# Patient Record
Sex: Female | Born: 1935 | Race: White | Hispanic: No | Smoking: Current every day smoker
Health system: Southern US, Community
[De-identification: ages and names within clinical notes are randomized; demographics above are authoritative.]

## PROBLEM LIST (undated history)

## (undated) DIAGNOSIS — E119 Type 2 diabetes mellitus without complications: Secondary | ICD-10-CM

## (undated) DIAGNOSIS — I10 Essential (primary) hypertension: Secondary | ICD-10-CM

## (undated) DIAGNOSIS — J449 Chronic obstructive pulmonary disease, unspecified: Secondary | ICD-10-CM

## (undated) DIAGNOSIS — E7800 Pure hypercholesterolemia, unspecified: Secondary | ICD-10-CM

## (undated) DIAGNOSIS — I2510 Atherosclerotic heart disease of native coronary artery without angina pectoris: Secondary | ICD-10-CM

## (undated) DIAGNOSIS — C801 Malignant (primary) neoplasm, unspecified: Secondary | ICD-10-CM

## (undated) HISTORY — DX: Chronic obstructive pulmonary disease, unspecified: J44.9

## (undated) HISTORY — PX: BREAST SURGERY: SHX581

## (undated) HISTORY — PX: ABDOMINAL HYSTERECTOMY: SHX81

## (undated) HISTORY — DX: Atherosclerotic heart disease of native coronary artery without angina pectoris: I25.10

## (undated) HISTORY — PX: EYE SURGERY: SHX253

## (undated) HISTORY — PX: CARDIAC SURGERY: SHX584

---

## 2019-07-08 ENCOUNTER — Encounter: Payer: Self-pay | Admitting: Emergency Medicine

## 2019-07-08 ENCOUNTER — Other Ambulatory Visit: Payer: Self-pay

## 2019-07-08 ENCOUNTER — Ambulatory Visit
Admission: EM | Admit: 2019-07-08 | Discharge: 2019-07-08 | Disposition: A | Discharge status: Home / Self Care | Payer: Medicare PPO | Attending: Family Medicine | Admitting: Family Medicine

## 2019-07-08 DIAGNOSIS — Z760 Encounter for issue of repeat prescription: Secondary | ICD-10-CM

## 2019-07-08 DIAGNOSIS — L989 Disorder of the skin and subcutaneous tissue, unspecified: Secondary | ICD-10-CM

## 2019-07-08 HISTORY — DX: Pure hypercholesterolemia, unspecified: E78.00

## 2019-07-08 HISTORY — DX: Essential (primary) hypertension: I10

## 2019-07-08 HISTORY — DX: Malignant (primary) neoplasm, unspecified: C80.1

## 2019-07-08 HISTORY — DX: Type 2 diabetes mellitus without complications: E11.9

## 2019-07-08 MED ORDER — LISINOPRIL 10 MG PO TABS: 10.0000 mg | ORAL_TABLET | Freq: Every day | ORAL | 1 refills | Status: DC

## 2019-07-08 MED ORDER — METFORMIN HCL 1000 MG PO TABS: 1000.0000 mg | ORAL_TABLET | Freq: Two times a day (BID) | ORAL | 1 refills | Status: DC

## 2019-07-08 MED ORDER — LOVASTATIN 40 MG PO TABS: 40.0000 mg | ORAL_TABLET | Freq: Every day | ORAL | 1 refills | Status: DC

## 2019-07-08 MED ORDER — ALBUTEROL SULFATE HFA 108 (90 BASE) MCG/ACT IN AERS: 2.0000 | INHALATION_SPRAY | Freq: Four times a day (QID) | RESPIRATORY_TRACT | 3 refills | Status: DC | PRN

## 2019-07-08 MED ORDER — METOPROLOL TARTRATE 25 MG PO TABS: 25.0000 mg | ORAL_TABLET | Freq: Two times a day (BID) | ORAL | 1 refills | Status: DC

## 2019-07-08 MED ORDER — CLOPIDOGREL BISULFATE 75 MG PO TABS: 75.0000 mg | ORAL_TABLET | Freq: Every day | ORAL | 1 refills | Status: DC

## 2019-07-08 NOTE — ED Triage Notes (Signed)
Patient states that her nose started bleeding this morning and won't stop bleeding.  Patient states that she recently moved here and needs refill on all of her medications.

## 2019-07-08 NOTE — ED Provider Notes (Signed)
[facility]    CSN: [id number] Arrival date & time: 07/08/19  1356      History   Chief Complaint Chief Complaint  Patient presents with  . Epistaxis  . Medication Refill   HPI   85 year old female presents for medication refill.  Patient also complains of a bleeding on the top of her left nostril.  Area on the top of her nose on the left side began bleeding this morning.  She states that it has not stopped.  She has a Band-Aid on it currently.  Patient states that she has recently relocated to area.  She is in need of refill on her medications.  She is running out.  She is otherwise feeling well.  No other associated symptoms.  No other complaints.  Past Medical History:  Diagnosis Date  . Cancer ([place])   . Diabetes mellitus without complication ([place])   . Hypercholesterolemia   . Hypertension    Past Surgical History:  Procedure Laterality Date  . ABDOMINAL HYSTERECTOMY    . BREAST SURGERY    . CARDIAC SURGERY    . EYE SURGERY     OB History   No obstetric history on file.    Home Medications    Prior to Admission medications   Medication Sig Start Date End Date Taking? Authorizing Provider  OXYGEN Inhale into the lungs at bedtime.   Yes [provider]  albuterol (VENTOLIN HFA) 108 (90 Base) MCG/ACT inhaler Inhale 2 puffs into the lungs every 6 (six) hours as needed for wheezing or shortness of breath. 07/08/19   Coral Spikes, DO  clopidogrel (PLAVIX) 75 MG tablet Take 1 tablet (75 mg total) by mouth daily. 07/08/19   Coral Spikes, DO  lisinopril (ZESTRIL) 10 MG tablet Take 1 tablet (10 mg total) by mouth daily. 07/08/19   Coral Spikes, DO  lovastatin (MEVACOR) 40 MG tablet Take 1 tablet (40 mg total) by mouth at bedtime. 07/08/19   Coral Spikes, DO  metFORMIN (GLUCOPHAGE) 1000 MG tablet Take 1 tablet (1,000 mg total) by mouth 2 (two) times daily with a meal. 07/08/19   Coral Spikes, DO  metoprolol tartrate (LOPRESSOR) 25 MG tablet Take 1 tablet (25  mg total) by mouth 2 (two) times daily. 07/08/19   Coral Spikes, DO    Family History History reviewed. No pertinent family history.  Social History Social History   Tobacco Use  . Smoking status: Current Every Day Smoker    Types: Cigarettes  . Smokeless tobacco: Never Used  Substance Use Topics  . Alcohol use: Not Currently  . Drug use: Never     Allergies   Patient has no known allergies.   Review of Systems Review of Systems  Constitutional: Negative.   Skin:       Bleeding - left nostril (not inside the nose).   Physical Exam Triage Vital Signs ED Triage Vitals  Enc Vitals Group     BP 07/08/19 1420 (!) 211/66     Pulse Rate 07/08/19 1420 64     Resp 07/08/19 1420 16     Temp 07/08/19 1420 99.1 F (37.3 C)     Temp Source 07/08/19 1420 Oral     SpO2 07/08/19 1420 100 %     Weight 07/08/19 1415 150 lb (68 kg)     Height 07/08/19 1415 5' 5.5" (1.664 m)     Head Circumference --      Peak Flow --  Pain Score 07/08/19 1415 0     Pain Loc --      Pain Edu? --      Excl. in [place]? --    Updated Vital Signs BP (!) 211/66 (BP Location: Left Arm) Comment: Patient states that she ran out of Lisinopril and did not take it today.  Pulse 64   Temp 99.1 F (37.3 C) (Oral)   Resp 16   Ht 5' 5.5" (1.664 m)   Wt 68 kg   SpO2 100%   BMI 24.58 kg/m   Visual Acuity Right Eye Distance:   Left Eye Distance:   Bilateral Distance:    Right Eye Near:   Left Eye Near:    Bilateral Near:     Physical Exam Vitals and nursing note reviewed.  Constitutional:      General: She is not in acute distress.    Appearance: Normal appearance. She is ill-appearing.  HENT:     Head: Normocephalic and atraumatic.     Nose:      Comments: Right skin lesion noted.  No current bleeding. Eyes:     General:        Right eye: No discharge.        Left eye: No discharge.     Conjunctiva/sclera: Conjunctivae normal.  Cardiovascular:     Rate and Rhythm: Normal rate and  regular rhythm.  Pulmonary:     Breath sounds: Wheezing present.  Neurological:     Mental Status: She is alert.  Psychiatric:        Mood and Affect: Mood normal.        Behavior: Behavior normal.    UC Treatments / Results  Labs (all labs ordered are listed, but only abnormal results are displayed) Labs Reviewed - No data to display  EKG   Radiology No results found.  Procedures Procedures (including critical care time)  Medications Ordered in UC Medications - No data to display  Initial Impression / Assessment and Plan / UC Course  I have reviewed the triage vital signs and the nursing notes.  Pertinent labs & imaging results that were available during my care of the patient were reviewed by me and considered in my medical decision making (see chart for details).    85 year old female presents with a oblique skin lesion and is in need of medication refill.  No current bleeding.  Advised to establish care.  Medications refilled as below.  Final Clinical Impressions(s) / UC Diagnoses   Final diagnoses:  Skin lesion  Encounter for medication refill     Discharge Instructions     I have refilled your medications.  Contact one of these local providers to establish care - [place] - [phone]; [place] - [phone]; [facility] - [phone]; [place] - [phone].  Take care  Dr. Lacinda Axon    ED Prescriptions    Medication Sig Dispense Auth. Provider   albuterol (VENTOLIN HFA) 108 (90 Base) MCG/ACT inhaler Inhale 2 puffs into the lungs every 6 (six) hours as needed for wheezing or shortness of breath. 18 g Glayds Insco G, DO   clopidogrel (PLAVIX) 75 MG tablet Take 1 tablet (75 mg total) by mouth daily. 90 tablet Tasmia Blumer G, DO   lisinopril (ZESTRIL) 10 MG tablet Take 1 tablet (10 mg total) by mouth daily. 90 tablet Keymora Grillot G, DO   lovastatin (MEVACOR) 40 MG tablet Take 1 tablet (40 mg total) by mouth  at  bedtime. 90 tablet Rianna Lukes G, DO   metFORMIN (GLUCOPHAGE) 1000 MG tablet Take 1 tablet (1,000 mg total) by mouth 2 (two) times daily with a meal. 180 tablet Aliya Sol G, DO   metoprolol tartrate (LOPRESSOR) 25 MG tablet Take 1 tablet (25 mg total) by mouth 2 (two) times daily. 180 tablet Coral Spikes, DO     PDMP not reviewed this encounter.   Coral Spikes, DO 07/08/19 1450

## 2019-07-08 NOTE — Discharge Instructions (Signed)
I have refilled your medications.  Contact one of these local providers to establish care - [place] - [phone]; [place] - [phone]; [facility] - [phone]; [place] - [phone].  Take care  Dr. Lacinda Axon

## 2020-01-02 ENCOUNTER — Other Ambulatory Visit: Payer: Self-pay | Admitting: Family Medicine

## 2020-01-04 ENCOUNTER — Ambulatory Visit
Admission: EM | Admit: 2020-01-04 | Discharge: 2020-01-04 | Disposition: A | Discharge status: Home / Self Care | Payer: Medicare PPO | Attending: Physician Assistant | Admitting: Physician Assistant

## 2020-01-04 ENCOUNTER — Encounter: Payer: Self-pay | Admitting: Emergency Medicine

## 2020-01-04 ENCOUNTER — Other Ambulatory Visit: Payer: Self-pay | Admitting: Family Medicine

## 2020-01-04 ENCOUNTER — Other Ambulatory Visit: Payer: Self-pay

## 2020-01-04 DIAGNOSIS — E119 Type 2 diabetes mellitus without complications: Secondary | ICD-10-CM

## 2020-01-04 DIAGNOSIS — I10 Essential (primary) hypertension: Secondary | ICD-10-CM | POA: Diagnosis not present

## 2020-01-04 DIAGNOSIS — E785 Hyperlipidemia, unspecified: Secondary | ICD-10-CM

## 2020-01-04 MED ORDER — METOPROLOL TARTRATE 25 MG PO TABS: 25.0000 mg | ORAL_TABLET | Freq: Two times a day (BID) | ORAL | 0 refills | Status: DC

## 2020-01-04 MED ORDER — LOVASTATIN 40 MG PO TABS: 40.0000 mg | ORAL_TABLET | Freq: Every day | ORAL | 0 refills | Status: DC

## 2020-01-04 MED ORDER — METFORMIN HCL 1000 MG PO TABS: 1000.0000 mg | ORAL_TABLET | Freq: Two times a day (BID) | ORAL | 0 refills | Status: DC

## 2020-01-04 MED ORDER — LISINOPRIL 10 MG PO TABS: 10.0000 mg | ORAL_TABLET | Freq: Every day | ORAL | 0 refills | Status: DC

## 2020-01-04 MED ORDER — CLOPIDOGREL BISULFATE 75 MG PO TABS: 75.0000 mg | ORAL_TABLET | Freq: Every day | ORAL | 0 refills | Status: AC

## 2020-01-04 NOTE — Discharge Instructions (Addendum)
I have refilled your medications today for a 67-month supply.  It is imperative that you find a primary care provider as soon as possible.  We have provided you with 90-day supply of medications in March of this year.  We cannot provide you any more medication refills after today.  You are on multiple medications that require close follow-up. You take medications for diabetes, hypertension, and hyperlipidemia.  You are unsure of the last time he had lab work done and it is important to have your blood sugar and blood cholesterol checked.  Also your blood pressure does not seem to be managed well so your medication may need to be changed.  We cannot do that.  That is why you need a PCP.  Also you have some wheezing on auscultation of your lungs.  You should have follow-up about this since you state you are unsure if you have asthma or if it is due to some other reason.  Other than your blood pressure being high, your vitals are good and your exam is normal.  You should try to find a PCP starting next week.  Go to the emergency department for any emergencies such as chest pain, shortness of breath, weakness, dizziness, severe headaches, etc.  [facility] at [facility], [address], [address]. Phone number: [phone] Please call this number to establish with primary care

## 2020-01-04 NOTE — ED Triage Notes (Signed)
Patient does not have a primary physician.  Patient needs refill on all her medications today.

## 2020-01-04 NOTE — ED Provider Notes (Signed)
[facility]    CSN: [id number] Arrival date & time: 01/04/20  1235      History   Chief Complaint Chief Complaint  Patient presents with  . Medication Refill    HPI Beth Franco is a 85 y.o. female.   85 year old female presents requesting multiple medication refills today.  She says she does not have a primary care provider because someone in her family might be getting a transfer to a different location so she might be moving and does not want to establish.  She admits to being seen at the [facility] urgent care 6 months ago and was prescribed 90-day supply of medication.  She says she is due to run out of her medications in a few days and does not want to run out.  Patient medical history significant for hypertension, hyperlipidemia, and diabetes type 2.  She also has an albuterol inhaler for wheezing but does not know why she has the wheezing.  She admits that it has been a while since she has had any labs checked.  She says she feels great today.  She denies fatigue, headaches, chest pain, shortness of breath, or weakness.  No other concerns today.     Past Medical History:  Diagnosis Date  . Cancer ([place])   . Diabetes mellitus without complication ([place])   . Hypercholesterolemia   . Hypertension     There are no problems to display for this patient.   Past Surgical History:  Procedure Laterality Date  . ABDOMINAL HYSTERECTOMY    . BREAST SURGERY    . CARDIAC SURGERY    . EYE SURGERY      OB History   No obstetric history on file.      Home Medications    Prior to Admission medications   Medication Sig Start Date End Date Taking? Authorizing Provider  albuterol (VENTOLIN HFA) 108 (90 Base) MCG/ACT inhaler Inhale 2 puffs into the lungs every 6 (six) hours as needed for wheezing or shortness of breath. 07/08/19   Coral Spikes, DO  clopidogrel (PLAVIX) 75 MG tablet Take 1 tablet (75 mg total) by mouth daily. 01/04/20 02/03/20  Laurene Footman B, PA-C    lisinopril (ZESTRIL) 10 MG tablet Take 1 tablet (10 mg total) by mouth daily. 01/04/20 02/03/20  Danton Clap, PA-C  lovastatin (MEVACOR) 40 MG tablet Take 1 tablet (40 mg total) by mouth at bedtime. 01/04/20 02/03/20  Laurene Footman B, PA-C  metFORMIN (GLUCOPHAGE) 1000 MG tablet Take 1 tablet (1,000 mg total) by mouth 2 (two) times daily with a meal. 01/04/20 02/03/20  Laurene Footman B, PA-C  metoprolol tartrate (LOPRESSOR) 25 MG tablet Take 1 tablet (25 mg total) by mouth 2 (two) times daily. 01/04/20 02/03/20  Laurene Footman B, PA-C  OXYGEN Inhale into the lungs at bedtime.    [provider]    Family History History reviewed. No pertinent family history.  Social History Social History   Tobacco Use  . Smoking status: Current Every Day Smoker    Types: Cigarettes  . Smokeless tobacco: Never Used  Vaping Use  . Vaping Use: Never used  Substance Use Topics  . Alcohol use: Not Currently  . Drug use: Never     Allergies   Patient has no known allergies.   Review of Systems Review of Systems  Constitutional: Negative for fatigue and fever.  Respiratory: Negative for cough, chest tightness, shortness of breath and wheezing.   Cardiovascular: Negative for chest pain, palpitations  and leg swelling.  Gastrointestinal: Negative for abdominal pain, diarrhea and vomiting.  Musculoskeletal: Negative for myalgias.  Neurological: Negative for weakness, light-headedness and headaches.     Physical Exam Triage Vital Signs ED Triage Vitals  Enc Vitals Group     BP 01/04/20 1309 (!) 174/59     Pulse Rate 01/04/20 1309 63     Resp 01/04/20 1309 15     Temp 01/04/20 1309 98.8 F (37.1 C)     Temp Source 01/04/20 1309 Oral     SpO2 01/04/20 1309 98 %     Weight 01/04/20 1306 150 lb (68 kg)     Height 01/04/20 1306 5\' 6"  (1.676 m)     Head Circumference --      Peak Flow --      Pain Score 01/04/20 1306 0     Pain Loc --      Pain Edu? --      Excl. in [place]? --    No data  found.  Updated Vital Signs BP (!) 174/59 (BP Location: Right Arm)   Pulse 63   Temp 98.8 F (37.1 C) (Oral)   Resp 15   Ht 5\' 6"  (1.676 m)   Wt 150 lb (68 kg)   SpO2 98%   BMI 24.21 kg/m    Physical Exam Vitals and nursing note reviewed.  Constitutional:      General: She is not in acute distress.    Appearance: Normal appearance. She is normal weight. She is not ill-appearing or toxic-appearing.  HENT:     Head: Normocephalic and atraumatic.  Eyes:     General: No scleral icterus.       Right eye: No discharge.        Left eye: No discharge.     Conjunctiva/sclera: Conjunctivae normal.  Cardiovascular:     Rate and Rhythm: Normal rate and regular rhythm.     Heart sounds: Normal heart sounds.  Pulmonary:     Effort: Pulmonary effort is normal. No respiratory distress.     Breath sounds: Wheezing (few scattered expiratory wheezes throughout) present.  Musculoskeletal:     Cervical back: Neck supple.  Skin:    General: Skin is dry.  Neurological:     General: No focal deficit present.     Mental Status: She is alert and oriented to person, place, and time. Mental status is at baseline.     Motor: No weakness.  Psychiatric:        Mood and Affect: Mood normal.        Behavior: Behavior normal.        Thought Content: Thought content normal.      UC Treatments / Results  Labs (all labs ordered are listed, but only abnormal results are displayed) Labs Reviewed - No data to display  EKG   Radiology No results found.  Procedures Procedures (including critical care time)  Medications Ordered in UC Medications - No data to display  Initial Impression / Assessment and Plan / UC Course  I have reviewed the triage vital signs and the nursing notes.  Pertinent labs & imaging results that were available during my care of the patient were reviewed by me and considered in my medical decision making (see chart for details).    Advised patient of the following:    I have refilled your medications today for a 80-month supply.  It is imperative that you find a primary care provider as soon as possible.  We have provided you with 90-day supply of medications in March of this year.  We cannot provide you any more medication refills after today.  You are on multiple medications that require close follow-up. You take medications for diabetes, hypertension, and hyperlipidemia.  You are unsure of the last time he had lab work done and it is important to have your blood sugar and blood cholesterol checked.  Also your blood pressure does not seem to be managed well so your medication may need to be changed.  We cannot do that.  That is why you need a PCP.  Also you have some wheezing on auscultation of your lungs.  You should have follow-up about this since you state you are unsure if you have asthma or if it is due to some other reason.  Other than your blood pressure being high, your vitals are good and your exam is normal.  You should try to find a PCP starting next week.  Go to the emergency department for any emergencies such as chest pain, shortness of breath, weakness, dizziness, unexpected weight changes or leg swelling, severe headaches, numbness/weakness/tingling, etc.  [facility] at [facility], [address], [address]. Phone number: [phone] Please call this number to establish with primary care   *Of note, Dr. Lacinda Axon provided patient with 90-day supply of all of these medications previously and gave her multiple contact numbers to establish with primary care.  Final Clinical Impressions(s) / UC Diagnoses   Final diagnoses:  Essential hypertension  Hyperlipidemia, unspecified hyperlipidemia type  Type 2 diabetes mellitus without complication, without long-term current use of insulin ([place])     Discharge Instructions     I have refilled your medications today for a 71-month supply.  It is imperative that you find a primary care  provider as soon as possible.  We have provided you with 90-day supply of medications in March of this year.  We cannot provide you any more medication refills after today.  You are on multiple medications that require close follow-up. You take medications for diabetes, hypertension, and hyperlipidemia.  You are unsure of the last time he had lab work done and it is important to have your blood sugar and blood cholesterol checked.  Also your blood pressure does not seem to be managed well so your medication may need to be changed.  We cannot do that.  That is why you need a PCP.  Also you have some wheezing on auscultation of your lungs.  You should have follow-up about this since you state you are unsure if you have asthma or if it is due to some other reason.  Other than your blood pressure being high, your vitals are good and your exam is normal.  You should try to find a PCP starting next week.  Go to the emergency department for any emergencies such as chest pain, shortness of breath, weakness, dizziness, severe headaches, etc.  [facility] at [facility], [address], [address]. Phone number: [phone] Please call this number to establish with primary care     ED Prescriptions    Medication Sig Dispense Auth. Provider   clopidogrel (PLAVIX) 75 MG tablet Take 1 tablet (75 mg total) by mouth daily. 30 tablet Laurene Footman B, PA-C   lisinopril (ZESTRIL) 10 MG tablet Take 1 tablet (10 mg total) by mouth daily. 30 tablet Laurene Footman B, PA-C   metFORMIN (GLUCOPHAGE) 1000 MG tablet Take 1 tablet (1,000 mg total)  by mouth 2 (two) times daily with a meal. 60 tablet Laurene Footman B, PA-C   lovastatin (MEVACOR) 40 MG tablet Take 1 tablet (40 mg total) by mouth at bedtime. 30 tablet Laurene Footman B, PA-C   metoprolol tartrate (LOPRESSOR) 25 MG tablet Take 1 tablet (25 mg total) by mouth 2 (two) times daily. 60 tablet Gretta Cool     PDMP not reviewed this encounter.    Danton Clap, PA-C 01/04/20 1354

## 2020-01-08 ENCOUNTER — Other Ambulatory Visit: Payer: Self-pay | Admitting: General Practice

## 2020-01-08 NOTE — Telephone Encounter (Signed)
Dr.jones office will put patient on cancelation list to see if they can get her in before medication runs out. Patient is aware

## 2020-01-08 NOTE — Telephone Encounter (Signed)
Requested medication (s) are due for refill today: no  Requested medication (s) are on the active medication list: yes   Last refill:  02/12/2020  Future visit scheduled: yes   Notes to clinic:  patient has upcoming appointment  Please review medications for refill    Requested Prescriptions  Pending Prescriptions Disp Refills   metoprolol tartrate (LOPRESSOR) 25 MG tablet 60 tablet 0    Sig: Take 1 tablet (25 mg total) by mouth 2 (two) times daily.      Cardiovascular:  Beta Blockers Failed - 01/08/2020  1:07 PM      Failed - Last BP in normal range    BP Readings from Last 1 Encounters:  01/04/20 (!) 174/59          Failed - Valid encounter within last 6 months    Recent Outpatient Visits   None     Future Appointments             In 1 month Juline Patch, MD [facility], [facility]            Passed - Last Heart Rate in normal range    Pulse Readings from Last 1 Encounters:  01/04/20 63            metFORMIN (GLUCOPHAGE) 1000 MG tablet 60 tablet 0    Sig: Take 1 tablet (1,000 mg total) by mouth 2 (two) times daily with a meal.      Endocrinology:  Diabetes - Biguanides Failed - 01/08/2020  1:07 PM      Failed - Cr in normal range and within 360 days    No results found for: CREATININE, LABCREAU, LABCREA, POCCRE        Failed - HBA1C is between 0 and 7.9 and within 180 days    No results found for: HGBA1C, LABA1C        Failed - eGFR in normal range and within 360 days    No results found for: GFRAA, GFRNONAA, GFR, EGFR        Failed - Valid encounter within last 6 months    Recent Outpatient Visits   None     Future Appointments             In 1 month Juline Patch, MD [facility], [facility]              lovastatin (MEVACOR) 40 MG tablet 30 tablet 0    Sig: Take 1 tablet (40 mg total) by mouth at bedtime.      Cardiovascular:  Antilipid - Statins Failed - 01/08/2020  1:07 PM      Failed - Total Cholesterol in normal range and  within 360 days    No results found for: CHOL, POCCHOL, CHOLTOT        Failed - LDL in normal range and within 360 days    No results found for: LDLCALC, LDLC, HIRISKLDL, POCLDL, LDLDIRECT, REALLDLC, TOTLDLC        Failed - HDL in normal range and within 360 days    No results found for: HDL, POCHDL        Failed - Triglycerides in normal range and within 360 days    No results found for: TRIG, POCTRIG        Failed - Valid encounter within last 12 months    Recent Outpatient Visits   None     Future Appointments  In 1 month Juline Patch, MD [facility], [place] - Patient is not pregnant        lisinopril (ZESTRIL) 10 MG tablet 30 tablet 0    Sig: Take 1 tablet (10 mg total) by mouth daily.      Cardiovascular:  ACE Inhibitors Failed - 01/08/2020  1:07 PM      Failed - Cr in normal range and within 180 days    No results found for: CREATININE, LABCREAU, LABCREA, POCCRE        Failed - K in normal range and within 180 days    No results found for: K, POTASSIUM, POCK        Failed - Last BP in normal range    BP Readings from Last 1 Encounters:  01/04/20 (!) 174/59          Failed - Valid encounter within last 6 months    Recent Outpatient Visits   None     Future Appointments             In 1 month Juline Patch, MD [facility], [place] - Patient is not pregnant        clopidogrel (PLAVIX) 75 MG tablet 30 tablet 0    Sig: Take 1 tablet (75 mg total) by mouth daily.      Hematology: Antiplatelets - clopidogrel Failed - 01/08/2020  1:07 PM      Failed - Evaluate AST, ALT within 2 months of therapy initiation.      Failed - ALT in normal range and within 360 days    No results found for: ALT, LABALT, POCALT        Failed - AST in normal range and within 360 days    No results found for: POCAST, AST        Failed - HCT in normal range and within 180 days    No results found for:  HCT, HCTKUC, SRHCT        Failed - HGB in normal range and within 180 days    No results found for: HGB, HGBKUC, HGBPOCKUC, HGBOTHER, TOTHGB, HGBPLASMA        Failed - PLT in normal range and within 180 days    No results found for: PLT, PLTCOUNTKUC, LABPLAT, POCPLA        Failed - Valid encounter within last 6 months    Recent Outpatient Visits   None     Future Appointments             In 1 month Juline Patch, MD [facility], [place]

## 2020-01-08 NOTE — Telephone Encounter (Signed)
Patient scheduled NPA with Dr. Ronnald Ramp for 02/12/2020 at 2:20pm. Patient was receiving her medication from [facility] urgent care and was advised no further refills would be granted and to schedule a NPA with a PCP.  Patient will run out of her medication at the end of September and would like a courtesy refill or would like to be worked in sooner then October.   [place], [place] Phone:  [phone]  Fax:  [phone]

## 2020-01-08 NOTE — Telephone Encounter (Signed)
Hi  We have not establish care with [facility], she will become a new patient 02/12/2020. If she needs refills she will need to be seen in urgent care to have them prescribes those for her, or she will need to contact her previous PCP.

## 2020-01-29 ENCOUNTER — Ambulatory Visit: Payer: Medicare PPO | Admitting: Family Medicine

## 2020-02-12 ENCOUNTER — Encounter: Payer: Self-pay | Admitting: Family Medicine

## 2020-02-12 ENCOUNTER — Other Ambulatory Visit: Payer: Self-pay

## 2020-02-12 ENCOUNTER — Ambulatory Visit: Payer: Medicare PPO | Admitting: Family Medicine

## 2020-02-12 VITALS — BP 150/70 | HR 72 | Ht 66.0 in | Wt 166.0 lb

## 2020-02-12 DIAGNOSIS — E78019 Familial hypercholesterolemia, unspecified: Secondary | ICD-10-CM

## 2020-02-12 DIAGNOSIS — I679 Cerebrovascular disease, unspecified: Secondary | ICD-10-CM | POA: Diagnosis not present

## 2020-02-12 DIAGNOSIS — F17210 Nicotine dependence, cigarettes, uncomplicated: Secondary | ICD-10-CM

## 2020-02-12 DIAGNOSIS — Z7689 Persons encountering health services in other specified circumstances: Secondary | ICD-10-CM

## 2020-02-12 DIAGNOSIS — J449 Chronic obstructive pulmonary disease, unspecified: Secondary | ICD-10-CM | POA: Diagnosis not present

## 2020-02-12 DIAGNOSIS — E7801 Familial hypercholesterolemia: Secondary | ICD-10-CM

## 2020-02-12 DIAGNOSIS — I10 Essential (primary) hypertension: Secondary | ICD-10-CM

## 2020-02-12 DIAGNOSIS — I2510 Atherosclerotic heart disease of native coronary artery without angina pectoris: Secondary | ICD-10-CM | POA: Diagnosis not present

## 2020-02-12 DIAGNOSIS — E119 Type 2 diabetes mellitus without complications: Secondary | ICD-10-CM

## 2020-02-12 MED ORDER — ALBUTEROL SULFATE HFA 108 (90 BASE) MCG/ACT IN AERS: 2.0000 | INHALATION_SPRAY | Freq: Four times a day (QID) | RESPIRATORY_TRACT | 0 refills | Status: AC | PRN

## 2020-02-12 MED ORDER — LOVASTATIN 40 MG PO TABS: 40.0000 mg | ORAL_TABLET | Freq: Every day | ORAL | 0 refills | Status: DC

## 2020-02-12 MED ORDER — METFORMIN HCL 1000 MG PO TABS: 1000.0000 mg | ORAL_TABLET | Freq: Two times a day (BID) | ORAL | 0 refills | Status: DC

## 2020-02-12 MED ORDER — METOPROLOL TARTRATE 25 MG PO TABS: 25.0000 mg | ORAL_TABLET | Freq: Two times a day (BID) | ORAL | 0 refills | Status: DC

## 2020-02-12 MED ORDER — LISINOPRIL 10 MG PO TABS: 10.0000 mg | ORAL_TABLET | Freq: Every day | ORAL | 0 refills | Status: DC

## 2020-02-12 MED ORDER — CLOPIDOGREL BISULFATE 75 MG PO TABS: 75.0000 mg | ORAL_TABLET | Freq: Every day | ORAL | 0 refills | Status: DC

## 2020-02-12 NOTE — Patient Instructions (Signed)
Mediterranean Diet A Mediterranean diet refers to food and lifestyle choices that are based on the traditions of countries located on the [place]. This way of eating has been shown to help prevent certain conditions and improve outcomes for people who have chronic diseases, like kidney disease and heart disease. What are tips for following this plan? Lifestyle  Cook and eat meals together with your family, when possible.  Drink enough fluid to keep your urine clear or pale yellow.  Be physically active every day. This includes: ? Aerobic exercise like running or swimming. ? Leisure activities like gardening, walking, or housework.  Get 7-8 hours of sleep each night.  If recommended by your health care provider, drink red wine in moderation. This means 1 glass a day for nonpregnant women and 2 glasses a day for men. A glass of wine equals 5 oz (150 mL). Reading food labels  Check the serving size of packaged foods. For foods such as rice and pasta, the serving size refers to the amount of cooked product, not dry.  Check the total fat in packaged foods. Avoid foods that have saturated fat or trans fats.  Check the ingredients list for added sugars, such as corn syrup.   Shopping  At the grocery store, buy most of your food from the areas near the walls of the store. This includes: ? Fresh fruits and vegetables (produce). ? Grains, beans, nuts, and seeds. Some of these may be available in unpackaged forms or large amounts (in bulk). ? Fresh seafood. ? Poultry and eggs. ? Low-fat dairy products.  Buy whole ingredients instead of prepackaged foods.  Buy fresh fruits and vegetables in-season from local farmers markets.  Buy frozen fruits and vegetables in resealable bags.  If you do not have access to quality fresh seafood, buy precooked frozen shrimp or canned fish, such as tuna, salmon, or sardines.  Buy small amounts of raw or cooked vegetables, salads, or olives from  the deli or salad bar at your store.  Stock your pantry so you always have certain foods on hand, such as olive oil, canned tuna, canned tomatoes, rice, pasta, and beans. Cooking  Cook foods with extra-virgin olive oil instead of using butter or other vegetable oils.  Have meat as a side dish, and have vegetables or grains as your main dish. This means having meat in small portions or adding small amounts of meat to foods like pasta or stew.  Use beans or vegetables instead of meat in common dishes like chili or lasagna.  Experiment with different cooking methods. Try roasting or broiling vegetables instead of steaming or sauteing them.  Add frozen vegetables to soups, stews, pasta, or rice.  Add nuts or seeds for added healthy fat at each meal. You can add these to yogurt, salads, or vegetable dishes.  Marinate fish or vegetables using olive oil, lemon juice, garlic, and fresh herbs. Meal planning  Plan to eat 1 vegetarian meal one day each week. Try to work up to 2 vegetarian meals, if possible.  Eat seafood 2 or more times a week.  Have healthy snacks readily available, such as: ? Vegetable sticks with hummus. ? Greek yogurt. ? Fruit and nut trail mix.  Eat balanced meals throughout the week. This includes: ? Fruit: 2-3 servings a day ? Vegetables: 4-5 servings a day ? Low-fat dairy: 2 servings a day ? Fish, poultry, or lean meat: 1 serving a day ? Beans and legumes: 2 or more servings a week ?   Nuts and seeds: 1-2 servings a day ? Whole grains: 6-8 servings a day ? Extra-virgin olive oil: 3-4 servings a day  Limit red meat and sweets to only a few servings a month   What are my food choices?  Mediterranean diet ? Recommended  Grains: Whole-grain pasta. Brown rice. Bulgar wheat. Polenta. Couscous. Whole-wheat bread. Oatmeal. Quinoa.  Vegetables: Artichokes. Beets. Broccoli. Cabbage. Carrots. Eggplant. Green beans. Chard. Kale. Spinach. Onions. Leeks. Peas. Squash.  Tomatoes. Peppers. Radishes.  Fruits: Apples. Apricots. Avocado. Berries. Bananas. Cherries. Dates. Figs. Grapes. Lemons. Melon. Oranges. Peaches. Plums. Pomegranate.  Meats and other protein foods: Beans. Almonds. Sunflower seeds. Pine nuts. Peanuts. Cod. Salmon. Scallops. Shrimp. Tuna. Tilapia. Clams. Oysters. Eggs.  Dairy: Low-fat milk. Cheese. Greek yogurt.  Beverages: Water. Red wine. Herbal tea.  Fats and oils: Extra virgin olive oil. Avocado oil. Grape seed oil.  Sweets and desserts: Greek yogurt with honey. Baked apples. Poached pears. Trail mix.  Seasoning and other foods: Basil. Cilantro. Coriander. Cumin. Mint. Parsley. Sage. Rosemary. Tarragon. Garlic. Oregano. Thyme. Pepper. Balsalmic vinegar. Tahini. Hummus. Tomato sauce. Olives. Mushrooms. ? Limit these  Grains: Prepackaged pasta or rice dishes. Prepackaged cereal with added sugar.  Vegetables: Deep fried potatoes (french fries).  Fruits: Fruit canned in syrup.  Meats and other protein foods: Beef. Pork. Lamb. Poultry with skin. Hot dogs. Bacon.  Dairy: Ice cream. Sour cream. Whole milk.  Beverages: Juice. Sugar-sweetened soft drinks. Beer. Liquor and spirits.  Fats and oils: Butter. Canola oil. Vegetable oil. Beef fat (tallow). Lard.  Sweets and desserts: Cookies. Cakes. Pies. Candy.  Seasoning and other foods: Mayonnaise. Premade sauces and marinades. The items listed may not be a complete list. Talk with your dietitian about what dietary choices are right for you. Summary  The Mediterranean diet includes both food and lifestyle choices.  Eat a variety of fresh fruits and vegetables, beans, nuts, seeds, and whole grains.  Limit the amount of red meat and sweets that you eat.  Talk with your health care provider about whether it is safe for you to drink red wine in moderation. This means 1 glass a day for nonpregnant women and 2 glasses a day for men. A glass of wine equals 5 oz (150 mL). This information  is not intended to replace advice given to you by your health care provider. Make sure you discuss any questions you have with your health care provider. Document Revised: 12/18/2015 Document Reviewed: 12/11/2015 Elsevier Patient Education  2020 [employer]  

## 2020-02-12 NOTE — Progress Notes (Signed)
Date:  02/12/2020   Name:  Beth Franco   DOB:  1934/05/22   MRN:  [id number]   Chief Complaint: Establish Care, Hyperlipidemia, Hypertension, Diabetes, Coronary Artery Disease, and COPD  Hyperlipidemia This is a chronic problem. The current episode started more than 1 year ago. The problem is controlled. Recent lipid tests were reviewed and are normal. She has no history of chronic renal disease, diabetes, hypothyroidism, liver disease, obesity or nephrotic syndrome. There are no known factors aggravating her hyperlipidemia. Pertinent negatives include no chest pain, focal sensory loss, focal weakness, leg pain, myalgias or shortness of breath. Current antihyperlipidemic treatment includes statins. The current treatment provides moderate improvement of lipids. There are no compliance problems.  Risk factors for coronary artery disease include dyslipidemia, hypertension and obesity.  Hypertension This is a chronic problem. The current episode started more than 1 year ago. The problem is controlled. Pertinent negatives include no anxiety, blurred vision, chest pain, headaches, malaise/fatigue, neck pain, orthopnea, palpitations, peripheral edema, PND, shortness of breath or sweats. There are no associated agents to hypertension. Risk factors for coronary artery disease include diabetes mellitus, dyslipidemia, obesity, smoking/tobacco exposure and post-menopausal state. Past treatments include ACE inhibitors. The current treatment provides moderate improvement. There are no compliance problems.  There is no history of angina, kidney disease, CAD/MI, CVA, heart failure, left ventricular hypertrophy, PVD or retinopathy. There is no history of chronic renal disease, a hypertension causing med or renovascular disease.  Diabetes She presents for her follow-up diabetic visit. She has type 2 diabetes mellitus. Her disease course has been stable. There are no hypoglycemic associated symptoms. Pertinent  negatives for hypoglycemia include no dizziness, headaches, nervousness/anxiousness or sweats. Pertinent negatives for diabetes include no blurred vision, no chest pain, no fatigue, no foot paresthesias, no foot ulcerations, no polydipsia, no polyphagia, no polyuria, no visual change, no weakness and no weight loss. There are no hypoglycemic complications. Symptoms are stable. There are no diabetic complications. Pertinent negatives for diabetic complications include no CVA, PVD or retinopathy. Risk factors for coronary artery disease include dyslipidemia and hypertension. Current diabetic treatment includes oral agent (monotherapy). Her weight is stable. She is following a generally healthy diet. Meal planning includes avoidance of concentrated sweets and carbohydrate counting. She participates in exercise daily. Her breakfast blood glucose is taken between 9-10 am. An ACE inhibitor/angiotensin II receptor blocker is being taken.  Coronary Artery Disease Presents for follow-up visit. Pertinent negatives include no chest pain, chest pressure, chest tightness, dizziness, leg swelling, muscle weakness, palpitations, shortness of breath or weight gain. Risk factors include hyperlipidemia and hypertension. Risk factors do not include obesity. The symptoms have been stable.  COPD There is no chest tightness, cough, difficulty breathing, frequent throat clearing, hemoptysis, hoarse voice, shortness of breath, sputum production or wheezing. This is a chronic problem. The problem occurs intermittently. The problem has been gradually improving. Pertinent negatives include no chest pain, ear pain, fever, headaches, malaise/fatigue, myalgias, PND, postnasal drip, rhinorrhea, sneezing, sore throat, sweats or weight loss. She reports minimal improvement on treatment. Her past medical history is significant for COPD. There is no history of asthma, bronchiectasis, bronchitis, emphysema or pneumonia.    No results found for:  CREATININE, BUN, NA, K, CL, CO2 No results found for: CHOL, HDL, LDLCALC, LDLDIRECT, TRIG, CHOLHDL No results found for: TSH No results found for: HGBA1C No results found for: WBC, HGB, HCT, MCV, PLT No results found for: ALT, AST, GGT, ALKPHOS, BILITOT   Review of Systems  Constitutional: Negative.  Negative for chills, fatigue, fever, malaise/fatigue, unexpected weight change, weight gain and weight loss.  HENT: Negative for congestion, ear discharge, ear pain, hoarse voice, postnasal drip, rhinorrhea, sinus pressure, sneezing and sore throat.   Eyes: Negative for blurred vision, photophobia, pain, discharge, redness and itching.  Respiratory: Negative for cough, hemoptysis, sputum production, chest tightness, shortness of breath, wheezing and stridor.   Cardiovascular: Negative for chest pain, palpitations, orthopnea, leg swelling and PND.  Gastrointestinal: Negative for abdominal pain, blood in stool, constipation, diarrhea, nausea and vomiting.  Endocrine: Negative for cold intolerance, heat intolerance, polydipsia, polyphagia and polyuria.  Genitourinary: Negative for dysuria, flank pain, frequency, hematuria, menstrual problem, pelvic pain, urgency, vaginal bleeding and vaginal discharge.  Musculoskeletal: Negative for arthralgias, back pain, myalgias, muscle weakness and neck pain.  Skin: Negative for rash.  Allergic/Immunologic: Negative for environmental allergies and food allergies.  Neurological: Negative for dizziness, focal weakness, weakness, light-headedness, numbness and headaches.  Hematological: Negative for adenopathy. Does not bruise/bleed easily.  Psychiatric/Behavioral: Negative for dysphoric mood. The patient is not nervous/anxious.     There are no problems to display for this patient.   No Known Allergies  Past Surgical History:  Procedure Laterality Date  . ABDOMINAL HYSTERECTOMY    . BREAST SURGERY     lumpectomy  . CARDIAC SURGERY    . EYE SURGERY      cataract    Social History   Tobacco Use  . Smoking status: Current Every Day Smoker    Types: Cigarettes  . Smokeless tobacco: Never Used  Vaping Use  . Vaping Use: Never used  Substance Use Topics  . Alcohol use: Not Currently  . Drug use: Never     Medication list has been reviewed and updated.  Current Meds  Medication Sig  . albuterol (VENTOLIN HFA) 108 (90 Base) MCG/ACT inhaler Inhale 2 puffs into the lungs every 6 (six) hours as needed for wheezing or shortness of breath.  . Calcium 200 MG TABS Take 1 tablet by mouth in the morning and at bedtime.  . clopidogrel (PLAVIX) 75 MG tablet Take 75 mg by mouth daily.  Marland Kitchen lisinopril (ZESTRIL) 10 MG tablet Take 1 tablet (10 mg total) by mouth daily.  Marland Kitchen lovastatin (MEVACOR) 40 MG tablet Take 1 tablet (40 mg total) by mouth at bedtime.  . melatonin 1 MG TABS tablet Take 2 mg by mouth at bedtime.  . metFORMIN (GLUCOPHAGE) 1000 MG tablet Take 1 tablet (1,000 mg total) by mouth 2 (two) times daily with a meal.  . metoprolol tartrate (LOPRESSOR) 25 MG tablet Take 1 tablet (25 mg total) by mouth 2 (two) times daily.  . Multiple Vitamins-Minerals (ONE-A-DAY WOMENS 50+ ADVANTAGE PO) Take 1 tablet by mouth daily.  . OXYGEN Inhale into the lungs at bedtime.    PHQ 2/9 Scores 02/12/2020  PHQ - 2 Score 0  PHQ- 9 Score 1    GAD 7 : Generalized Anxiety Score 02/12/2020  Nervous, Anxious, on Edge 0  Control/stop worrying 0  Worry too much - different things 0  Trouble relaxing 0  Restless 0  Easily annoyed or irritable 0  Afraid - awful might happen 0  Total GAD 7 Score 0    BP Readings from Last 3 Encounters:  02/12/20 (!) 150/70  01/04/20 (!) 174/59  07/08/19 (!) 211/66    Physical Exam Vitals and nursing note reviewed.  Constitutional:      General: She is not in acute distress.  Appearance: She is not diaphoretic.  HENT:     Head: Normocephalic and atraumatic.     Right Ear: External ear normal.     Left Ear:  External ear normal.     Nose: Nose normal.  Eyes:     General:        Right eye: No discharge.        Left eye: No discharge.     Conjunctiva/sclera: Conjunctivae normal.     Pupils: Pupils are equal, round, and reactive to light.  Neck:     Thyroid: No thyromegaly.     Vascular: No JVD.  Cardiovascular:     Rate and Rhythm: Normal rate and regular rhythm.     Heart sounds: Normal heart sounds. No murmur heard.  No friction rub. No gallop.   Pulmonary:     Effort: Pulmonary effort is normal.     Breath sounds: No stridor. Wheezing present. No decreased breath sounds, rhonchi or rales.  Abdominal:     General: Bowel sounds are normal.     Palpations: Abdomen is soft. There is no mass.     Tenderness: There is no abdominal tenderness. There is no guarding.  Musculoskeletal:        General: Normal range of motion.     Cervical back: Normal range of motion and neck supple.  Lymphadenopathy:     Cervical: No cervical adenopathy.  Skin:    General: Skin is warm and dry.  Neurological:     Mental Status: She is alert.     Deep Tendon Reflexes: Reflexes are normal and symmetric.     Wt Readings from Last 3 Encounters:  02/12/20 166 lb (75.3 kg)  01/04/20 150 lb (68 kg)  07/08/19 150 lb (68 kg)    BP (!) 150/70   Pulse 72   Ht 5\' 6"  (1.676 m)   Wt 166 lb (75.3 kg)   BMI 26.79 kg/m   Assessment and Plan: 1. Establishing care with new doctor, encounter for Patient establishing care with new physician.  There was no previous encounters or care everywhere for review.  Patient brings medications with her with the exception of the actual diabetic injections.  There were no labs imaging or consultation elsewhere for review.  2. COPD, mild (HCC) Chronic.  Controlled.  Stable.  Patient has history of COPD with some mild wheezing noted today.  Patient will resume albuterol inhalers 2 puffs every 6 hours until the first frost and then may cut back on the dosing schedule. -  albuterol (VENTOLIN HFA) 108 (90 Base) MCG/ACT inhaler; Inhale 2 puffs into the lungs every 6 (six) hours as needed for wheezing or shortness of breath.  Dispense: 18 g; Refill: 0  3. Coronary artery disease involving native coronary artery of native heart without angina pectoris Chronic.  Controlled.  Stable.  Patient has a stent in the proximal right coronary artery for which she is on Plavix 75 mg daily.  Patient will continue also her metoprolol 25 mg twice a day.  And we will check CBC with platelets. - CBC w/Diff/Platelet - clopidogrel (PLAVIX) 75 MG tablet; Take 1 tablet (75 mg total) by mouth daily.  Dispense: 90 tablet; Refill: 0 - metoprolol tartrate (LOPRESSOR) 25 MG tablet; Take 1 tablet (25 mg total) by mouth 2 (two) times daily.  Dispense: 180 tablet; Refill: 0  4. Cerebrovascular disease, unspecified Chronic.  Controlled.  Stable.  Continue Plavix 75 mg once a day. - CBC w/Diff/Platelet - clopidogrel (PLAVIX)  75 MG tablet; Take 1 tablet (75 mg total) by mouth daily.  Dispense: 90 tablet; Refill: 0  5. Hypertension, essential Chronic.  Controlled.  Stable.  Blood pressure is 150/70.  Will check lisinopril 10 mg and metoprolol 25 mg. - Comprehensive Metabolic Panel (CMET) - CBC w/Diff/Platelet - lisinopril (ZESTRIL) 10 MG tablet; Take 1 tablet (10 mg total) by mouth daily.  Dispense: 90 tablet; Refill: 0 - metoprolol tartrate (LOPRESSOR) 25 MG tablet; Take 1 tablet (25 mg total) by mouth 2 (two) times daily.  Dispense: 180 tablet; Refill: 0  6. Familial hypercholesterolemia .  Controlled.  Stable.  Continue lovastatin 40 mg once a day at bedtime.  Will check lipid panel for current stable - Lipid Panel With LDL/HDL Ratio - lovastatin (MEVACOR) 40 MG tablet; Take 1 tablet (40 mg total) by mouth at bedtime.  Dispense: 90 tablet; Refill: 0  7. Cigarette nicotine dependence without complication Patient has been advised of the health risks of smoking and counseled concerning  cessation of tobacco products. I spent over 3 minutes for discussion and to answer questions.  8. Type 2 diabetes mellitus without complication, without long-term current use of insulin (HCC) Chronic.  Controlled.  Stable.  Continue Metformin 1 g twice a day.  Will check A1c and microalbuminuria. - HgB A1c - Microalbumin, urine - metFORMIN (GLUCOPHAGE) 1000 MG tablet; Take 1 tablet (1,000 mg total) by mouth 2 (two) times daily with a meal.  Dispense: 180 tablet; Refill: 0  I spent 55 minutes with this patient, More than 50% of that time was spent in face to face education, counseling and care coordination.

## 2020-02-13 LAB — CBC WITH DIFFERENTIAL/PLATELET
Basophils Absolute: 0 10*3/uL (ref 0.0–0.2)
Basos: 0 %
EOS (ABSOLUTE): 0.1 10*3/uL (ref 0.0–0.4)
Eos: 2 %
Hematocrit: 30.2 % — ABNORMAL LOW (ref 34.0–46.6)
Hemoglobin: 8.9 g/dL — ABNORMAL LOW (ref 11.1–15.9)
Immature Grans (Abs): 0 10*3/uL (ref 0.0–0.1)
Immature Granulocytes: 0 %
Lymphocytes Absolute: 2 10*3/uL (ref 0.7–3.1)
Lymphs: 29 %
MCH: 24.1 pg — ABNORMAL LOW (ref 26.6–33.0)
MCHC: 29.5 g/dL — ABNORMAL LOW (ref 31.5–35.7)
MCV: 82 fL (ref 79–97)
Monocytes Absolute: 1 10*3/uL — ABNORMAL HIGH (ref 0.1–0.9)
Monocytes: 14 %
Neutrophils Absolute: 3.9 10*3/uL (ref 1.4–7.0)
Neutrophils: 55 %
Platelets: 429 10*3/uL (ref 150–450)
RBC: 3.7 x10E6/uL — ABNORMAL LOW (ref 3.77–5.28)
RDW: 16.3 % — ABNORMAL HIGH (ref 11.7–15.4)
WBC: 7 10*3/uL (ref 3.4–10.8)

## 2020-02-13 LAB — COMPREHENSIVE METABOLIC PANEL
ALT: 14 IU/L (ref 0–32)
AST: 15 IU/L (ref 0–40)
Albumin/Globulin Ratio: 1.6 (ref 1.2–2.2)
Albumin: 4.3 g/dL (ref 3.6–4.6)
Alkaline Phosphatase: 85 IU/L (ref 44–121)
BUN/Creatinine Ratio: 19 (ref 12–28)
BUN: 19 mg/dL (ref 8–27)
Bilirubin Total: 0.2 mg/dL (ref 0.0–1.2)
CO2: 26 mmol/L (ref 20–29)
Calcium: 9.8 mg/dL (ref 8.7–10.3)
Chloride: 104 mmol/L (ref 96–106)
Creatinine, Ser: 1.01 mg/dL — ABNORMAL HIGH (ref 0.57–1.00)
GFR calc Af Amer: 59 mL/min/{1.73_m2} — ABNORMAL LOW (ref >59)
GFR calc non Af Amer: 52 mL/min/{1.73_m2} — ABNORMAL LOW (ref >59)
Globulin, Total: 2.7 g/dL (ref 1.5–4.5)
Glucose: 99 mg/dL (ref 65–99)
Potassium: 4.8 mmol/L (ref 3.5–5.2)
Sodium: 146 mmol/L — ABNORMAL HIGH (ref 134–144)
Total Protein: 7 g/dL (ref 6.0–8.5)

## 2020-02-13 LAB — LIPID PANEL WITH LDL/HDL RATIO
Cholesterol, Total: 189 mg/dL (ref 100–199)
HDL: 59 mg/dL (ref >39)
LDL Chol Calc (NIH): 103 mg/dL — ABNORMAL HIGH (ref 0–99)
LDL/HDL Ratio: 1.7 ratio (ref 0.0–3.2)
Triglycerides: 158 mg/dL — ABNORMAL HIGH (ref 0–149)
VLDL Cholesterol Cal: 27 mg/dL (ref 5–40)

## 2020-02-13 LAB — HEMOGLOBIN A1C
Est. average glucose Bld gHb Est-mCnc: 120 mg/dL
Hgb A1c MFr Bld: 5.8 % — ABNORMAL HIGH (ref 4.8–5.6)

## 2020-02-13 LAB — MICROALBUMIN, URINE: Microalbumin, Urine: 24 ug/mL

## 2020-02-29 ENCOUNTER — Other Ambulatory Visit (INDEPENDENT_AMBULATORY_CARE_PROVIDER_SITE_OTHER): Payer: Medicare PPO

## 2020-02-29 ENCOUNTER — Other Ambulatory Visit: Payer: Self-pay

## 2020-02-29 DIAGNOSIS — Z1211 Encounter for screening for malignant neoplasm of colon: Secondary | ICD-10-CM | POA: Diagnosis not present

## 2020-02-29 LAB — HEMOCCULT GUIAC POC 1CARD (OFFICE)
Card #2 Fecal Occult Blod, POC: NEGATIVE
Card #3 Fecal Occult Blood, POC: NEGATIVE
Fecal Occult Blood, POC: NEGATIVE

## 2020-04-08 ENCOUNTER — Encounter: Payer: Self-pay | Admitting: Family Medicine

## 2020-04-08 ENCOUNTER — Other Ambulatory Visit: Payer: Self-pay

## 2020-04-08 ENCOUNTER — Ambulatory Visit: Payer: Medicare PPO | Admitting: Family Medicine

## 2020-04-08 VITALS — BP 132/70 | HR 64 | Ht 66.0 in | Wt 169.0 lb

## 2020-04-08 DIAGNOSIS — I10 Essential (primary) hypertension: Secondary | ICD-10-CM | POA: Diagnosis not present

## 2020-04-08 DIAGNOSIS — D649 Anemia, unspecified: Secondary | ICD-10-CM | POA: Diagnosis not present

## 2020-04-08 MED ORDER — METOPROLOL TARTRATE 50 MG PO TABS: 50.0000 mg | ORAL_TABLET | Freq: Two times a day (BID) | ORAL | 1 refills | Status: DC

## 2020-04-08 MED ORDER — LISINOPRIL 10 MG PO TABS: 10.0000 mg | ORAL_TABLET | Freq: Every day | ORAL | 1 refills | Status: DC

## 2020-04-08 NOTE — Progress Notes (Signed)
Date:  04/08/2020   Name:  Beth Franco   DOB:  02-06-1935   MRN:  [id number]   Chief Complaint: Follow-up (microcytic anemia- she started iron supp daily)  Anemia Presents for follow-up visit. There has been no abdominal pain, anorexia, bruising/bleeding easily, confusion, fever, leg swelling, light-headedness, malaise/fatigue, pallor, palpitations, paresthesias or weight loss. Signs of blood loss that are not present include hematemesis, hematochezia, melena, menorrhagia and vaginal bleeding. There is no history of chronic renal disease or heart failure. There are no compliance problems.  Side effects of medications include fatigue.  Hypertension This is a chronic problem. The current episode started more than 1 year ago. The problem has been gradually improving since onset. The problem is controlled. Pertinent negatives include no anxiety, blurred vision, chest pain, headaches, malaise/fatigue, neck pain, orthopnea, palpitations, peripheral edema, PND, shortness of breath or sweats. There are no associated agents to hypertension. There are no known risk factors for coronary artery disease. Past treatments include beta blockers and ACE inhibitors. The current treatment provides moderate improvement. There are no compliance problems.  There is no history of angina, kidney disease, CVA, heart failure, left ventricular hypertrophy, PVD or retinopathy. There is no history of chronic renal disease, a hypertension causing med or renovascular disease.    Lab Results  Component Value Date   CREATININE 1.01 (H) 02/12/2020   BUN 19 02/12/2020   NA 146 (H) 02/12/2020   K 4.8 02/12/2020   CL 104 02/12/2020   CO2 26 02/12/2020   Lab Results  Component Value Date   CHOL 189 02/12/2020   HDL 59 02/12/2020   LDLCALC 103 (H) 02/12/2020   TRIG 158 (H) 02/12/2020   No results found for: TSH Lab Results  Component Value Date   HGBA1C 5.8 (H) 02/12/2020   Lab Results  Component Value Date   WBC  7.0 02/12/2020   HGB 8.9 (L) 02/12/2020   HCT 30.2 (L) 02/12/2020   MCV 82 02/12/2020   PLT 429 02/12/2020   Lab Results  Component Value Date   ALT 14 02/12/2020   AST 15 02/12/2020   ALKPHOS 85 02/12/2020   BILITOT 0.2 02/12/2020     Review of Systems  Constitutional: Negative.  Negative for chills, fatigue, fever, malaise/fatigue, unexpected weight change and weight loss.  HENT: Negative for congestion, ear discharge, ear pain, rhinorrhea, sinus pressure, sneezing and sore throat.   Eyes: Negative for blurred vision, photophobia, pain, discharge, redness and itching.  Respiratory: Negative for cough, shortness of breath, wheezing and stridor.   Cardiovascular: Negative for chest pain, palpitations, orthopnea and PND.  Gastrointestinal: Negative for abdominal pain, anorexia, blood in stool, constipation, diarrhea, hematemesis, hematochezia, melena, nausea and vomiting.  Endocrine: Negative for cold intolerance, heat intolerance, polydipsia, polyphagia and polyuria.  Genitourinary: Negative for dysuria, flank pain, frequency, hematuria, menorrhagia, menstrual problem, pelvic pain, urgency, vaginal bleeding and vaginal discharge.  Musculoskeletal: Negative for arthralgias, back pain, myalgias and neck pain.  Skin: Negative for pallor and rash.  Allergic/Immunologic: Negative for environmental allergies and food allergies.  Neurological: Negative for dizziness, weakness, light-headedness, numbness, headaches and paresthesias.  Hematological: Negative for adenopathy. Does not bruise/bleed easily.  Psychiatric/Behavioral: Negative for confusion and dysphoric mood. The patient is not nervous/anxious.     There are no problems to display for this patient.   No Known Allergies  Past Surgical History:  Procedure Laterality Date  . ABDOMINAL HYSTERECTOMY    . BREAST SURGERY     lumpectomy  .  CARDIAC SURGERY    . EYE SURGERY     cataract    Social History   Tobacco Use  .  Smoking status: Current Every Day Smoker    Types: Cigarettes  . Smokeless tobacco: Never Used  Vaping Use  . Vaping Use: Never used  Substance Use Topics  . Alcohol use: Not Currently  . Drug use: Never     Medication list has been reviewed and updated.  Current Meds  Medication Sig  . albuterol (VENTOLIN HFA) 108 (90 Base) MCG/ACT inhaler Inhale 2 puffs into the lungs every 6 (six) hours as needed for wheezing or shortness of breath.  . Calcium 200 MG TABS Take 1 tablet by mouth in the morning and at bedtime.  . clopidogrel (PLAVIX) 75 MG tablet Take 1 tablet (75 mg total) by mouth daily.  . ferrous sulfate 325 (65 FE) MG EC tablet Take 325 mg by mouth daily with breakfast.  . lisinopril (ZESTRIL) 10 MG tablet Take 1 tablet (10 mg total) by mouth daily.  Marland Kitchen lovastatin (MEVACOR) 40 MG tablet Take 1 tablet (40 mg total) by mouth at bedtime.  . melatonin 1 MG TABS tablet Take 2 mg by mouth at bedtime.  . metFORMIN (GLUCOPHAGE) 1000 MG tablet Take 1 tablet (1,000 mg total) by mouth 2 (two) times daily with a meal.  . metoprolol tartrate (LOPRESSOR) 25 MG tablet Take 1 tablet (25 mg total) by mouth 2 (two) times daily.  . Multiple Vitamins-Minerals (ONE-A-DAY WOMENS 50+ ADVANTAGE PO) Take 1 tablet by mouth daily.  . OXYGEN Inhale into the lungs at bedtime.    PHQ 2/9 Scores 02/12/2020  PHQ - 2 Score 0  PHQ- 9 Score 1    GAD 7 : Generalized Anxiety Score 02/12/2020  Nervous, Anxious, on Edge 0  Control/stop worrying 0  Worry too much - different things 0  Trouble relaxing 0  Restless 0  Easily annoyed or irritable 0  Afraid - awful might happen 0  Total GAD 7 Score 0    BP Readings from Last 3 Encounters:  04/08/20 (!) 142/70  02/12/20 (!) 150/70  01/04/20 (!) 174/59    Physical Exam Vitals and nursing note reviewed.  Constitutional:      Appearance: She is well-developed.  HENT:     Head: Normocephalic.     Right Ear: Tympanic membrane, ear canal and external ear  normal.     Left Ear: Tympanic membrane, ear canal and external ear normal.     Nose: Nose normal. No congestion or rhinorrhea.     Mouth/Throat:     Mouth: Mucous membranes are moist.  Eyes:     General: Lids are everted, no foreign bodies appreciated. No scleral icterus.       Left eye: No foreign body or hordeolum.     Conjunctiva/sclera: Conjunctivae normal.     Right eye: Right conjunctiva is not injected.     Left eye: Left conjunctiva is not injected.     Pupils: Pupils are equal, round, and reactive to light.  Neck:     Thyroid: No thyromegaly.     Vascular: No carotid bruit or JVD.     Trachea: No tracheal deviation.  Cardiovascular:     Rate and Rhythm: Normal rate and regular rhythm.     Pulses: Normal pulses.     Heart sounds: Normal heart sounds. No murmur heard.  No friction rub. No gallop.   Pulmonary:     Effort: Pulmonary effort is  normal. No respiratory distress.     Breath sounds: Normal breath sounds. No wheezing, rhonchi or rales.  Abdominal:     General: Bowel sounds are normal. There is no distension.     Palpations: Abdomen is soft. There is no mass.     Tenderness: There is no abdominal tenderness. There is no right CVA tenderness, left CVA tenderness, guarding or rebound.     Hernia: No hernia is present.  Musculoskeletal:        General: No tenderness. Normal range of motion.     Cervical back: Normal range of motion and neck supple.  Lymphadenopathy:     Cervical: No cervical adenopathy.  Skin:    General: Skin is warm.     Capillary Refill: Capillary refill takes less than 2 seconds.     Findings: No bruising or rash.  Neurological:     Mental Status: She is alert and oriented to person, place, and time.     Cranial Nerves: No cranial nerve deficit.     Deep Tendon Reflexes: Reflexes normal.  Psychiatric:        Mood and Affect: Mood normal. Mood is not anxious or depressed.        Behavior: Behavior normal.     Wt Readings from Last 3  Encounters:  04/08/20 169 lb (76.7 kg)  02/12/20 166 lb (75.3 kg)  01/04/20 150 lb (68 kg)    BP (!) 142/70   Pulse 64   Ht 5\' 6"  (1.676 m)   Wt 169 lb (76.7 kg)   BMI 27.28 kg/m   Assessment and Plan: 1. Hypertension, essential Chronic.  Controlled.  Stable.  Blood pressure today is 132/70.  We will continue lisinopril 10 mg and will increase metoprolol to 50 mg 1 twice a day.  Patient does have a history of CAD and cerebrovascular disease for which we will try to decrease gently the systolic a little more. - lisinopril (ZESTRIL) 10 MG tablet; Take 1 tablet (10 mg total) by mouth daily.  Dispense: 90 tablet; Refill: 1 - metoprolol tartrate (LOPRESSOR) 50 MG tablet; Take 1 tablet (50 mg total) by mouth 2 (two) times daily.  Dispense: 180 tablet; Refill: 1  2. Anemia, unspecified type Chronic.  Controlled.  Stable.  We will recheck CBC to see what current status of hemoglobin/hematocrit having started iron supplementation at 325 mg daily.  We will check a ferritin to see if adequate iron supplementation is being maintained. - ferrous sulfate 325 (65 FE) MG EC tablet; Take 325 mg by mouth daily with breakfast. - CBC with Differential/Platelet - Ferritin

## 2020-04-09 LAB — CBC WITH DIFFERENTIAL/PLATELET
Basophils Absolute: 0 10*3/uL (ref 0.0–0.2)
Basos: 0 %
EOS (ABSOLUTE): 0.1 10*3/uL (ref 0.0–0.4)
Eos: 2 %
Hematocrit: 36.4 % (ref 34.0–46.6)
Hemoglobin: 11.3 g/dL (ref 11.1–15.9)
Immature Grans (Abs): 0 10*3/uL (ref 0.0–0.1)
Immature Granulocytes: 0 %
Lymphocytes Absolute: 2 10*3/uL (ref 0.7–3.1)
Lymphs: 30 %
MCH: 26.7 pg (ref 26.6–33.0)
MCHC: 31 g/dL — ABNORMAL LOW (ref 31.5–35.7)
MCV: 86 fL (ref 79–97)
Monocytes Absolute: 0.8 10*3/uL (ref 0.1–0.9)
Monocytes: 11 %
Neutrophils Absolute: 3.8 10*3/uL (ref 1.4–7.0)
Neutrophils: 57 %
Platelets: 341 10*3/uL (ref 150–450)
RBC: 4.24 x10E6/uL (ref 3.77–5.28)
RDW: 17.6 % — ABNORMAL HIGH (ref 11.7–15.4)
WBC: 6.7 10*3/uL (ref 3.4–10.8)

## 2020-04-09 LAB — FERRITIN: Ferritin: 14 ng/mL — ABNORMAL LOW (ref 15–150)

## 2020-04-16 ENCOUNTER — Other Ambulatory Visit: Payer: Self-pay

## 2020-04-16 ENCOUNTER — Encounter: Payer: Self-pay | Admitting: Emergency Medicine

## 2020-04-16 ENCOUNTER — Ambulatory Visit: Payer: Medicare PPO

## 2020-04-16 ENCOUNTER — Ambulatory Visit: Payer: Self-pay | Admitting: *Deleted

## 2020-04-16 ENCOUNTER — Ambulatory Visit
Admission: EM | Admit: 2020-04-16 | Discharge: 2020-04-16 | Disposition: A | Discharge status: Home / Self Care | Payer: Medicare PPO | Attending: Physician Assistant | Admitting: Physician Assistant

## 2020-04-16 DIAGNOSIS — R6889 Other general symptoms and signs: Secondary | ICD-10-CM

## 2020-04-16 DIAGNOSIS — I10 Essential (primary) hypertension: Secondary | ICD-10-CM | POA: Insufficient documentation

## 2020-04-16 LAB — URINALYSIS, COMPLETE (UACMP) WITH MICROSCOPIC
Bacteria, UA: NONE SEEN
Bilirubin Urine: NEGATIVE
Glucose, UA: NEGATIVE mg/dL
Hgb urine dipstick: NEGATIVE
Ketones, ur: NEGATIVE mg/dL
Nitrite: NEGATIVE
Protein, ur: NEGATIVE mg/dL
RBC / HPF: NONE SEEN RBC/hpf (ref 0–5)
Specific Gravity, Urine: 1.015 (ref 1.005–1.030)
pH: 6.5 (ref 5.0–8.0)

## 2020-04-16 MED ORDER — LISINOPRIL 10 MG PO TABS: 20.0000 mg | ORAL_TABLET | Freq: Every day | ORAL | 0 refills | Status: DC

## 2020-04-16 NOTE — Discharge Instructions (Addendum)
Continue metoprolol tartrate 50 mg tablets twice daily Continue lisinopril 10 mg tablets once daily  Keep a log of your blood pressure over the next 4 to 5 days and if consistently elevated greater than 130/80, increase the lisinopril to 20 mg daily.  I have printed a prescription in case you need to increase the dose.  Keep follow-up appointment with PCP.  If you continue to have issues with your blood pressure, see if they can move your appointment up.  Go to emergency department if you have any elevated blood pressures with associated symptoms of severe headaches, mental status changes, visual changes, facial drooping, increased numbness/weakness/tingling of extremity, chest pain, palpitations, increased breathing difficulty, weakness, etc.

## 2020-04-16 NOTE — Telephone Encounter (Signed)
Patient's daughter is calling to report that patient had increase in dosing of BP medication at her last visit-140/90- at office. They have been monitoring BP and it has increased over the last few days: 194/90 and 200/90. Call to office as advised- triaged for elevated BP and call made to office for patient/daughter. No appointment available today- advised UC. Daughter dos not want to cancel wellness visit- in case mother refuses to go to UC.  Home- 207/92 today  Reason for Disposition  [9] Systolic BP  >= 977 OR Diastolic >= 414  AND [2] having NO cardiac or neurologic symptoms  Answer Assessment - Initial Assessment Questions 1. BLOOD PRESSURE: "What is the blood pressure?" "Did you take at least two measurements 5 minutes apart?"     207/92, 214/92 P 64 2. ONSET: "When did you take your blood pressure?"     2:00 3. HOW: "How did you obtain the blood pressure?" (e.g., visiting nurse, automatic home BP monitor)     Automatic cuff- arm 4. HISTORY: "Do you have a history of high blood pressure?"     yes 5. MEDICATIONS: "Are you taking any medications for blood pressure?" "Have you missed any doses recently?"     Recent increase in BP-appointment today 6. OTHER SYMPTOMS: "Do you have any symptoms?" (e.g., headache, chest pain, blurred vision, difficulty breathing, weakness)     No other symptoms 7. PREGNANCY: "Is there any chance you are pregnant?" "When was your last menstrual period?"     n/a  Protocols used: BLOOD PRESSURE - HIGH-A-AH

## 2020-04-16 NOTE — ED Triage Notes (Signed)
Beth Franco states Dr. Ronnald Ramp changed her BP medication and she started taking the new dose of Metoprolol on Saturday. She is now taking Metoprolol 50mg  twice daily. Her BP was 200/90 Monday, 195/94 Tuesday, 206/90 and 215/90 (today). Beth Franco denies headache and states she feels fine.

## 2020-04-16 NOTE — ED Provider Notes (Signed)
[facility]    CSN: [id number] Arrival date & time: 04/16/20  1526      History   Chief Complaint Chief Complaint  Patient presents with  . Hypertension    HPI Beth Franco is a 85 y.o. female presenting for concerns about elevated blood pressure.  Patient states that she has been taking her medications as prescribed.  She takes lisinopril 10 mg daily.  She says that her PCP recently increased her dose of metoprolol tartrate from 25 mg twice daily to 50 mg twice daily about a week ago.  She started taking the new dose about 4 days ago.  Patient states that since her medication change, she has had elevated blood pressures.  Patient admits to elevated blood pressures in the 190s to 200s over upper 90s.  Her daughter is with her today and states that she has been the one taking her blood pressures at home with the cuff.  Patient's daughter is test of the cuff against her own arm and states that the results have been accurate.  Patient denies any associated symptoms.  She denies any severe headaches, weakness, visual changes, numbness/tingling/weakness of extremity, chest pain, palpitations, breathing difficulty, dizziness, or falls.  Past medical history is significant for COPD, CAD, diabetes, hyper lipidemia, and TIA.  They have no other concerns today.  HPI  Past Medical History:  Diagnosis Date  . Cancer ([place])   . COPD (chronic obstructive pulmonary disease) ([place])   . Coronary artery disease   . Diabetes mellitus without complication ([place])   . Hypercholesterolemia   . Hypertension     There are no problems to display for this patient.   Past Surgical History:  Procedure Laterality Date  . ABDOMINAL HYSTERECTOMY    . BREAST SURGERY     lumpectomy  . CARDIAC SURGERY    . EYE SURGERY     cataract    OB History   No obstetric history on file.      Home Medications    Prior to Admission medications   Medication Sig Start Date End Date Taking? Authorizing  Provider  albuterol (VENTOLIN HFA) 108 (90 Base) MCG/ACT inhaler Inhale 2 puffs into the lungs every 6 (six) hours as needed for wheezing or shortness of breath. 02/12/20   Juline Patch, MD  Calcium 200 MG TABS Take 1 tablet by mouth in the morning and at bedtime.    [provider]  clopidogrel (PLAVIX) 75 MG tablet Take 1 tablet (75 mg total) by mouth daily. 02/12/20   Juline Patch, MD  ferrous sulfate 325 (65 FE) MG EC tablet Take 325 mg by mouth daily with breakfast.    [provider]  lisinopril (ZESTRIL) 10 MG tablet Take 1 tablet (10 mg total) by mouth daily. 04/08/20 05/08/20  Juline Patch, MD  lisinopril (ZESTRIL) 10 MG tablet Take 2 tablets (20 mg total) by mouth daily. 04/16/20 05/16/20  Danton Clap, PA-C  lovastatin (MEVACOR) 40 MG tablet Take 1 tablet (40 mg total) by mouth at bedtime. 02/12/20 04/08/20  Juline Patch, MD  melatonin 1 MG TABS tablet Take 2 mg by mouth at bedtime.    [provider]  metFORMIN (GLUCOPHAGE) 1000 MG tablet Take 1 tablet (1,000 mg total) by mouth 2 (two) times daily with a meal. 02/12/20 04/08/20  Juline Patch, MD  metoprolol tartrate (LOPRESSOR) 50 MG tablet Take 1 tablet (50 mg total) by mouth 2 (two) times daily. 04/08/20  Juline Patch, MD  Multiple Vitamins-Minerals (ONE-A-DAY WOMENS 50+ ADVANTAGE PO) Take 1 tablet by mouth daily.    [provider]  OXYGEN Inhale into the lungs at bedtime.    [provider]    Family History Family History  Problem Relation Age of Onset  . Cancer Father     Social History Social History   Tobacco Use  . Smoking status: Current Every Day Smoker    Types: Cigarettes  . Smokeless tobacco: Never Used  Vaping Use  . Vaping Use: Never used  Substance Use Topics  . Alcohol use: Not Currently  . Drug use: Never     Allergies   Patient has no known allergies.   Review of Systems Review of Systems  Constitutional: Negative for fatigue and  fever.  HENT: Negative for congestion.   Eyes: Negative for photophobia and visual disturbance.  Respiratory: Negative for cough, chest tightness, shortness of breath and wheezing.   Cardiovascular: Negative for chest pain, palpitations and leg swelling.  Neurological: Negative for dizziness, weakness, light-headedness and headaches.  Psychiatric/Behavioral: Positive for confusion. Negative for behavioral problems and dysphoric mood.     Physical Exam Triage Vital Signs ED Triage Vitals  Enc Vitals Group     BP 04/16/20 1545 (!) 170/67     Pulse Rate 04/16/20 1545 (!) 54     Resp 04/16/20 1545 (!) 22     Temp 04/16/20 1545 99.5 F (37.5 C)     Temp Source 04/16/20 1545 Oral     SpO2 04/16/20 1545 93 %     Weight 04/16/20 1544 160 lb (72.6 kg)     Height 04/16/20 1544 5\' 5"  (1.651 m)     Head Circumference --      Peak Flow --      Pain Score 04/16/20 1544 0     Pain Loc --      Pain Edu? --      Excl. in [place]? --    No data found.  Updated Vital Signs BP (!) 160/54 (BP Location: Right Arm)   Pulse (!) 55   Temp 99.5 F (37.5 C) (Oral)   Resp (!) 22   Ht 5\' 5"  (1.651 m)   Wt 160 lb (72.6 kg)   SpO2 94%   BMI 26.63 kg/m       Physical Exam Vitals and nursing note reviewed.  Constitutional:      General: She is not in acute distress.    Appearance: Normal appearance. She is not ill-appearing or toxic-appearing.  HENT:     Head: Normocephalic and atraumatic.     Nose: Nose normal.     Mouth/Throat:     Mouth: Mucous membranes are moist.     Pharynx: Oropharynx is clear.  Eyes:     General: No scleral icterus.       Right eye: No discharge.        Left eye: No discharge.     Conjunctiva/sclera: Conjunctivae normal.  Cardiovascular:     Rate and Rhythm: Normal rate and regular rhythm.     Heart sounds: Normal heart sounds.  Pulmonary:     Effort: Pulmonary effort is normal. No respiratory distress.     Breath sounds: Wheezing (diffuse wheezing throughout)  present.  Musculoskeletal:     Cervical back: Neck supple.  Skin:    General: Skin is dry.  Neurological:     General: No focal deficit present.     Mental Status: She  is alert. Mental status is at baseline.     Motor: No weakness.     Gait: Gait normal.  Psychiatric:        Mood and Affect: Mood normal.        Behavior: Behavior normal.        Thought Content: Thought content normal.      UC Treatments / Results  Labs (all labs ordered are listed, but only abnormal results are displayed) Labs Reviewed  URINALYSIS, COMPLETE (UACMP) WITH MICROSCOPIC    EKG   Radiology No results found.  Procedures Procedures (including critical care time)  Medications Ordered in UC Medications - No data to display  Initial Impression / Assessment and Plan / UC Course  I have reviewed the triage vital signs and the nursing notes.  Pertinent labs & imaging results that were available during my care of the patient were reviewed by me and considered in my medical decision making (see chart for details).   85 year old female presenting for elevated blood pressures with at home monitor.  Her daughter is with her today and states that she is checked her own pressure with this monitor and it is accurate.  Blood pressures are in the 190s to 200s over 90s at home.  Blood pressure in the clinic today is initially 170/67 and on recheck is 160/54.  Patient saw her PCP 1 week ago and had her metoprolol tartrate dose increased from 25 mg twice a day to 50 mg twice a day.  Advised to continue on lisinopril 10 mg.  Patient states that she has been taking these medications as prescribed.  She does admit that she has not checked her blood pressure before the medication dose change.  She denies any symptoms.  Exam is at baseline.  Her daughter pulled me aside to voice concerns about recent "forgetfulness" over the past couple of weeks.  Daughters concerned about possible dementia.  She states that she seems  mentally there for the most part, but forgets things every now and then.  She has not noticed any other symptoms.  Advised patient's daughter we could check a urinalysis to see if she has any acute UTI.  Advised to follow-up with PCP regarding concerns about dementia.   Urinalysis shows small leukocytes so I will culture the urine.  Advised patient to continue on metoprolol tartrate 50 mg twice daily.  Advised her to continue lisinopril 10 mg daily.  Advised to keep blood pressure record over the next 4 to 5 days and if consistently over 130/80, advised her to increase the lisinopril to 20 mg daily and then notify her PCP.  Advised to keep follow-up appointment PCP.  ED precautions reviewed with patient and daughter.  Also, patient has a lot of wheezing on her exam.  She states that she has not used her inhalers in the past couple of days.  Advised her to use her inhalers as prescribed.  Follow-up here or in the ED if there is any increased breathing difficulty.  Final Clinical Impressions(s) / UC Diagnoses   Final diagnoses:  Essential hypertension  Forgetfulness     Discharge Instructions     Continue metoprolol tartrate 50 mg tablets twice daily Continue lisinopril 10 mg tablets once daily  Keep a log of your blood pressure over the next 4 to 5 days and if consistently elevated greater than 130/80, increase the lisinopril to 20 mg daily.  I have printed a prescription in case you need to increase the dose.  Keep follow-up appointment with PCP.  If you continue to have issues with your blood pressure, see if they can move your appointment up.  Go to emergency department if you have any elevated blood pressures with associated symptoms of severe headaches, mental status changes, visual changes, facial drooping, increased numbness/weakness/tingling of extremity, chest pain, palpitations, increased breathing difficulty, weakness, etc.    ED Prescriptions    Medication Sig Dispense Auth.  Provider   lisinopril (ZESTRIL) 10 MG tablet Take 2 tablets (20 mg total) by mouth daily. 60 tablet Gretta Cool     PDMP not reviewed this encounter.   Danton Clap, PA-C 04/16/20 1710

## 2020-04-17 LAB — URINE CULTURE: Culture: NO GROWTH

## 2020-05-12 ENCOUNTER — Other Ambulatory Visit: Payer: Self-pay | Admitting: Family Medicine

## 2020-05-12 DIAGNOSIS — E119 Type 2 diabetes mellitus without complications: Secondary | ICD-10-CM

## 2020-05-12 DIAGNOSIS — E7801 Familial hypercholesterolemia: Secondary | ICD-10-CM

## 2020-05-12 DIAGNOSIS — I679 Cerebrovascular disease, unspecified: Secondary | ICD-10-CM

## 2020-05-12 DIAGNOSIS — I10 Essential (primary) hypertension: Secondary | ICD-10-CM

## 2020-05-12 DIAGNOSIS — I2510 Atherosclerotic heart disease of native coronary artery without angina pectoris: Secondary | ICD-10-CM

## 2020-05-28 ENCOUNTER — Other Ambulatory Visit: Payer: Self-pay | Admitting: Family Medicine

## 2020-05-28 DIAGNOSIS — I10 Essential (primary) hypertension: Secondary | ICD-10-CM

## 2020-05-28 DIAGNOSIS — I2510 Atherosclerotic heart disease of native coronary artery without angina pectoris: Secondary | ICD-10-CM

## 2020-05-29 ENCOUNTER — Telehealth: Payer: Self-pay | Admitting: Family Medicine

## 2020-05-29 NOTE — Telephone Encounter (Signed)
NO ANSWER/NO VM. Pt due to schedule Medicare Annual Wellness Visit (AWV) either virtually or in office. Whichever the patients preference is.  No history of AWV; please schedule at anytime with [facility] Health Advisor.  This should be a 40 minute visit  AWV-I PER [place] AS OF 05/03/2009

## 2020-06-10 ENCOUNTER — Encounter: Payer: Self-pay | Admitting: Family Medicine

## 2020-06-10 ENCOUNTER — Other Ambulatory Visit: Payer: Self-pay | Admitting: Family Medicine

## 2020-06-10 ENCOUNTER — Other Ambulatory Visit: Payer: Self-pay

## 2020-06-10 ENCOUNTER — Ambulatory Visit: Payer: Medicare PPO | Admitting: Family Medicine

## 2020-06-10 VITALS — BP 160/70 | HR 76 | Ht 65.0 in | Wt 169.0 lb

## 2020-06-10 DIAGNOSIS — R413 Other amnesia: Secondary | ICD-10-CM | POA: Diagnosis not present

## 2020-06-10 DIAGNOSIS — I10 Essential (primary) hypertension: Secondary | ICD-10-CM | POA: Diagnosis not present

## 2020-06-10 MED ORDER — LOSARTAN POTASSIUM 50 MG PO TABS: 50.0000 mg | ORAL_TABLET | Freq: Every day | ORAL | 0 refills | Status: DC

## 2020-06-10 NOTE — Progress Notes (Signed)
Date:  06/10/2020   Name:  Beth Franco   DOB:  [birth year]   MRN:  [id number]   Chief Complaint: Hypertension  Hypertension This is a chronic problem. The current episode started more than 1 year ago. The problem has been waxing and waning since onset. The problem is controlled. Pertinent negatives include no anxiety, blurred vision, chest pain, headaches, malaise/fatigue, neck pain, orthopnea, palpitations, peripheral edema, PND, shortness of breath or sweats. There are no associated agents to hypertension. Past treatments include ACE inhibitors and beta blockers. The current treatment provides moderate improvement. There are no compliance problems.  There is no history of angina, kidney disease, CAD/MI, CVA, heart failure, left ventricular hypertrophy, PVD or retinopathy. There is no history of chronic renal disease, a hypertension causing med or renovascular disease.    Lab Results  Component Value Date   CREATININE 1.01 (H) 02/12/2020   BUN 19 02/12/2020   NA 146 (H) 02/12/2020   K 4.8 02/12/2020   CL 104 02/12/2020   CO2 26 02/12/2020   Lab Results  Component Value Date   CHOL 189 02/12/2020   HDL 59 02/12/2020   LDLCALC 103 (H) 02/12/2020   TRIG 158 (H) 02/12/2020   No results found for: TSH Lab Results  Component Value Date   HGBA1C 5.8 (H) 02/12/2020   Lab Results  Component Value Date   WBC 6.7 04/08/2020   HGB 11.3 04/08/2020   HCT 36.4 04/08/2020   MCV 86 04/08/2020   PLT 341 04/08/2020   Lab Results  Component Value Date   ALT 14 02/12/2020   AST 15 02/12/2020   ALKPHOS 85 02/12/2020   BILITOT 0.2 02/12/2020     Review of Systems  Constitutional: Negative.  Negative for chills, fatigue, fever, malaise/fatigue and unexpected weight change.  HENT: Negative for congestion, ear discharge, ear pain, rhinorrhea, sinus pressure, sneezing and sore throat.   Eyes: Negative for blurred vision, double vision, photophobia, pain, discharge, redness and itching.   Respiratory: Negative for cough, shortness of breath, wheezing and stridor.   Cardiovascular: Negative for chest pain, palpitations, orthopnea and PND.  Gastrointestinal: Negative for abdominal pain, blood in stool, constipation, diarrhea, nausea and vomiting.  Endocrine: Negative for cold intolerance, heat intolerance, polydipsia, polyphagia and polyuria.  Genitourinary: Negative for dysuria, flank pain, frequency, hematuria, menstrual problem, pelvic pain, urgency, vaginal bleeding and vaginal discharge.  Musculoskeletal: Negative for arthralgias, back pain, myalgias and neck pain.  Skin: Negative for rash.  Allergic/Immunologic: Negative for environmental allergies and food allergies.  Neurological: Negative for dizziness, weakness, light-headedness, numbness and headaches.  Hematological: Negative for adenopathy. Does not bruise/bleed easily.  Psychiatric/Behavioral: Negative for dysphoric mood. The patient is not nervous/anxious.     There are no problems to display for this patient.   No Known Allergies  Past Surgical History:  Procedure Laterality Date  . ABDOMINAL HYSTERECTOMY    . BREAST SURGERY     lumpectomy  . CARDIAC SURGERY    . EYE SURGERY     cataract    Social History   Tobacco Use  . Smoking status: Current Every Day Smoker    Types: Cigarettes  . Smokeless tobacco: Never Used  Vaping Use  . Vaping Use: Never used  Substance Use Topics  . Alcohol use: Not Currently  . Drug use: Never     Medication list has been reviewed and updated.  Current Meds  Medication Sig  . albuterol (VENTOLIN HFA) 108 (90 Base) MCG/ACT inhaler Inhale  2 puffs into the lungs every 6 (six) hours as needed for wheezing or shortness of breath.  . Calcium 200 MG TABS Take 1 tablet by mouth in the morning and at bedtime.  . clopidogrel (PLAVIX) 75 MG tablet Take 1 tablet by mouth once daily  . ferrous sulfate 325 (65 FE) MG EC tablet Take 325 mg by mouth daily with breakfast.   . lisinopril (ZESTRIL) 10 MG tablet Take 1 tablet (10 mg total) by mouth daily.  Marland Kitchen lovastatin (MEVACOR) 40 MG tablet TAKE 1 TABLET BY MOUTH AT BEDTIME  . melatonin 1 MG TABS tablet Take 2 mg by mouth at bedtime.  . metFORMIN (GLUCOPHAGE) 1000 MG tablet TAKE 1 TABLET BY MOUTH TWICE DAILY BEFORE MEAL(S)  . metoprolol tartrate (LOPRESSOR) 50 MG tablet Take 1 tablet (50 mg total) by mouth 2 (two) times daily.  . Multiple Vitamins-Minerals (ONE-A-DAY WOMENS 50+ ADVANTAGE PO) Take 1 tablet by mouth daily.  . OXYGEN Inhale into the lungs at bedtime.    PHQ 2/9 Scores 06/10/2020 02/12/2020  PHQ - 2 Score 0 0  PHQ- 9 Score 2 1    GAD 7 : Generalized Anxiety Score 06/10/2020 02/12/2020  Nervous, Anxious, on Edge 0 0  Control/stop worrying 0 0  Worry too much - different things 0 0  Trouble relaxing 0 0  Restless 0 0  Easily annoyed or irritable 0 0  Afraid - awful might happen 0 0  Total GAD 7 Score 0 0    BP Readings from Last 3 Encounters:  06/10/20 (!) 160/70  04/16/20 (!) 160/54  04/08/20 132/70    Physical Exam Vitals and nursing note reviewed.  Constitutional:      Appearance: She is well-developed and well-nourished.  HENT:     Head: Normocephalic.     Right Ear: Tympanic membrane, ear canal and external ear normal. There is no impacted cerumen.     Left Ear: Tympanic membrane, ear canal and external ear normal. There is no impacted cerumen.     Nose: Nose normal.     Mouth/Throat:     Mouth: Oropharynx is clear and moist. Mucous membranes are moist.     Pharynx: No oropharyngeal exudate or posterior oropharyngeal erythema.  Eyes:     General: Lids are everted, no foreign bodies appreciated. No scleral icterus.       Left eye: No foreign body or hordeolum.     Extraocular Movements: EOM normal.     Conjunctiva/sclera: Conjunctivae normal.     Right eye: Right conjunctiva is not injected.     Left eye: Left conjunctiva is not injected.     Pupils: Pupils are equal, round,  and reactive to light.  Neck:     Thyroid: No thyromegaly.     Vascular: No JVD.     Trachea: No tracheal deviation.  Cardiovascular:     Rate and Rhythm: Normal rate and regular rhythm.     Pulses: Intact distal pulses.     Heart sounds: Normal heart sounds. No murmur heard. No friction rub. No gallop.   Pulmonary:     Effort: Pulmonary effort is normal. No respiratory distress.     Breath sounds: Normal breath sounds. No stridor. No wheezing, rhonchi or rales.  Chest:     Chest wall: No tenderness.  Abdominal:     General: Bowel sounds are normal.     Palpations: Abdomen is soft. There is no hepatosplenomegaly or mass.     Tenderness: There is no  abdominal tenderness. There is no guarding or rebound.  Musculoskeletal:        General: No tenderness or edema. Normal range of motion.     Cervical back: Normal range of motion and neck supple.  Lymphadenopathy:     Cervical: No cervical adenopathy.  Skin:    General: Skin is warm.     Findings: No rash.  Neurological:     Mental Status: She is alert and oriented to person, place, and time.     Cranial Nerves: No cranial nerve deficit.     Sensory: No sensory deficit.     Motor: No weakness.     Coordination: Coordination normal.     Gait: Gait normal.     Deep Tendon Reflexes: Strength normal. Reflexes normal.  Psychiatric:        Mood and Affect: Mood and affect normal. Mood is not anxious or depressed.     Wt Readings from Last 3 Encounters:  06/10/20 169 lb (76.7 kg)  04/16/20 160 lb (72.6 kg)  04/08/20 169 lb (76.7 kg)    BP (!) 160/70   Pulse 76   Ht 5\' 5"  (1.651 m)   Wt 169 lb (76.7 kg)   BMI 28.12 kg/m   Assessment and Plan: 1. Hypertension, essential Chronic.  Relatively controlled.  But elevated systolic.  Stable.  Patient had elevated readings necessitating visit to the urgent care at which time they suggested increasing the lisinopril to 20 which the patient did not do.  Today she is returned and her  blood pressure is 160/70.  We will continue metoprolol at current dosing but we will switch her lisinopril to losartan 50 mg once a day with anticipation that we may need to go to 100 and this would be an easier medication to increase dosing.  Patient will return in 4 weeks and we will recheck blood pressure. - losartan (COZAAR) 50 MG tablet; Take 1 tablet (50 mg total) by mouth daily.  Dispense: 90 tablet; Refill: 0  2. Memory changes The daughter is noticed some recent memory changes but patient seems to have a fairly good memory but have some moments of lapses at home.  The family with and patient would like to approach this early and would like to have official evaluation for cognitive concerns and we will make a referral to [facility] for evaluation. - Ambulatory referral to Neurology

## 2020-06-10 NOTE — Patient Instructions (Addendum)
How to Take Your Blood Pressure Blood pressure measures how strongly your blood is pressing against the walls of your arteries. Arteries are blood vessels that carry blood from your heart throughout your body. You can take your blood pressure at home with a machine. You may need to check your blood pressure at home:  To check if you have high blood pressure (hypertension).  To check your blood pressure over time.  To make sure your blood pressure medicine is working. Supplies needed:  Blood pressure machine, or monitor.  Dining room chair to sit in.  Table or desk.  Small notebook.  Pencil or pen. How to prepare Avoid these things for 30 minutes before checking your blood pressure:  Having drinks with caffeine in them, such as coffee or tea.  Drinking alcohol.  Eating.  Smoking.  Exercising. Do these things five minutes before checking your blood pressure:  Go to the bathroom and pee (urinate).  Sit in a dining chair. Do not sit in a soft couch or an armchair.  Be quiet. Do not talk. How to take your blood pressure Follow the instructions that came with your machine. If you have a digital blood pressure monitor, these may be the instructions: 1. Sit up straight. 2. Place your feet on the floor. Do not cross your ankles or legs. 3. Rest your left arm at the level of your heart. You may rest it on a table, desk, or chair. 4. Pull up your shirt sleeve. 5. Wrap the blood pressure cuff around the upper part of your left arm. The cuff should be 1 inch (2.5 cm) above your elbow. It is best to wrap the cuff around bare skin. 6. Fit the cuff snugly around your arm. You should be able to place only one finger between the cuff and your arm. 7. Place the cord so that it rests in the bend of your elbow. 8. Press the power button. 9. Sit quietly while the cuff fills with air and loses air. 10. Write down the numbers on the screen. 11. Wait 2-3 minutes and then repeat steps 1-10.    What do the numbers mean? Two numbers make up your blood pressure. The first number is called systolic pressure. The second is called diastolic pressure. An example of a blood pressure reading is "120 over 80" (or 120/80). If you are an adult and do not have a medical condition, use this guide to find out if your blood pressure is normal: Normal  First number: below 120.  Second number: below 80. Elevated  First number: 120-129.  Second number: below 80. Hypertension stage 1  First number: 130-139.  Second number: 80-89. Hypertension stage 2  First number: 140 or above.  Second number: 90 or above. Your blood pressure is above normal even if only the top or bottom number is above normal. Follow these instructions at home:  Check your blood pressure as often as your doctor tells you to.  Check your blood pressure at the same time every day.  Take your monitor to your next doctor's appointment. Your doctor will: ? Make sure you are using it correctly. ? Make sure it is working right.  Make sure you understand what your blood pressure numbers should be.  Tell your doctor if your medicine is causing side effects.  Keep all follow-up visits as told by your doctor. This is important. General tips:  You will need a blood pressure machine, or monitor. Your doctor can suggest a   monitor. You can buy one at a drugstore or online. When choosing one: ? Choose one with an arm cuff. ? Choose one that wraps around your upper arm. Only one finger should fit between your arm and the cuff. ? Do not choose one that measures your blood pressure from your wrist or finger. Where to find more information American Heart Association: www.heart.org Contact a doctor if:  Your blood pressure keeps being high. Get help right away if:  Your first blood pressure number is higher than 180.  Your second blood pressure number is higher than 120. Summary  Check your blood pressure at the same  time every day.  Avoid caffeine, alcohol, smoking, and exercise for 30 minutes before checking your blood pressure.  Make sure you understand what your blood pressure numbers should be. This information is not intended to replace advice given to you by your health care provider. Make sure you discuss any questions you have with your health care provider. Document Revised: 04/13/2019 Document Reviewed: 04/13/2019 Elsevier Patient Education  2021 [place].  Insomnia Insomnia is a sleep disorder that makes it difficult to fall asleep or stay asleep. Insomnia can cause fatigue, low energy, difficulty concentrating, mood swings, and poor performance at work or school. There are three different ways to classify insomnia:  Difficulty falling asleep.  Difficulty staying asleep.  Waking up too early in the morning. Any type of insomnia can be long-term (chronic) or short-term (acute). Both are common. Short-term insomnia usually lasts for three months or less. Chronic insomnia occurs at least three times a week for longer than three months. What are the causes? Insomnia may be caused by another condition, situation, or substance, such as:  Anxiety.  Certain medicines.  Gastroesophageal reflux disease (GERD) or other gastrointestinal conditions.  Asthma or other breathing conditions.  Restless legs syndrome, sleep apnea, or other sleep disorders.  Chronic pain.  Menopause.  Stroke.  Abuse of alcohol, tobacco, or illegal drugs.  Mental health conditions, such as depression.  Caffeine.  Neurological disorders, such as Alzheimer's disease.  An overactive thyroid (hyperthyroidism). Sometimes, the cause of insomnia may not be known. What increases the risk? Risk factors for insomnia include:  Gender. Women are affected more often than men.  Age. Insomnia is more common as you get older.  Stress.  Lack of exercise.  Irregular work schedule or working night  shifts.  Traveling between different time zones.  Certain medical and mental health conditions. What are the signs or symptoms? If you have insomnia, the main symptom is having trouble falling asleep or having trouble staying asleep. This may lead to other symptoms, such as:  Feeling fatigued or having low energy.  Feeling nervous about going to sleep.  Not feeling rested in the morning.  Having trouble concentrating.  Feeling irritable, anxious, or depressed. How is this diagnosed? This condition may be diagnosed based on:  Your symptoms and medical history. Your health care provider may ask about: ? Your sleep habits. ? Any medical conditions you have. ? Your mental health.  A physical exam. How is this treated? Treatment for insomnia depends on the cause. Treatment may focus on treating an underlying condition that is causing insomnia. Treatment may also include:  Medicines to help you sleep.  Counseling or therapy.  Lifestyle adjustments to help you sleep better. Follow these instructions at home: Eating and drinking  Limit or avoid alcohol, caffeinated beverages, and cigarettes, especially close to bedtime. These can disrupt your sleep.  Do not eat a large meal or eat spicy foods right before bedtime. This can lead to digestive discomfort that can make it hard for you to sleep.   Sleep habits  Keep a sleep diary to help you and your health care provider figure out what could be causing your insomnia. Write down: ? When you sleep. ? When you wake up during the night. ? How well you sleep. ? How rested you feel the next day. ? Any side effects of medicines you are taking. ? What you eat and drink.  Make your bedroom a dark, comfortable place where it is easy to fall asleep. ? Put up shades or blackout curtains to block light from outside. ? Use a white noise machine to block noise. ? Keep the temperature cool.  Limit screen use before bedtime. This  includes: ? Watching TV. ? Using your smartphone, tablet, or computer.  Stick to a routine that includes going to bed and waking up at the same times every day and night. This can help you fall asleep faster. Consider making a quiet activity, such as reading, part of your nighttime routine.  Try to avoid taking naps during the day so that you sleep better at night.  Get out of bed if you are still awake after 15 minutes of trying to sleep. Keep the lights down, but try reading or doing a quiet activity. When you feel sleepy, go back to bed.   General instructions  Take over-the-counter and prescription medicines only as told by your health care provider.  Exercise regularly, as told by your health care provider. Avoid exercise starting several hours before bedtime.  Use relaxation techniques to manage stress. Ask your health care provider to suggest some techniques that may work well for you. These may include: ? Breathing exercises. ? Routines to release muscle tension. ? Visualizing peaceful scenes.  Make sure that you drive carefully. Avoid driving if you feel very sleepy.  Keep all follow-up visits as told by your health care provider. This is important. Contact a health care provider if:  You are tired throughout the day.  You have trouble in your daily routine due to sleepiness.  You continue to have sleep problems, or your sleep problems get worse. Get help right away if:  You have serious thoughts about hurting yourself or someone else. If you ever feel like you may hurt yourself or others, or have thoughts about taking your own life, get help right away. You can go to your nearest emergency department or call:  Your local emergency services (911 in the [place]  A suicide crisis helpline, such as the [facility] at 680-093-4558. This is open 24 hours a day. Summary  Insomnia is a sleep disorder that makes it difficult to fall asleep or stay  asleep.  Insomnia can be long-term (chronic) or short-term (acute).  Treatment for insomnia depends on the cause. Treatment may focus on treating an underlying condition that is causing insomnia.  Keep a sleep diary to help you and your health care provider figure out what could be causing your insomnia. This information is not intended to replace advice given to you by your health care provider. Make sure you discuss any questions you have with your health care provider. Document Revised: 02/28/2020 Document Reviewed: 02/28/2020 Elsevier Patient Education  2021 [employer].

## 2020-07-15 ENCOUNTER — Other Ambulatory Visit: Payer: Self-pay

## 2020-07-15 ENCOUNTER — Encounter: Payer: Self-pay | Admitting: Family Medicine

## 2020-07-15 ENCOUNTER — Ambulatory Visit: Payer: Medicare PPO | Admitting: Family Medicine

## 2020-07-15 VITALS — BP 136/80 | HR 84 | Ht 65.0 in | Wt 162.0 lb

## 2020-07-15 DIAGNOSIS — E7801 Familial hypercholesterolemia: Secondary | ICD-10-CM

## 2020-07-15 DIAGNOSIS — I10 Essential (primary) hypertension: Secondary | ICD-10-CM | POA: Diagnosis not present

## 2020-07-15 DIAGNOSIS — J449 Chronic obstructive pulmonary disease, unspecified: Secondary | ICD-10-CM | POA: Diagnosis not present

## 2020-07-15 DIAGNOSIS — E119 Type 2 diabetes mellitus without complications: Secondary | ICD-10-CM

## 2020-07-15 DIAGNOSIS — I679 Cerebrovascular disease, unspecified: Secondary | ICD-10-CM

## 2020-07-15 DIAGNOSIS — I2510 Atherosclerotic heart disease of native coronary artery without angina pectoris: Secondary | ICD-10-CM

## 2020-07-15 MED ORDER — LOSARTAN POTASSIUM 100 MG PO TABS: 100.0000 mg | ORAL_TABLET | Freq: Every day | ORAL | 1 refills | Status: DC

## 2020-07-15 MED ORDER — METOPROLOL TARTRATE 50 MG PO TABS: 50.0000 mg | ORAL_TABLET | Freq: Two times a day (BID) | ORAL | 1 refills | Status: DC

## 2020-07-15 MED ORDER — METFORMIN HCL 1000 MG PO TABS: ORAL_TABLET | ORAL | 1 refills | Status: DC

## 2020-07-15 MED ORDER — LOVASTATIN 40 MG PO TABS
40.0000 mg | ORAL_TABLET | Freq: Every day | ORAL | 1 refills | Status: DC
Start: 2020-07-15 — End: 2021-06-17

## 2020-07-15 MED ORDER — CLOPIDOGREL BISULFATE 75 MG PO TABS: 75.0000 mg | ORAL_TABLET | Freq: Every day | ORAL | 1 refills | Status: DC

## 2020-07-15 NOTE — Progress Notes (Signed)
Date:  07/15/2020   Name:  Beth Franco   DOB:  [birth year]   MRN:  [id number]   Chief Complaint: Hypertension (Switched to losartan from lisinopril) and Prediabetes  Hypertension This is a chronic problem. The current episode started more than 1 year ago. The problem has been waxing and waning since onset. The problem is controlled. Pertinent negatives include no anxiety, blurred vision, chest pain, headaches, malaise/fatigue, neck pain, orthopnea, palpitations, peripheral edema, PND or shortness of breath. There are no associated agents to hypertension. Risk factors for coronary artery disease include dyslipidemia. Past treatments include angiotensin blockers and beta blockers. The current treatment provides mild improvement. There are no compliance problems.  There is no history of angina, kidney disease, CAD/MI, CVA, heart failure, left ventricular hypertrophy, PVD or retinopathy.  Diabetes She presents for her follow-up diabetic visit. She has type 2 diabetes mellitus. Her disease course has been stable. There are no hypoglycemic associated symptoms. Pertinent negatives for hypoglycemia include no dizziness, headaches or nervousness/anxiousness. Pertinent negatives for diabetes include no blurred vision, no chest pain, no fatigue, no foot paresthesias, no foot ulcerations, no polydipsia, no polyphagia, no polyuria, no visual change, no weakness and no weight loss. There are no hypoglycemic complications. Symptoms are worsening. Pertinent negatives for diabetic complications include no CVA, PVD or retinopathy. Current diabetic treatment includes oral agent (dual therapy). She is compliant with treatment most of the time. She is following a generally healthy diet. Meal planning includes avoidance of concentrated sweets and carbohydrate counting. An ACE inhibitor/angiotensin II receptor blocker is being taken.    Lab Results  Component Value Date   CREATININE 1.01 (H) 02/12/2020   BUN 19  02/12/2020   NA 146 (H) 02/12/2020   K 4.8 02/12/2020   CL 104 02/12/2020   CO2 26 02/12/2020   Lab Results  Component Value Date   CHOL 189 02/12/2020   HDL 59 02/12/2020   LDLCALC 103 (H) 02/12/2020   TRIG 158 (H) 02/12/2020   No results found for: TSH Lab Results  Component Value Date   HGBA1C 5.8 (H) 02/12/2020   Lab Results  Component Value Date   WBC 6.7 04/08/2020   HGB 11.3 04/08/2020   HCT 36.4 04/08/2020   MCV 86 04/08/2020   PLT 341 04/08/2020   Lab Results  Component Value Date   ALT 14 02/12/2020   AST 15 02/12/2020   ALKPHOS 85 02/12/2020   BILITOT 0.2 02/12/2020     Review of Systems  Constitutional: Negative.  Negative for chills, fatigue, fever, malaise/fatigue, unexpected weight change and weight loss.  HENT: Negative for congestion, ear discharge, ear pain, rhinorrhea, sinus pressure, sneezing and sore throat.   Eyes: Negative for blurred vision, photophobia, pain, discharge, redness and itching.  Respiratory: Negative for cough, shortness of breath, wheezing and stridor.   Cardiovascular: Negative for chest pain, palpitations, orthopnea and PND.  Gastrointestinal: Negative for abdominal pain, blood in stool, constipation, diarrhea, nausea and vomiting.  Endocrine: Negative for cold intolerance, heat intolerance, polydipsia, polyphagia and polyuria.  Genitourinary: Negative for dysuria, flank pain, frequency, hematuria, menstrual problem, pelvic pain, urgency, vaginal bleeding and vaginal discharge.  Musculoskeletal: Negative for arthralgias, back pain, myalgias and neck pain.  Skin: Negative for rash.  Allergic/Immunologic: Negative for environmental allergies and food allergies.  Neurological: Negative for dizziness, weakness, light-headedness, numbness and headaches.  Hematological: Negative for adenopathy. Does not bruise/bleed easily.  Psychiatric/Behavioral: Negative for dysphoric mood. The patient is not nervous/anxious.  There are no  problems to display for this patient.   No Known Allergies  Past Surgical History:  Procedure Laterality Date  . ABDOMINAL HYSTERECTOMY    . BREAST SURGERY     lumpectomy  . CARDIAC SURGERY    . EYE SURGERY     cataract    Social History   Tobacco Use  . Smoking status: Current Every Day Smoker    Types: Cigarettes  . Smokeless tobacco: Never Used  Vaping Use  . Vaping Use: Never used  Substance Use Topics  . Alcohol use: Not Currently  . Drug use: Never     Medication list has been reviewed and updated.  Current Meds  Medication Sig  . albuterol (VENTOLIN HFA) 108 (90 Base) MCG/ACT inhaler Inhale 2 puffs into the lungs every 6 (six) hours as needed for wheezing or shortness of breath.  Marland Kitchen aspirin 325 MG tablet Take 325 mg by mouth daily.  . Calcium 200 MG TABS Take 1 tablet by mouth in the morning and at bedtime.  . clopidogrel (PLAVIX) 75 MG tablet Take 1 tablet by mouth once daily  . ferrous sulfate 325 (65 FE) MG EC tablet Take 325 mg by mouth daily with breakfast.  . losartan (COZAAR) 50 MG tablet TAKE 1 TABLET BY MOUTH ONCE DAILY  . lovastatin (MEVACOR) 40 MG tablet TAKE 1 TABLET BY MOUTH AT BEDTIME  . melatonin 1 MG TABS tablet Take 2 mg by mouth at bedtime.  . metFORMIN (GLUCOPHAGE) 1000 MG tablet TAKE 1 TABLET BY MOUTH TWICE DAILY BEFORE MEAL(S)  . metoprolol tartrate (LOPRESSOR) 50 MG tablet Take 1 tablet (50 mg total) by mouth 2 (two) times daily.  . Multiple Vitamins-Minerals (ONE-A-DAY WOMENS 50+ ADVANTAGE PO) Take 1 tablet by mouth daily.  . OXYGEN Inhale into the lungs at bedtime.  . [DISCONTINUED] aspirin EC 81 MG tablet Take 81 mg by mouth daily. Swallow whole.    PHQ 2/9 Scores 06/10/2020 02/12/2020  PHQ - 2 Score 0 0  PHQ- 9 Score 2 1    GAD 7 : Generalized Anxiety Score 06/10/2020 02/12/2020  Nervous, Anxious, on Edge 0 0  Control/stop worrying 0 0  Worry too much - different things 0 0  Trouble relaxing 0 0  Restless 0 0  Easily annoyed or  irritable 0 0  Afraid - awful might happen 0 0  Total GAD 7 Score 0 0    BP Readings from Last 3 Encounters:  07/15/20 136/80  06/10/20 (!) 160/70  04/16/20 (!) 160/54    Physical Exam Vitals and nursing note reviewed.  Constitutional:      Appearance: She is well-developed.  HENT:     Head: Normocephalic.     Right Ear: External ear normal.     Left Ear: External ear normal.  Eyes:     General: Lids are everted, no foreign bodies appreciated. No scleral icterus.       Left eye: No foreign body or hordeolum.     Conjunctiva/sclera: Conjunctivae normal.     Right eye: Right conjunctiva is not injected.     Left eye: Left conjunctiva is not injected.     Pupils: Pupils are equal, round, and reactive to light.  Neck:     Thyroid: No thyromegaly.     Vascular: No JVD.     Trachea: No tracheal deviation.  Cardiovascular:     Rate and Rhythm: Normal rate and regular rhythm.     Heart sounds: Normal heart sounds.  No murmur heard. No friction rub. No gallop.   Pulmonary:     Effort: Pulmonary effort is normal. No respiratory distress.     Breath sounds: Wheezing present. No rhonchi or rales.  Abdominal:     General: Bowel sounds are normal.     Palpations: Abdomen is soft. There is no mass.     Tenderness: There is no abdominal tenderness. There is no guarding or rebound.  Musculoskeletal:        General: No tenderness. Normal range of motion.     Cervical back: Normal range of motion and neck supple.  Lymphadenopathy:     Cervical: No cervical adenopathy.  Skin:    General: Skin is warm.     Findings: No rash.  Neurological:     Mental Status: She is alert and oriented to person, place, and time.     Cranial Nerves: No cranial nerve deficit.     Deep Tendon Reflexes: Reflexes normal.  Psychiatric:        Mood and Affect: Mood is not anxious or depressed.     Wt Readings from Last 3 Encounters:  07/15/20 162 lb (73.5 kg)  06/10/20 169 lb (76.7 kg)  04/16/20 160 lb  (72.6 kg)    BP 136/80   Pulse 84   Ht 5\' 5"  (1.651 m)   Wt 162 lb (73.5 kg)   BMI 26.96 kg/m   Assessment and Plan: 1. Hypertension, essential Chronic.  Controlled.  Stable.  Patient has not been taking medication as prescribed because she is continue to take her lisinopril twice a day along with her 50 mg losartan once a day.  Patient has continued her metoprolol 50 mg twice a day.  As mentioned we will continue metoprolol 50 mg twice a day but patient has been converted to losartan 100 mg once a day.  We will recheck patient's blood pressure and 4 months. - metoprolol tartrate (LOPRESSOR) 50 MG tablet; Take 1 tablet (50 mg total) by mouth 2 (two) times daily.  Dispense: 180 tablet; Refill: 1 - Renal Function Panel  2. Type 2 diabetes mellitus without complication, without long-term current use of insulin (HCC) Chronic.  Controlled.  Stable.  Continue Metformin 1 g twice a day.  Will check A1c. - HgB A1c - metFORMIN (GLUCOPHAGE) 1000 MG tablet; TAKE 1 TABLET BY MOUTH TWICE DAILY BEFORE MEAL(S)  Dispense: 180 tablet; Refill: 1 - Renal Function Panel  3. COPD, mild (HCC) Chronic.  Controlled.  Relatively stable.  Patient continues to smoke about a pack a day.  Patient has not been taking her albuterol and has been encouraged to begin to do so again.  Refill albuterol inhaler 1 to 2 puffs every 6 hours.  4. Familial hypercholesterolemia Chronic.  Controlled.  Stable.  Continue lovastatin 40 mg once a day. - lovastatin (MEVACOR) 40 MG tablet; Take 1 tablet (40 mg total) by mouth at bedtime.  Dispense: 90 tablet; Refill: 1  5. Coronary artery disease involving native coronary artery of native heart without angina pectoris Chronic.  Controlled.  Stable.  Patient's had no anginal episodes.  We will continue Plavix 75 mg once a day. - clopidogrel (PLAVIX) 75 MG tablet; Take 1 tablet (75 mg total) by mouth daily.  Dispense: 90 tablet; Refill: 1  6. Cerebrovascular disease,  unspecified Chronic.  Controlled.  Stable.  No episodes suggesting a CVA/TIA.  We will continue Plavix 75 mg once a day. - clopidogrel (PLAVIX) 75 MG tablet; Take 1 tablet (75  mg total) by mouth daily.  Dispense: 90 tablet; Refill: 1

## 2020-07-16 LAB — RENAL FUNCTION PANEL
Albumin: 4.2 g/dL (ref 3.6–4.6)
BUN/Creatinine Ratio: 21 (ref 12–28)
BUN: 21 mg/dL (ref 8–27)
CO2: 23 mmol/L (ref 20–29)
Calcium: 10.4 mg/dL — ABNORMAL HIGH (ref 8.7–10.3)
Chloride: 104 mmol/L (ref 96–106)
Creatinine, Ser: 0.98 mg/dL (ref 0.57–1.00)
Glucose: 81 mg/dL (ref 65–99)
Phosphorus: 3.9 mg/dL (ref 3.0–4.3)
Potassium: 5.4 mmol/L — ABNORMAL HIGH (ref 3.5–5.2)
Sodium: 146 mmol/L — ABNORMAL HIGH (ref 134–144)
eGFR: 57 mL/min/{1.73_m2} — ABNORMAL LOW (ref >59)

## 2020-07-16 LAB — HEMOGLOBIN A1C
Est. average glucose Bld gHb Est-mCnc: 114 mg/dL
Hgb A1c MFr Bld: 5.6 % (ref 4.8–5.6)

## 2020-11-18 ENCOUNTER — Ambulatory Visit: Payer: Medicare PPO | Admitting: Family Medicine

## 2020-11-24 ENCOUNTER — Ambulatory Visit: Payer: Medicare PPO | Admitting: Family Medicine

## 2020-11-24 ENCOUNTER — Encounter: Payer: Self-pay | Admitting: Family Medicine

## 2020-11-24 ENCOUNTER — Other Ambulatory Visit: Payer: Self-pay

## 2020-11-24 VITALS — BP 130/64 | HR 72 | Ht 65.0 in | Wt 164.0 lb

## 2020-11-24 DIAGNOSIS — I679 Cerebrovascular disease, unspecified: Secondary | ICD-10-CM | POA: Diagnosis not present

## 2020-11-24 DIAGNOSIS — E119 Type 2 diabetes mellitus without complications: Secondary | ICD-10-CM | POA: Diagnosis not present

## 2020-11-24 DIAGNOSIS — E875 Hyperkalemia: Secondary | ICD-10-CM | POA: Diagnosis not present

## 2020-11-24 DIAGNOSIS — D649 Anemia, unspecified: Secondary | ICD-10-CM

## 2020-11-24 MED ORDER — METFORMIN HCL 1000 MG PO TABS: ORAL_TABLET | ORAL | 0 refills | Status: DC

## 2020-11-24 NOTE — Progress Notes (Signed)
Date:  11/24/2020   Name:  Beth Franco   DOB:  [birth year]   MRN:  [id number]   Chief Complaint: Diabetes  Diabetes She presents for her follow-up diabetic visit. She has type 2 diabetes mellitus. Her disease course has been stable. There are no hypoglycemic associated symptoms. Pertinent negatives for hypoglycemia include no dizziness, headaches or nervousness/anxiousness. There are no diabetic associated symptoms. Pertinent negatives for diabetes include no blurred vision, no chest pain, no fatigue, no foot paresthesias, no foot ulcerations, no polydipsia, no polyphagia, no polyuria, no visual change, no weakness and no weight loss. There are no hypoglycemic complications. Symptoms are stable. Diabetic complications include a CVA. Pertinent negatives for diabetic complications include no peripheral neuropathy.   Lab Results  Component Value Date   CREATININE 0.98 07/15/2020   BUN 21 07/15/2020   NA 146 (H) 07/15/2020   K 5.4 (H) 07/15/2020   CL 104 07/15/2020   CO2 23 07/15/2020   Lab Results  Component Value Date   CHOL 189 02/12/2020   HDL 59 02/12/2020   LDLCALC 103 (H) 02/12/2020   TRIG 158 (H) 02/12/2020   No results found for: TSH Lab Results  Component Value Date   HGBA1C 5.6 07/15/2020   Lab Results  Component Value Date   WBC 6.7 04/08/2020   HGB 11.3 04/08/2020   HCT 36.4 04/08/2020   MCV 86 04/08/2020   PLT 341 04/08/2020   Lab Results  Component Value Date   ALT 14 02/12/2020   AST 15 02/12/2020   ALKPHOS 85 02/12/2020   BILITOT 0.2 02/12/2020     Review of Systems  Constitutional:  Negative for chills, fatigue, fever and weight loss.  HENT:  Negative for drooling, ear discharge, ear pain and sore throat.   Eyes:  Negative for blurred vision.  Respiratory:  Negative for cough, shortness of breath and wheezing.   Cardiovascular:  Negative for chest pain, palpitations and leg swelling.  Gastrointestinal:  Negative for abdominal pain, blood in  stool, constipation, diarrhea and nausea.  Endocrine: Negative for polydipsia, polyphagia and polyuria.  Genitourinary:  Negative for dysuria, frequency, hematuria and urgency.  Musculoskeletal:  Negative for back pain, myalgias and neck pain.  Skin:  Negative for rash.  Allergic/Immunologic: Negative for environmental allergies.  Neurological:  Negative for dizziness, weakness and headaches.  Hematological:  Negative for adenopathy. Does not bruise/bleed easily.  Psychiatric/Behavioral:  Negative for suicidal ideas. The patient is not nervous/anxious.    There are no problems to display for this patient.   No Known Allergies  Past Surgical History:  Procedure Laterality Date   ABDOMINAL HYSTERECTOMY     BREAST SURGERY     lumpectomy   CARDIAC SURGERY     EYE SURGERY     cataract    Social History   Tobacco Use   Smoking status: Every Day    Types: Cigarettes   Smokeless tobacco: Never  Vaping Use   Vaping Use: Never used  Substance Use Topics   Alcohol use: Not Currently   Drug use: Never     Medication list has been reviewed and updated.  Current Meds  Medication Sig   albuterol (VENTOLIN HFA) 108 (90 Base) MCG/ACT inhaler Inhale 2 puffs into the lungs every 6 (six) hours as needed for wheezing or shortness of breath.   aspirin 325 MG tablet Take 325 mg by mouth daily.   Calcium 200 MG TABS Take 1 tablet by mouth in the morning and at  bedtime.   clopidogrel (PLAVIX) 75 MG tablet Take 1 tablet (75 mg total) by mouth daily.   ferrous sulfate 325 (65 FE) MG EC tablet Take 325 mg by mouth daily with breakfast.   losartan (COZAAR) 100 MG tablet Take 1 tablet (100 mg total) by mouth daily.   lovastatin (MEVACOR) 40 MG tablet Take 1 tablet (40 mg total) by mouth at bedtime.   melatonin 1 MG TABS tablet Take 2 mg by mouth at bedtime.   metFORMIN (GLUCOPHAGE) 1000 MG tablet TAKE 1 TABLET BY MOUTH TWICE DAILY BEFORE MEAL(S)   metoprolol tartrate (LOPRESSOR) 50 MG tablet  Take 1 tablet (50 mg total) by mouth 2 (two) times daily.   Multiple Vitamins-Minerals (ONE-A-DAY WOMENS 50+ ADVANTAGE PO) Take 1 tablet by mouth daily.   OXYGEN Inhale into the lungs at bedtime.    PHQ 2/9 Scores 11/24/2020 06/10/2020 02/12/2020  PHQ - 2 Score 0 0 0  PHQ- 9 Score 0 2 1    GAD 7 : Generalized Anxiety Score 11/24/2020 06/10/2020 02/12/2020  Nervous, Anxious, on Edge 0 0 0  Control/stop worrying 0 0 0  Worry too much - different things 0 0 0  Trouble relaxing 0 0 0  Restless 0 0 0  Easily annoyed or irritable 0 0 0  Afraid - awful might happen 0 0 0  Total GAD 7 Score 0 0 0    BP Readings from Last 3 Encounters:  11/24/20 130/64  07/15/20 136/80  06/10/20 (!) 160/70    Physical Exam Vitals and nursing note reviewed.  Constitutional:      General: She is not in acute distress.    Appearance: She is not diaphoretic.  HENT:     Head: Normocephalic and atraumatic.     Right Ear: Tympanic membrane, ear canal and external ear normal.     Left Ear: Tympanic membrane, ear canal and external ear normal.     Nose: Nose normal. No congestion or rhinorrhea.     Mouth/Throat:     Pharynx: Oropharynx is clear.  Eyes:     General:        Right eye: No discharge.        Left eye: No discharge.     Conjunctiva/sclera: Conjunctivae normal.     Pupils: Pupils are equal, round, and reactive to light.  Neck:     Thyroid: No thyromegaly.     Vascular: No JVD.  Cardiovascular:     Rate and Rhythm: Normal rate and regular rhythm.     Heart sounds: Normal heart sounds. No murmur heard.   No friction rub. No gallop.  Pulmonary:     Effort: Pulmonary effort is normal.     Breath sounds: Wheezing present. No rhonchi.  Abdominal:     General: Bowel sounds are normal.     Palpations: Abdomen is soft. There is no mass.     Tenderness: There is no abdominal tenderness. There is no guarding.  Musculoskeletal:        General: Normal range of motion.     Cervical back: Normal range  of motion and neck supple.  Lymphadenopathy:     Cervical: No cervical adenopathy.  Skin:    General: Skin is warm and dry.  Neurological:     Mental Status: She is alert.     Deep Tendon Reflexes: Reflexes are normal and symmetric.    Wt Readings from Last 3 Encounters:  11/24/20 164 lb (74.4 kg)  07/15/20 162 lb (73.5  kg)  06/10/20 169 lb (76.7 kg)    BP 130/64   Pulse 72   Ht '5\' 5"'$  (1.651 m)   Wt 164 lb (74.4 kg)   BMI 27.29 kg/m   Assessment and Plan:  1. Type 2 diabetes mellitus without complication, without long-term current use of insulin (HCC) Chronic.  Controlled.  Stable.  Continue metformin 1 g twice a day.  Will check A1c for current status of control. - HgB A1c - metFORMIN (GLUCOPHAGE) 1000 MG tablet; TAKE 1 TABLET BY MOUTH TWICE DAILY BEFORE MEAL(S)  Dispense: 180 tablet; Refill: 0  2. Hyperkalemia Episodic.  Stable.  Mildly elevated on the last CMP.  We will recheck renal function for electrolyte status. - Renal function panel  3. Cerebrovascular disease, unspecified Chronic.  Controlled.  Stable.  Patient is currently stable and will continue Plavix and low-dose aspirin.  4. Anemia, unspecified type Chronic stable and uncomplicated we will check hematocrit and hemoglobin for current status of anemia - Hemoglobin and hematocrit, blood

## 2020-11-25 LAB — RENAL FUNCTION PANEL
Albumin: 4.1 g/dL (ref 3.6–4.6)
BUN/Creatinine Ratio: 23 (ref 12–28)
BUN: 24 mg/dL (ref 8–27)
CO2: 26 mmol/L (ref 20–29)
Calcium: 9.9 mg/dL (ref 8.7–10.3)
Chloride: 106 mmol/L (ref 96–106)
Creatinine, Ser: 1.03 mg/dL — ABNORMAL HIGH (ref 0.57–1.00)
Glucose: 98 mg/dL (ref 65–99)
Phosphorus: 3.9 mg/dL (ref 3.0–4.3)
Potassium: 5.5 mmol/L — ABNORMAL HIGH (ref 3.5–5.2)
Sodium: 147 mmol/L — ABNORMAL HIGH (ref 134–144)
eGFR: 54 mL/min/{1.73_m2} — ABNORMAL LOW (ref >59)

## 2020-11-25 LAB — HEMOGLOBIN A1C
Est. average glucose Bld gHb Est-mCnc: 126 mg/dL
Hgb A1c MFr Bld: 6 % — ABNORMAL HIGH (ref 4.8–5.6)

## 2020-11-25 LAB — HEMOGLOBIN AND HEMATOCRIT, BLOOD
Hematocrit: 39 % (ref 34.0–46.6)
Hemoglobin: 12.7 g/dL (ref 11.1–15.9)

## 2020-12-15 ENCOUNTER — Telehealth: Payer: Self-pay | Admitting: Family Medicine

## 2020-12-15 NOTE — Telephone Encounter (Signed)
Copied from [place] [id number]. Topic: Medicare AWV >> Dec 15, 2020  1:25 PM Cher Nakai R wrote: Reason for CRM:  Left message for patient to call back and schedule Medicare Annual Wellness Visit (AWV) in office.   If unable to come into the office for AWV,  please offer to do virtually or by telephone.  No hx of AWV eligible for AWVI as of 05/03/2009  Please schedule at anytime with [facility] Advisor.      40 Minutes appointment   Any questions, please call me at [phone]

## 2021-01-11 ENCOUNTER — Emergency Department (HOSPITAL_COMMUNITY)
Admission: EM | Admit: 2021-01-11 | Discharge: 2021-01-12 | Disposition: A | Discharge status: Home / Self Care | Payer: Medicare PPO | Attending: Emergency Medicine | Admitting: Emergency Medicine

## 2021-01-11 ENCOUNTER — Encounter (HOSPITAL_COMMUNITY): Payer: Self-pay | Admitting: Emergency Medicine

## 2021-01-11 ENCOUNTER — Emergency Department (HOSPITAL_COMMUNITY): Payer: Medicare PPO

## 2021-01-11 ENCOUNTER — Other Ambulatory Visit: Payer: Self-pay

## 2021-01-11 DIAGNOSIS — R531 Weakness: Secondary | ICD-10-CM | POA: Diagnosis not present

## 2021-01-11 DIAGNOSIS — R0689 Other abnormalities of breathing: Secondary | ICD-10-CM | POA: Insufficient documentation

## 2021-01-11 DIAGNOSIS — Z859 Personal history of malignant neoplasm, unspecified: Secondary | ICD-10-CM | POA: Diagnosis not present

## 2021-01-11 DIAGNOSIS — R059 Cough, unspecified: Secondary | ICD-10-CM | POA: Insufficient documentation

## 2021-01-11 DIAGNOSIS — Z20822 Contact with and (suspected) exposure to covid-19: Secondary | ICD-10-CM | POA: Diagnosis not present

## 2021-01-11 DIAGNOSIS — J449 Chronic obstructive pulmonary disease, unspecified: Secondary | ICD-10-CM | POA: Diagnosis not present

## 2021-01-11 DIAGNOSIS — S6991XA Unspecified injury of right wrist, hand and finger(s), initial encounter: Secondary | ICD-10-CM | POA: Diagnosis present

## 2021-01-11 DIAGNOSIS — Z7951 Long term (current) use of inhaled steroids: Secondary | ICD-10-CM | POA: Diagnosis not present

## 2021-01-11 DIAGNOSIS — I2510 Atherosclerotic heart disease of native coronary artery without angina pectoris: Secondary | ICD-10-CM | POA: Insufficient documentation

## 2021-01-11 DIAGNOSIS — I10 Essential (primary) hypertension: Secondary | ICD-10-CM | POA: Insufficient documentation

## 2021-01-11 DIAGNOSIS — S61511A Laceration without foreign body of right wrist, initial encounter: Secondary | ICD-10-CM | POA: Diagnosis not present

## 2021-01-11 DIAGNOSIS — Z7984 Long term (current) use of oral hypoglycemic drugs: Secondary | ICD-10-CM | POA: Insufficient documentation

## 2021-01-11 DIAGNOSIS — R410 Disorientation, unspecified: Secondary | ICD-10-CM | POA: Diagnosis not present

## 2021-01-11 DIAGNOSIS — F17210 Nicotine dependence, cigarettes, uncomplicated: Secondary | ICD-10-CM | POA: Diagnosis not present

## 2021-01-11 DIAGNOSIS — Z7982 Long term (current) use of aspirin: Secondary | ICD-10-CM | POA: Insufficient documentation

## 2021-01-11 DIAGNOSIS — Z79899 Other long term (current) drug therapy: Secondary | ICD-10-CM | POA: Diagnosis not present

## 2021-01-11 DIAGNOSIS — S1083XA Contusion of other specified part of neck, initial encounter: Secondary | ICD-10-CM | POA: Insufficient documentation

## 2021-01-11 DIAGNOSIS — R6510 Systemic inflammatory response syndrome (SIRS) of non-infectious origin without acute organ dysfunction: Secondary | ICD-10-CM | POA: Diagnosis not present

## 2021-01-11 DIAGNOSIS — Y92009 Unspecified place in unspecified non-institutional (private) residence as the place of occurrence of the external cause: Secondary | ICD-10-CM | POA: Insufficient documentation

## 2021-01-11 DIAGNOSIS — W010XXA Fall on same level from slipping, tripping and stumbling without subsequent striking against object, initial encounter: Secondary | ICD-10-CM | POA: Diagnosis not present

## 2021-01-11 DIAGNOSIS — A419 Sepsis, unspecified organism: Secondary | ICD-10-CM

## 2021-01-11 DIAGNOSIS — E119 Type 2 diabetes mellitus without complications: Secondary | ICD-10-CM | POA: Insufficient documentation

## 2021-01-11 DIAGNOSIS — W19XXXA Unspecified fall, initial encounter: Secondary | ICD-10-CM

## 2021-01-11 MED ORDER — ACETAMINOPHEN 650 MG RE SUPP
650.0000 mg | Freq: Once | RECTAL | Status: AC
  Administered 2021-01-12: 650 mg via RECTAL
  Filled 2021-01-11: qty 1

## 2021-01-11 MED ORDER — LACTATED RINGERS IV SOLN: INTRAVENOUS | Status: DC

## 2021-01-11 MED ORDER — SODIUM CHLORIDE 0.9 % IV BOLUS (SEPSIS)
1000.0000 mL | Freq: Once | INTRAVENOUS | Status: AC
  Administered 2021-01-12: 1000 mL via INTRAVENOUS

## 2021-01-11 NOTE — ED Triage Notes (Addendum)
[facility] - pt from home and was found on the floor. Family states that she is more confused that normal and was more weak on the right side. Pt has residual right sided weakness from a previous stroke. O2 was in mid 80's and was cyanotic around her mouth for EMS upon arrival. CBG 220

## 2021-01-11 NOTE — ED Provider Notes (Signed)
[facility] EMERGENCY DEPARTMENT Provider Note   CSN: [id number] Arrival date & time: 01/11/21  2318     History Chief Complaint  Patient presents with   Beth Franco is a 86 y.o. female.  86 y/o female with hx of HTN, DM, HLD, COPD, CAD, CVA w/residual R sided weakness, tobacco use presents to the ED after found down on the floor at home. Patient believes she tripped on her way to bed this evening. Unclear head trauma or LOC. She reports living with her daughter. Family stated to EMS that she has been more confused than normal; subjectively also more weak on the R side. EMS found the patient to be hypoxic upon their arrival with sats in the 80's. Patient states that she is on chronic 2L O2 via [place] at nighttime. It is unclear if she was wearing her O2 when EMS found the patient. CBG in the field 220. Patient without c/o pain at present. Has chronic cough which she does not feel is worse than baseline. She has no other complaints.  On chronic Plavix. No other anticoagulation.   Fall      Past Medical History:  Diagnosis Date   Cancer [facility] [address])    COPD (chronic obstructive pulmonary disease) ([place])    Coronary artery disease    Diabetes mellitus without complication ([place])    Hypercholesterolemia    Hypertension     There are no problems to display for this patient.   Past Surgical History:  Procedure Laterality Date   ABDOMINAL HYSTERECTOMY     BREAST SURGERY     lumpectomy   CARDIAC SURGERY     EYE SURGERY     cataract     OB History   No obstetric history on file.     Family History  Problem Relation Age of Onset   Cancer Father     Social History   Tobacco Use   Smoking status: Every Day    Types: Cigarettes   Smokeless tobacco: Never  Vaping Use   Vaping Use: Never used  Substance Use Topics   Alcohol use: Not Currently   Drug use: Never    Home Medications Prior to Admission medications   Medication Sig Start Date End  Date Taking? Authorizing Provider  albuterol (VENTOLIN HFA) 108 (90 Base) MCG/ACT inhaler Inhale 2 puffs into the lungs every 6 (six) hours as needed for wheezing or shortness of breath. 02/12/20   Juline Patch, MD  aspirin 325 MG tablet Take 325 mg by mouth daily.    [provider]  Calcium 200 MG TABS Take 1 tablet by mouth in the morning and at bedtime.    [provider]  clopidogrel (PLAVIX) 75 MG tablet Take 1 tablet (75 mg total) by mouth daily. 07/15/20   Juline Patch, MD  ferrous sulfate 325 (65 FE) MG EC tablet Take 325 mg by mouth daily with breakfast.    [provider]  losartan (COZAAR) 100 MG tablet Take 1 tablet (100 mg total) by mouth daily. 07/15/20   Juline Patch, MD  lovastatin (MEVACOR) 40 MG tablet Take 1 tablet (40 mg total) by mouth at bedtime. 07/15/20   Juline Patch, MD  melatonin 1 MG TABS tablet Take 2 mg by mouth at bedtime.    [provider]  metFORMIN (GLUCOPHAGE) 1000 MG tablet TAKE 1 TABLET BY MOUTH TWICE DAILY BEFORE MEAL(S) 11/24/20   Juline Patch, MD  metoprolol  tartrate (LOPRESSOR) 50 MG tablet Take 1 tablet (50 mg total) by mouth 2 (two) times daily. 07/15/20   Juline Patch, MD  Multiple Vitamins-Minerals (ONE-A-DAY WOMENS 50+ ADVANTAGE PO) Take 1 tablet by mouth daily.    [provider]  OXYGEN Inhale into the lungs at bedtime.    [provider]    Allergies    Patient has no known allergies.  Review of Systems   Review of Systems Ten systems reviewed and are negative for acute change, except as noted in the HPI.    Physical Exam Updated Vital Signs BP (!) 179/69 (BP Location: Left Arm)   Pulse 82   Temp (!) 101.2 F (38.4 C) (Oral)   Resp (!) 24   Ht '5\' 5"'$  (1.651 m)   Wt 74.4 kg   SpO2 96%   BMI 27.29 kg/m   Physical Exam Vitals and nursing note reviewed.  Constitutional:      Comments: Nontoxic appearing and in NAD. Alert.  HENT:     Head:     Comments: No battle's  sign or raccoon's eyes.    Right Ear: External ear normal.     Left Ear: External ear normal.  Eyes:     Extraocular Movements: Extraocular movements intact.     Conjunctiva/sclera: Conjunctivae normal.  Cardiovascular:     Rate and Rhythm: Normal rate and regular rhythm.     Pulses: Normal pulses.  Pulmonary:     Effort: No respiratory distress.     Breath sounds: Normal breath sounds.     Comments: Congested, nonproductive cough. SpO2 96% on 2L via [place]. Adventitious breath sounds c/w smoking hx. No rales noted. Abdominal:     General: There is no distension.     Palpations: Abdomen is soft.  Musculoskeletal:     Cervical back: Normal range of motion.  Skin:    Comments: Skin tear of R wrist. Hematoma of right anterior neck; unclear chronicity.  Neurological:     Mental Status: She is alert.     Coordination: Coordination normal.     Comments: Oriented to self, year. Has difficulty stating the city and does not know the hospital she is at. Speech is clear, goal oriented. Follows 2 part commands. Has 4/5 strength in the RUE against resistance with only 2+/5 strength in the RLE. Hx of residual R sided deficits from prior CVA. No other focal findings noted.    ED Results / Procedures / Treatments   Labs (all labs ordered are listed, but only abnormal results are displayed) Labs Reviewed  COMPREHENSIVE METABOLIC PANEL - Abnormal; Notable for the following components:      Result Value   Glucose, Bld 131 (*)    Creatinine, Ser 1.07 (*)    Albumin 3.0 (*)    GFR, Estimated 51 (*)    All other components within normal limits  CBC WITH DIFFERENTIAL/PLATELET - Abnormal; Notable for the following components:   RBC 3.69 (*)    Hemoglobin 10.8 (*)    HCT 35.4 (*)    All other components within normal limits  URINALYSIS, ROUTINE W REFLEX MICROSCOPIC - Abnormal; Notable for the following components:   Ketones, ur 15 (*)    Leukocytes,Ua TRACE (*)    All other components within normal  limits  CK - Abnormal; Notable for the following components:   Total CK 31 (*)    All other components within normal limits  BRAIN NATRIURETIC PEPTIDE - Abnormal; Notable for the following components:  B Natriuretic Peptide 634.2 (*)    All other components within normal limits  CBG MONITORING, ED - Abnormal; Notable for the following components:   Glucose-Capillary 123 (*)    All other components within normal limits  RESP PANEL BY RT-PCR (FLU A&B, COVID) ARPGX2  CULTURE, BLOOD (ROUTINE X 2)  CULTURE, BLOOD (ROUTINE X 2)  URINE CULTURE  LACTIC ACID, PLASMA  PROTIME-INR  URINALYSIS, MICROSCOPIC (REFLEX)    EKG None  Radiology CT HEAD WO CONTRAST (5MM)  Result Date: 01/12/2021 CLINICAL DATA:  Found down, confusion EXAM: CT HEAD WITHOUT CONTRAST CT CERVICAL SPINE WITHOUT CONTRAST TECHNIQUE: Multidetector CT imaging of the head and cervical spine was performed following the standard protocol without intravenous contrast. Multiplanar CT image reconstructions of the cervical spine were also generated. COMPARISON:  None. FINDINGS: CT HEAD FINDINGS Brain: No evidence of acute infarction, hemorrhage, hydrocephalus, extra-axial collection or mass lesion/mass effect. Subcortical white matter and periventricular small vessel ischemic changes. Vascular: Intracranial atherosclerosis. Skull: Normal. Negative for fracture or focal lesion. Sinuses/Orbits: The visualized paranasal sinuses are essentially clear. The mastoid air cells are unopacified. Other: None. CT CERVICAL SPINE FINDINGS Alignment: Normal cervical lordosis. Skull base and vertebrae: No acute fracture. No primary bone lesion or focal pathologic process. Soft tissues and spinal canal: No prevertebral fluid or swelling. No visible canal hematoma. Disc levels: Mild degenerative changes of the mid cervical spine. Spinal canal is patent. Upper chest: Visualized lung apices are notable for layering small pleural effusions. Other: None.  IMPRESSION: No evidence of acute intracranial abnormality. Small vessel ischemic changes. No evidence of traumatic injury to the cervical spine. Mild degenerative changes. Small layering pleural effusions. Electronically Signed   By: Julian Hy M.D.   On: 01/12/2021 01:00   CT Cervical Spine Wo Contrast  Result Date: 01/12/2021 CLINICAL DATA:  Found down, confusion EXAM: CT HEAD WITHOUT CONTRAST CT CERVICAL SPINE WITHOUT CONTRAST TECHNIQUE: Multidetector CT imaging of the head and cervical spine was performed following the standard protocol without intravenous contrast. Multiplanar CT image reconstructions of the cervical spine were also generated. COMPARISON:  None. FINDINGS: CT HEAD FINDINGS Brain: No evidence of acute infarction, hemorrhage, hydrocephalus, extra-axial collection or mass lesion/mass effect. Subcortical white matter and periventricular small vessel ischemic changes. Vascular: Intracranial atherosclerosis. Skull: Normal. Negative for fracture or focal lesion. Sinuses/Orbits: The visualized paranasal sinuses are essentially clear. The mastoid air cells are unopacified. Other: None. CT CERVICAL SPINE FINDINGS Alignment: Normal cervical lordosis. Skull base and vertebrae: No acute fracture. No primary bone lesion or focal pathologic process. Soft tissues and spinal canal: No prevertebral fluid or swelling. No visible canal hematoma. Disc levels: Mild degenerative changes of the mid cervical spine. Spinal canal is patent. Upper chest: Visualized lung apices are notable for layering small pleural effusions. Other: None. IMPRESSION: No evidence of acute intracranial abnormality. Small vessel ischemic changes. No evidence of traumatic injury to the cervical spine. Mild degenerative changes. Small layering pleural effusions. Electronically Signed   By: Julian Hy M.D.   On: 01/12/2021 01:00   DG Chest Port 1 View  Result Date: 01/12/2021 CLINICAL DATA:  Found down, confusion EXAM:  PORTABLE CHEST 1 VIEW COMPARISON:  None. FINDINGS: Increased interstitial markings. No definite pleural effusions. No pneumothorax. Cardiomegaly.  Thoracic aortic atherosclerosis. IMPRESSION: Cardiomegaly with suspected mild interstitial edema. No definite pleural effusions. Electronically Signed   By: Julian Hy M.D.   On: 01/12/2021 00:49    Procedures Procedures   Medications Ordered in ED Medications  lactated ringers  infusion (has no administration in time range)  sodium chloride 0.9 % bolus 1,000 mL (has no administration in time range)  acetaminophen (TYLENOL) suppository 650 mg (has no administration in time range)    ED Course  I have reviewed the triage vital signs and the nursing notes.  Pertinent labs & imaging results that were available during my care of the patient were reviewed by me and considered in my medical decision making (see chart for details).  Clinical Course as of 01/25/21 2250  Mon Jan 12, 2021  0345 Extremely well appearing, in NAD. Reviewed patient's work up. She verbalizes understanding of results. Discussed plan for ambulation in the department prior to anticipated d/c. She reports use of a walker at home. Will attempt to use this or an equivalent while ambulating.   Clinically, no source WRT fever. CXR negative for PNA. Only trace WBCs on UA without bacteria, nitrites; culture pending. COVID, Flu negative. No leukocytosis, tachycardia. Lactate normal. Low suspicion for sepsis, clinically. Advised Tylenol q 4-6 hours PRN. [id number] Patient ambulated in the ED with assist device. [KH]    Clinical Course User Index [KH] Beverely Pace   MDM Rules/Calculators/A&P                           86 year old female presents to the emergency department after a fall at home.  She has no complaints of pain despite her fall.  Was reportedly more confused than normal, but has been able to answer all questions appropriately in the emergency department.  No  focal neurologic deficits on exam.  Head CT and neck CT negative for acute intracranial or traumatic pathology.  Found to be febrile on arrival to the ED.  Fever is of unclear etiology as work-up today does not reveal a source of fever.  UA negative for UTI.  CXR without concern for PNA.  Respiratory panel, COVID negative.  No leukocytosis.  Question viral illness.  Fever has defervesced appropriately with Tylenol.  Vitals have remained stable.  Does not meet criteria for sepsis at this time.  Blood cultures are pending.  Patient was reportedly hypoxic with EMS, but is supposed to be on 2 L O2 and was not wearing this at the time EMS arrived.  Her oxygen saturations have been 96% or above on her home oxygen at 2 L via nasal cannula.  Patient has been ambulate in the ED with assist device which is her baseline.  Feel she is appropriate for discharge and outpatient PCP follow up.  Return precautions discussed and provided. Patient discharged in stable condition with no unaddressed concerns.   Final Clinical Impression(s) / ED Diagnoses Final diagnoses:  Fall in home, initial encounter  SIRS (systemic inflammatory response syndrome) [facility])    Rx / DC Orders ED Discharge Orders     None        Beverely Pace [id number]    Delora Fuel, MD [id number]

## 2021-01-12 ENCOUNTER — Emergency Department (HOSPITAL_COMMUNITY): Payer: Medicare PPO

## 2021-01-12 ENCOUNTER — Other Ambulatory Visit: Payer: Self-pay

## 2021-01-12 ENCOUNTER — Ambulatory Visit: Payer: Self-pay

## 2021-01-12 ENCOUNTER — Emergency Department: Payer: Medicare PPO

## 2021-01-12 ENCOUNTER — Inpatient Hospital Stay
Admission: EM | Admit: 2021-01-12 | Discharge: 2021-01-19 | DRG: 193 | Disposition: A | Discharge status: Skilled Nursing Facility | Payer: Medicare PPO | Attending: Internal Medicine | Admitting: Internal Medicine

## 2021-01-12 DIAGNOSIS — I248 Other forms of acute ischemic heart disease: Secondary | ICD-10-CM | POA: Diagnosis present

## 2021-01-12 DIAGNOSIS — I110 Hypertensive heart disease with heart failure: Secondary | ICD-10-CM | POA: Diagnosis present

## 2021-01-12 DIAGNOSIS — I10 Essential (primary) hypertension: Secondary | ICD-10-CM

## 2021-01-12 DIAGNOSIS — E7800 Pure hypercholesterolemia, unspecified: Secondary | ICD-10-CM | POA: Diagnosis present

## 2021-01-12 DIAGNOSIS — I350 Nonrheumatic aortic (valve) stenosis: Secondary | ICD-10-CM | POA: Diagnosis present

## 2021-01-12 DIAGNOSIS — E785 Hyperlipidemia, unspecified: Secondary | ICD-10-CM

## 2021-01-12 DIAGNOSIS — E1169 Type 2 diabetes mellitus with other specified complication: Secondary | ICD-10-CM | POA: Diagnosis present

## 2021-01-12 DIAGNOSIS — J189 Pneumonia, unspecified organism: Secondary | ICD-10-CM | POA: Diagnosis not present

## 2021-01-12 DIAGNOSIS — D519 Vitamin B12 deficiency anemia, unspecified: Secondary | ICD-10-CM | POA: Diagnosis present

## 2021-01-12 DIAGNOSIS — J440 Chronic obstructive pulmonary disease with acute lower respiratory infection: Secondary | ICD-10-CM | POA: Diagnosis present

## 2021-01-12 DIAGNOSIS — Z955 Presence of coronary angioplasty implant and graft: Secondary | ICD-10-CM

## 2021-01-12 DIAGNOSIS — Z90710 Acquired absence of both cervix and uterus: Secondary | ICD-10-CM

## 2021-01-12 DIAGNOSIS — F17210 Nicotine dependence, cigarettes, uncomplicated: Secondary | ICD-10-CM | POA: Diagnosis present

## 2021-01-12 DIAGNOSIS — I5033 Acute on chronic diastolic (congestive) heart failure: Secondary | ICD-10-CM | POA: Diagnosis present

## 2021-01-12 DIAGNOSIS — Z7902 Long term (current) use of antithrombotics/antiplatelets: Secondary | ICD-10-CM

## 2021-01-12 DIAGNOSIS — F0390 Unspecified dementia without behavioral disturbance: Secondary | ICD-10-CM | POA: Diagnosis present

## 2021-01-12 DIAGNOSIS — Z66 Do not resuscitate: Secondary | ICD-10-CM | POA: Diagnosis present

## 2021-01-12 DIAGNOSIS — R531 Weakness: Secondary | ICD-10-CM

## 2021-01-12 DIAGNOSIS — E876 Hypokalemia: Secondary | ICD-10-CM | POA: Diagnosis present

## 2021-01-12 DIAGNOSIS — I69351 Hemiplegia and hemiparesis following cerebral infarction affecting right dominant side: Secondary | ICD-10-CM

## 2021-01-12 DIAGNOSIS — Z20822 Contact with and (suspected) exposure to covid-19: Secondary | ICD-10-CM | POA: Diagnosis present

## 2021-01-12 DIAGNOSIS — I2510 Atherosclerotic heart disease of native coronary artery without angina pectoris: Secondary | ICD-10-CM | POA: Diagnosis present

## 2021-01-12 DIAGNOSIS — N390 Urinary tract infection, site not specified: Secondary | ICD-10-CM | POA: Diagnosis present

## 2021-01-12 DIAGNOSIS — Z7982 Long term (current) use of aspirin: Secondary | ICD-10-CM

## 2021-01-12 LAB — BASIC METABOLIC PANEL
Anion gap: 11 (ref 5–15)
BUN: 16 mg/dL (ref 8–23)
CO2: 26 mmol/L (ref 22–32)
Calcium: 8.6 mg/dL — ABNORMAL LOW (ref 8.9–10.3)
Chloride: 101 mmol/L (ref 98–111)
Creatinine, Ser: 0.92 mg/dL (ref 0.44–1.00)
GFR, Estimated: >60 mL/min (ref >60)
Glucose, Bld: 126 mg/dL — ABNORMAL HIGH (ref 70–99)
Potassium: 3.9 mmol/L (ref 3.5–5.1)
Sodium: 138 mmol/L (ref 135–145)

## 2021-01-12 LAB — BRAIN NATRIURETIC PEPTIDE
B Natriuretic Peptide: 634.2 pg/mL — ABNORMAL HIGH (ref 0.0–100.0)
B Natriuretic Peptide: 1111.3 pg/mL — ABNORMAL HIGH (ref 0.0–100.0)

## 2021-01-12 LAB — URINALYSIS, MICROSCOPIC (REFLEX): Bacteria, UA: NONE SEEN

## 2021-01-12 LAB — URINALYSIS, COMPLETE (UACMP) WITH MICROSCOPIC
Bacteria, UA: NONE SEEN
Bilirubin Urine: NEGATIVE
Glucose, UA: NEGATIVE mg/dL
Hgb urine dipstick: NEGATIVE
Ketones, ur: 20 mg/dL — AB
Leukocytes,Ua: NEGATIVE
Nitrite: NEGATIVE
Protein, ur: 30 mg/dL — AB
Specific Gravity, Urine: 1.016 (ref 1.005–1.030)
pH: 7 (ref 5.0–8.0)

## 2021-01-12 LAB — CBC WITH DIFFERENTIAL/PLATELET
Abs Immature Granulocytes: 0.04 10*3/uL (ref 0.00–0.07)
Basophils Absolute: 0 10*3/uL (ref 0.0–0.1)
Basophils Relative: 0 %
Eosinophils Absolute: 0 10*3/uL (ref 0.0–0.5)
Eosinophils Relative: 0 %
HCT: 35.4 % — ABNORMAL LOW (ref 36.0–46.0)
Hemoglobin: 10.8 g/dL — ABNORMAL LOW (ref 12.0–15.0)
Immature Granulocytes: 1 %
Lymphocytes Relative: 11 %
Lymphs Abs: 1 10*3/uL (ref 0.7–4.0)
MCH: 29.3 pg (ref 26.0–34.0)
MCHC: 30.5 g/dL (ref 30.0–36.0)
MCV: 95.9 fL (ref 80.0–100.0)
Monocytes Absolute: 0.6 10*3/uL (ref 0.1–1.0)
Monocytes Relative: 8 %
Neutro Abs: 6.8 10*3/uL (ref 1.7–7.7)
Neutrophils Relative %: 80 %
Platelets: 374 10*3/uL (ref 150–400)
RBC: 3.69 MIL/uL — ABNORMAL LOW (ref 3.87–5.11)
RDW: 14.6 % (ref 11.5–15.5)
WBC: 8.5 10*3/uL (ref 4.0–10.5)
nRBC: 0 % (ref 0.0–0.2)

## 2021-01-12 LAB — PROTIME-INR
INR: 1 (ref 0.8–1.2)
Prothrombin Time: 13.3 seconds (ref 11.4–15.2)

## 2021-01-12 LAB — CBG MONITORING, ED
Glucose-Capillary: 116 mg/dL — ABNORMAL HIGH (ref 70–99)
Glucose-Capillary: 123 mg/dL — ABNORMAL HIGH (ref 70–99)

## 2021-01-12 LAB — URINALYSIS, ROUTINE W REFLEX MICROSCOPIC
Bilirubin Urine: NEGATIVE
Glucose, UA: NEGATIVE mg/dL
Hgb urine dipstick: NEGATIVE
Ketones, ur: 15 mg/dL — AB
Nitrite: NEGATIVE
Protein, ur: NEGATIVE mg/dL
Specific Gravity, Urine: 1.015 (ref 1.005–1.030)
pH: 6 (ref 5.0–8.0)

## 2021-01-12 LAB — GLUCOSE, CAPILLARY
Glucose-Capillary: 150 mg/dL — ABNORMAL HIGH (ref 70–99)
Glucose-Capillary: 158 mg/dL — ABNORMAL HIGH (ref 70–99)

## 2021-01-12 LAB — CBC
HCT: 34.1 % — ABNORMAL LOW (ref 36.0–46.0)
Hemoglobin: 11 g/dL — ABNORMAL LOW (ref 12.0–15.0)
MCH: 30.4 pg (ref 26.0–34.0)
MCHC: 32.3 g/dL (ref 30.0–36.0)
MCV: 94.2 fL (ref 80.0–100.0)
Platelets: 366 10*3/uL (ref 150–400)
RBC: 3.62 MIL/uL — ABNORMAL LOW (ref 3.87–5.11)
RDW: 14.6 % (ref 11.5–15.5)
WBC: 7.9 10*3/uL (ref 4.0–10.5)
nRBC: 0 % (ref 0.0–0.2)

## 2021-01-12 LAB — COMPREHENSIVE METABOLIC PANEL
ALT: 22 U/L (ref 0–44)
AST: 17 U/L (ref 15–41)
Albumin: 3 g/dL — ABNORMAL LOW (ref 3.5–5.0)
Alkaline Phosphatase: 61 U/L (ref 38–126)
Anion gap: 10 (ref 5–15)
BUN: 19 mg/dL (ref 8–23)
CO2: 26 mmol/L (ref 22–32)
Calcium: 9 mg/dL (ref 8.9–10.3)
Chloride: 103 mmol/L (ref 98–111)
Creatinine, Ser: 1.07 mg/dL — ABNORMAL HIGH (ref 0.44–1.00)
GFR, Estimated: 51 mL/min — ABNORMAL LOW (ref >60)
Glucose, Bld: 131 mg/dL — ABNORMAL HIGH (ref 70–99)
Potassium: 4.3 mmol/L (ref 3.5–5.1)
Sodium: 139 mmol/L (ref 135–145)
Total Bilirubin: 0.5 mg/dL (ref 0.3–1.2)
Total Protein: 6.7 g/dL (ref 6.5–8.1)

## 2021-01-12 LAB — CK: Total CK: 31 U/L — ABNORMAL LOW (ref 38–234)

## 2021-01-12 LAB — LACTIC ACID, PLASMA
Lactic Acid, Venous: 1.4 mmol/L (ref 0.5–1.9)
Lactic Acid, Venous: 0.7 mmol/L (ref 0.5–1.9)
Lactic Acid, Venous: 0.9 mmol/L (ref 0.5–1.9)

## 2021-01-12 LAB — RESP PANEL BY RT-PCR (FLU A&B, COVID) ARPGX2
Influenza A by PCR: NEGATIVE
Influenza A by PCR: NEGATIVE
Influenza B by PCR: NEGATIVE
Influenza B by PCR: NEGATIVE
SARS Coronavirus 2 by RT PCR: NEGATIVE
SARS Coronavirus 2 by RT PCR: NEGATIVE

## 2021-01-12 LAB — TROPONIN I (HIGH SENSITIVITY)
Troponin I (High Sensitivity): 247 ng/L (ref <18)
Troponin I (High Sensitivity): 249 ng/L (ref <18)

## 2021-01-12 MED ORDER — INSULIN ASPART 100 UNIT/ML IJ SOLN
0.0000 [IU] | Freq: Three times a day (TID) | INTRAMUSCULAR | Status: DC
  Administered 2021-01-13: 2 [IU] via SUBCUTANEOUS
  Administered 2021-01-13: 5 [IU] via SUBCUTANEOUS
  Administered 2021-01-13 – 2021-01-14 (×2): 2 [IU] via SUBCUTANEOUS
  Administered 2021-01-14: 3 [IU] via SUBCUTANEOUS
  Administered 2021-01-15: 2 [IU] via SUBCUTANEOUS
  Administered 2021-01-15: 5 [IU] via SUBCUTANEOUS
  Administered 2021-01-16: 2 [IU] via SUBCUTANEOUS
  Administered 2021-01-16: 3 [IU] via SUBCUTANEOUS
  Administered 2021-01-17: 2 [IU] via SUBCUTANEOUS
  Administered 2021-01-17: 3 [IU] via SUBCUTANEOUS
  Administered 2021-01-18 – 2021-01-19 (×4): 2 [IU] via SUBCUTANEOUS
  Administered 2021-01-19: 3 [IU] via SUBCUTANEOUS
  Filled 2021-01-12 (×16): qty 1

## 2021-01-12 MED ORDER — ENOXAPARIN SODIUM 40 MG/0.4ML IJ SOSY
40.0000 mg | PREFILLED_SYRINGE | INTRAMUSCULAR | Status: DC
  Administered 2021-01-12 – 2021-01-18 (×7): 40 mg via SUBCUTANEOUS
  Filled 2021-01-12 (×7): qty 0.4

## 2021-01-12 MED ORDER — METOPROLOL TARTRATE 50 MG PO TABS
50.0000 mg | ORAL_TABLET | Freq: Two times a day (BID) | ORAL | Status: DC
  Administered 2021-01-12 – 2021-01-19 (×14): 50 mg via ORAL
  Filled 2021-01-12 (×15): qty 1

## 2021-01-12 MED ORDER — METOPROLOL TARTRATE 5 MG/5ML IV SOLN
2.5000 mg | Freq: Four times a day (QID) | INTRAVENOUS | Status: DC | PRN
  Administered 2021-01-12: 2.5 mg via INTRAVENOUS
  Filled 2021-01-12: qty 5

## 2021-01-12 MED ORDER — FUROSEMIDE 10 MG/ML IJ SOLN
40.0000 mg | Freq: Every day | INTRAMUSCULAR | Status: DC
  Administered 2021-01-12 – 2021-01-15 (×4): 40 mg via INTRAVENOUS
  Filled 2021-01-12 (×4): qty 4

## 2021-01-12 MED ORDER — ALBUTEROL SULFATE (2.5 MG/3ML) 0.083% IN NEBU: 2.5000 mg | INHALATION_SOLUTION | RESPIRATORY_TRACT | Status: DC | PRN

## 2021-01-12 MED ORDER — SODIUM CHLORIDE 0.9 % IV SOLN
500.0000 mg | INTRAVENOUS | Status: DC
  Administered 2021-01-13 – 2021-01-14 (×2): 500 mg via INTRAVENOUS
  Filled 2021-01-12 (×3): qty 500

## 2021-01-12 MED ORDER — SODIUM CHLORIDE 0.9 % IV SOLN
500.0000 mg | Freq: Once | INTRAVENOUS | Status: AC
  Administered 2021-01-12: 500 mg via INTRAVENOUS
  Filled 2021-01-12: qty 500

## 2021-01-12 MED ORDER — SODIUM CHLORIDE 0.9% FLUSH
3.0000 mL | Freq: Two times a day (BID) | INTRAVENOUS | Status: DC
  Administered 2021-01-12 – 2021-01-19 (×14): 3 mL via INTRAVENOUS

## 2021-01-12 MED ORDER — ACETAMINOPHEN 325 MG PO TABS
650.0000 mg | ORAL_TABLET | Freq: Once | ORAL | Status: AC
  Administered 2021-01-12: 650 mg via ORAL
  Filled 2021-01-12: qty 2

## 2021-01-12 MED ORDER — SODIUM CHLORIDE 0.9 % IV SOLN
1.0000 g | Freq: Once | INTRAVENOUS | Status: AC
  Administered 2021-01-12: 1 g via INTRAVENOUS
  Filled 2021-01-12: qty 10

## 2021-01-12 MED ORDER — SENNOSIDES-DOCUSATE SODIUM 8.6-50 MG PO TABS: 1.0000 | ORAL_TABLET | Freq: Every evening | ORAL | Status: DC | PRN

## 2021-01-12 MED ORDER — SODIUM CHLORIDE 0.9 % IV SOLN
2.0000 g | INTRAVENOUS | Status: DC
  Administered 2021-01-13 – 2021-01-14 (×2): 2 g via INTRAVENOUS
  Filled 2021-01-12 (×3): qty 20

## 2021-01-12 MED ORDER — ALBUTEROL SULFATE HFA 108 (90 BASE) MCG/ACT IN AERS: 2.0000 | INHALATION_SPRAY | Freq: Four times a day (QID) | RESPIRATORY_TRACT | Status: DC | PRN

## 2021-01-12 MED ORDER — ONDANSETRON HCL 4 MG/2ML IJ SOLN: 4.0000 mg | Freq: Four times a day (QID) | INTRAMUSCULAR | Status: DC | PRN

## 2021-01-12 MED ORDER — ACETAMINOPHEN 650 MG RE SUPP: 650.0000 mg | Freq: Four times a day (QID) | RECTAL | Status: DC | PRN

## 2021-01-12 MED ORDER — ONDANSETRON HCL 4 MG PO TABS: 4.0000 mg | ORAL_TABLET | Freq: Four times a day (QID) | ORAL | Status: DC | PRN

## 2021-01-12 MED ORDER — ACETAMINOPHEN 325 MG PO TABS
650.0000 mg | ORAL_TABLET | Freq: Four times a day (QID) | ORAL | Status: DC | PRN
  Administered 2021-01-13: 650 mg via ORAL
  Filled 2021-01-12: qty 2

## 2021-01-12 MED ORDER — INSULIN ASPART 100 UNIT/ML IJ SOLN: 0.0000 [IU] | Freq: Every day | INTRAMUSCULAR | Status: DC

## 2021-01-12 NOTE — ED Provider Notes (Signed)
86 year old female status post stroke with right hemiparesis was found on the floor, apparently tripped and fell.  She is not complaining of any injury.  On exam, she is awake and alert.  CT of head and cervical spine are unremarkable.  She is noted to be febrile, urinalysis is pending.   Beth Fuel, MD [id number] [phone]

## 2021-01-12 NOTE — ED Provider Notes (Signed)
[facility] Emergency Department Provider Note  ____________________________________________   I have reviewed the triage vital signs and the nursing notes.   HISTORY  Chief Complaint Weakness and Fall   History limited by: Not Limited, some history obtained from daughter at bedside   HPI Beth Franco is a 86 y.o. female who presents to the emergency department today because of concern for increased weakness and inability to ambulate. The patient typically is able to ambulate on her own. However has had two recent falls prior to today.  Was actually seen at [facility] last night after a fall.  Was found to be febrile there but otherwise reassuring work-up.  Daughter states today the patient was not able to get out of bed on her own.  Family has not noticed that the patient has had any fevers at home.  She has a chronic cough but has not had any change in her breathing or cough.  Patient denies any dysuria or change in her urine.  No nausea vomiting or diarrhea appreciated.  Records reviewed. Per medical record review patient has a history of visit to [facility] last night.   Past Medical History:  Diagnosis Date   Cancer ([place])    COPD (chronic obstructive pulmonary disease) ([place])    Coronary artery disease    Diabetes mellitus without complication ([place])    Hypercholesterolemia    Hypertension     There are no problems to display for this patient.   Past Surgical History:  Procedure Laterality Date   ABDOMINAL HYSTERECTOMY     BREAST SURGERY     lumpectomy   CARDIAC SURGERY     EYE SURGERY     cataract    Prior to Admission medications   Medication Sig Start Date End Date Taking? Authorizing Provider  albuterol (VENTOLIN HFA) 108 (90 Base) MCG/ACT inhaler Inhale 2 puffs into the lungs every 6 (six) hours as needed for wheezing or shortness of breath. 02/12/20   Juline Patch, MD  aspirin 325 MG tablet Take 325 mg by mouth daily.     [provider]  Calcium 200 MG TABS Take 1 tablet by mouth in the morning and at bedtime.    [provider]  clopidogrel (PLAVIX) 75 MG tablet Take 1 tablet (75 mg total) by mouth daily. 07/15/20   Juline Patch, MD  ferrous sulfate 325 (65 FE) MG EC tablet Take 325 mg by mouth daily with breakfast.    [provider]  losartan (COZAAR) 100 MG tablet Take 1 tablet (100 mg total) by mouth daily. 07/15/20   Juline Patch, MD  lovastatin (MEVACOR) 40 MG tablet Take 1 tablet (40 mg total) by mouth at bedtime. 07/15/20   Juline Patch, MD  melatonin 1 MG TABS tablet Take 2 mg by mouth at bedtime.    [provider]  metFORMIN (GLUCOPHAGE) 1000 MG tablet TAKE 1 TABLET BY MOUTH TWICE DAILY BEFORE MEAL(S) 11/24/20   Juline Patch, MD  metoprolol tartrate (LOPRESSOR) 50 MG tablet Take 1 tablet (50 mg total) by mouth 2 (two) times daily. 07/15/20   Juline Patch, MD  Multiple Vitamins-Minerals (ONE-A-DAY WOMENS 50+ ADVANTAGE PO) Take 1 tablet by mouth daily.    [provider]  OXYGEN Inhale into the lungs at bedtime.    [provider]    Allergies Patient has no known allergies.  Family History  Problem Relation Age of Onset   Cancer Father  Social History Social History   Tobacco Use   Smoking status: Every Day    Types: Cigarettes   Smokeless tobacco: Never  Vaping Use   Vaping Use: Never used  Substance Use Topics   Alcohol use: Not Currently   Drug use: Never    Review of Systems Constitutional: No fever/chills. Positive for generalized weakness. Eyes: No visual changes. ENT: No sore throat. Cardiovascular: Denies chest pain. Respiratory: Denies shortness of breath. Positive for chronic cough. Gastrointestinal: No abdominal pain.  No nausea, no vomiting.  No diarrhea.   Genitourinary: Negative for dysuria. Musculoskeletal: Negative for back pain. Skin: Negative for rash. Neurological: Chronic right lower  extremity weakness from previous CVA.  ____________________________________________   PHYSICAL EXAM:  VITAL SIGNS: ED Triage Vitals [01/12/21 1335]  Enc Vitals Group     BP (!) 194/79     Pulse Rate 92     Resp 20     Temp (!) 101.8 F (38.8 C)     Temp Source Oral     SpO2 100 %   Constitutional: Alert and oriented.  Eyes: Conjunctivae are normal.  ENT      Head: Normocephalic and atraumatic.      Nose: No congestion/rhinnorhea.      Mouth/Throat: Mucous membranes are moist.      Neck: No stridor. Hematological/Lymphatic/Immunilogical: No cervical lymphadenopathy. Cardiovascular: Normal rate, regular rhythm.  No murmurs, rubs, or gallops.  Respiratory: Normal respiratory effort without tachypnea nor retractions. Breath sounds are clear and equal bilaterally. No wheezes/rales/rhonchi. Gastrointestinal: Soft and non tender. No rebound. No guarding.  Genitourinary: Deferred Musculoskeletal: Normal range of motion in all extremities. No lower extremity edema. Neurologic:  Strength 5/5 in upper extremities. Strength 3/5 in right lower extremity, 5/5 in left lower extremity. Sensation grossly intact.  Skin:  Skin is warm, dry and intact. No rash noted. Psychiatric: Mood and affect are normal. Speech and behavior are normal. Patient exhibits appropriate insight and judgment.  ____________________________________________    LABS (pertinent positives/negatives)  BMP wnl except glu 126, ca 8.6 CBC wbc 7.9, hgb 11.0, plt 366 COVID negative ____________________________________________   EKG  I, Nance Pear, attending physician, personally viewed and interpreted this EKG  EKG Time: 1348 Rate: 92 Rhythm: normal sinus rhythm Axis: normal Intervals: qtc 474 QRS: RBBB ST changes: no st elevation Impression: abnormal ekg  ____________________________________________    RADIOLOGY  CXR Increased interstitial markings throughout both lungs. Right pleural effusion with  adjacent airspace disease  ____________________________________________   PROCEDURES  Procedures  ____________________________________________   INITIAL IMPRESSION / ASSESSMENT AND PLAN / ED COURSE  Pertinent labs & imaging results that were available during my care of the patient were reviewed by me and considered in my medical decision making (see chart for details).   Patient presented to the emergency department today because of concerns for increased weakness.  On exam patient was febrile.  Did have concern for possible infection causing patient's increased weakness.  Chest x-ray today is concerning for possible pneumonia.  Do also have some concerns for increased edema.  Patient was found to be hypoxic on room air.  She is on oxygen however only at night.  Will plan on starting antibiotics for possible pneumonia.  Will plan on admission to hospital service.  Discussed findings and plan with patient and daughter.   ____________________________________________   FINAL CLINICAL IMPRESSION(S) / ED DIAGNOSES  Final diagnoses:  Weakness  Pneumonia due to infectious organism, unspecified laterality, unspecified part of lung  Note: This dictation was prepared with Dragon dictation. Any transcriptional errors that result from this process are unintentional     Nance Pear, MD 01/12/21 1707

## 2021-01-12 NOTE — ED Triage Notes (Signed)
Pt comes into the ED via EMS from home with c/o increased generalized weakness for the past couple of days, fell yesterday and was seen at [facility] and discharged , slipped out of bed today and is here seeking SNF placement, pt normally on O2 at night,EMS reports pt desat into the 80's and place the pt on 3L [place] A/ox4 on arrival  HR78 RR20 93% on 3L [place] CBG140 189/88

## 2021-01-12 NOTE — H&P (Signed)
History and Physical    Huberta Luskin [id number] DOB: January 10, 1935 DOA: 01/12/2021  PCP: Juline Patch, MD   Patient coming from: Home  I have personally briefly reviewed patient's old medical records in [place]  Chief Complaint: worsening weakness  HPI: Beth Franco is a 86 y.o. female with medical history significant of  HTN, DM type II, COPD, chronic respiratory failure with hypoxia requiring 2 to 3 L oxygen via nasal cannula at night only, CAD S/P stent, unspecified stroke with residual right lower extremity weakness and dyslipidiemia presented with worsening generalized weakness, inability to ambulate and falls.  Patient normally ambulates on her own with a walker.  She had 2 falls over the last 2 days.  She was seen at [facility] last night after a fall, was found to be febrile but was discharged home after reassuring work-up.  Patient was unable to get out of her bed on her own today.  Patient denies any fevers at home but complains of chronic cough.  She denies any shortness of breath, chest pain, nausea, vomiting, diarrhea, dysuria.  Denies any urgency or frequency.  No loss of consciousness, seizures.  Patient continues to smoke.  ED Course: EMS reported that patient was desaturating into the 80s on room air and she was placed on 3 L nasal cannula oxygen.  In the ED, blood work was essentially unremarkable with normal lactic acid.  CT of the head without contrast showed no acute intracranial abnormality.  CT of the cervical spine without contrast showed no acute fracture.  Chest x-ray showed increased interstitial markings reflecting pulmonary edema, viral infection or combination along with right pleural effusion with adjacent airspace disease which could reflect pneumonia.  She was started on Rocephin and Zithromax.  COVID-19 and influenza testing were negative.  Blood cultures were sent. Hospitalist service was called to evaluate the patient.  Review of Systems: As  per HPI otherwise all other systems were reviewed and are negative.   Past Medical History:  Diagnosis Date   Cancer ([place])    COPD (chronic obstructive pulmonary disease) ([place])    Coronary artery disease    Diabetes mellitus without complication ([place])    Hypercholesterolemia    Hypertension     Past Surgical History:  Procedure Laterality Date   ABDOMINAL HYSTERECTOMY     BREAST SURGERY     lumpectomy   CARDIAC SURGERY     EYE SURGERY     cataract   Social history  reports that she has been smoking cigarettes. She has never used smokeless tobacco. She reports that she does not currently use alcohol. She reports that she does not use drugs. -Currently lives at home with her daughter  No Known Allergies  Family History  Problem Relation Age of Onset   Cancer Father     Prior to Admission medications   Medication Sig Start Date End Date Taking? Authorizing Provider  albuterol (VENTOLIN HFA) 108 (90 Base) MCG/ACT inhaler Inhale 2 puffs into the lungs every 6 (six) hours as needed for wheezing or shortness of breath. 02/12/20   Juline Patch, MD  aspirin 325 MG tablet Take 325 mg by mouth daily.    [provider]  Calcium 200 MG TABS Take 1 tablet by mouth in the morning and at bedtime.    [provider]  clopidogrel (PLAVIX) 75 MG tablet Take 1 tablet (75 mg total) by mouth daily. 07/15/20   Juline Patch, MD  ferrous sulfate 325 (  65 FE) MG EC tablet Take 325 mg by mouth daily with breakfast.    [provider]  losartan (COZAAR) 100 MG tablet Take 1 tablet (100 mg total) by mouth daily. 07/15/20   Juline Patch, MD  lovastatin (MEVACOR) 40 MG tablet Take 1 tablet (40 mg total) by mouth at bedtime. 07/15/20   Juline Patch, MD  melatonin 1 MG TABS tablet Take 2 mg by mouth at bedtime.    [provider]  metFORMIN (GLUCOPHAGE) 1000 MG tablet TAKE 1 TABLET BY MOUTH TWICE DAILY BEFORE MEAL(S) 11/24/20   Juline Patch, MD  metoprolol  tartrate (LOPRESSOR) 50 MG tablet Take 1 tablet (50 mg total) by mouth 2 (two) times daily. 07/15/20   Juline Patch, MD  Multiple Vitamins-Minerals (ONE-A-DAY WOMENS 50+ ADVANTAGE PO) Take 1 tablet by mouth daily.    [provider]  OXYGEN Inhale into the lungs at bedtime.    [provider]    Physical Exam: Vitals:   01/12/21 1335 01/12/21 1530 01/12/21 1535 01/12/21 1648  BP: (!) 194/79 (!) 151/59    Pulse: 92 78    Resp: 20 (!) 23    Temp: (!) 101.8 F (38.8 C)   99.1 F (37.3 C)  TempSrc: Oral     SpO2: 100% 100% 97%     Constitutional: NAD, calm, comfortable.  Elderly female lying in bed.  Currently on 2 L oxygen by nasal cannula Vitals:   01/12/21 1335 01/12/21 1530 01/12/21 1535 01/12/21 1648  BP: (!) 194/79 (!) 151/59    Pulse: 92 78    Resp: 20 (!) 23    Temp: (!) 101.8 F (38.8 C)   99.1 F (37.3 C)  TempSrc: Oral     SpO2: 100% 100% 97%    Eyes: PERRL, lids and conjunctivae normal ENMT: Mucous membranes are moist. Posterior pharynx clear of any exudate or lesions. Neck: normal, supple, no masses, no thyromegaly Respiratory: bilateral decreased breath sounds at bases, no wheezing; some scattered crackles heard.  Intermittently tachypneic.  No accessory muscle use.  Cardiovascular: S1 S2 positive, rate controlled. No extremity edema. 2+ pedal pulses.  Abdomen: no tenderness, no masses palpated. No hepatosplenomegaly. Bowel sounds positive.  Musculoskeletal: no clubbing / cyanosis. No joint deformity upper and lower extremities.  Skin: no rashes, lesions, ulcers. No induration Neurologic: CN 2-12 grossly intact. Moving extremities. No focal neurologic deficits.  Psychiatric: Normal judgment and insight. Alert and oriented x 3. Normal mood.    Labs on Admission: I have personally reviewed following labs and imaging studies  CBC: Recent Labs  Lab 01/12/21 0002 01/12/21 1353  WBC 8.5 7.9  NEUTROABS 6.8  --   HGB 10.8* 11.0*  HCT 35.4*  34.1*  MCV 95.9 94.2  PLT 374 [id number]   Basic Metabolic Panel: Recent Labs  Lab 01/12/21 0002 01/12/21 1353  NA 139 138  K 4.3 3.9  CL 103 101  CO2 26 26  GLUCOSE 131* 126*  BUN 19 16  CREATININE 1.07* 0.92  CALCIUM 9.0 8.6*   GFR: Estimated Creatinine Clearance: 46 mL/min (by C-G formula based on SCr of 0.92 mg/dL). Liver Function Tests: Recent Labs  Lab 01/12/21 0002  AST 17  ALT 22  ALKPHOS 61  BILITOT 0.5  PROT 6.7  ALBUMIN 3.0*   No results for input(s): LIPASE, AMYLASE in the last 168 hours. No results for input(s): AMMONIA in the last 168 hours. Coagulation Profile: Recent Labs  Lab 01/12/21 0002  INR  1.0   Cardiac Enzymes: Recent Labs  Lab 01/12/21 0002  CKTOTAL 31*   BNP (last 3 results) No results for input(s): PROBNP in the last 8760 hours. HbA1C: No results for input(s): HGBA1C in the last 72 hours. CBG: Recent Labs  Lab 01/12/21 0001  GLUCAP 123*   Lipid Profile: No results for input(s): CHOL, HDL, LDLCALC, TRIG, CHOLHDL, LDLDIRECT in the last 72 hours. Thyroid Function Tests: No results for input(s): TSH, T4TOTAL, FREET4, T3FREE, THYROIDAB in the last 72 hours. Anemia Panel: No results for input(s): VITAMINB12, FOLATE, FERRITIN, TIBC, IRON, RETICCTPCT in the last 72 hours. Urine analysis:    Component Value Date/Time   COLORURINE YELLOW 01/11/2021 [place] 01/11/2021 2341   LABSPEC 1.015 01/11/2021 2341   PHURINE 6.0 01/11/2021 2341   GLUCOSEU NEGATIVE 01/11/2021 2341   HGBUR NEGATIVE 01/11/2021 2341   BILIRUBINUR NEGATIVE 01/11/2021 2341   KETONESUR 15 (A) 01/11/2021 2341   PROTEINUR NEGATIVE 01/11/2021 2341   NITRITE NEGATIVE 01/11/2021 2341   LEUKOCYTESUR TRACE (A) 01/11/2021 2341    Radiological Exams on Admission: CT HEAD WO CONTRAST (5MM)  Result Date: 01/12/2021 CLINICAL DATA:  Found down, confusion EXAM: CT HEAD WITHOUT CONTRAST CT CERVICAL SPINE WITHOUT CONTRAST TECHNIQUE: Multidetector CT imaging of  the head and cervical spine was performed following the standard protocol without intravenous contrast. Multiplanar CT image reconstructions of the cervical spine were also generated. COMPARISON:  None. FINDINGS: CT HEAD FINDINGS Brain: No evidence of acute infarction, hemorrhage, hydrocephalus, extra-axial collection or mass lesion/mass effect. Subcortical white matter and periventricular small vessel ischemic changes. Vascular: Intracranial atherosclerosis. Skull: Normal. Negative for fracture or focal lesion. Sinuses/Orbits: The visualized paranasal sinuses are essentially clear. The mastoid air cells are unopacified. Other: None. CT CERVICAL SPINE FINDINGS Alignment: Normal cervical lordosis. Skull base and vertebrae: No acute fracture. No primary bone lesion or focal pathologic process. Soft tissues and spinal canal: No prevertebral fluid or swelling. No visible canal hematoma. Disc levels: Mild degenerative changes of the mid cervical spine. Spinal canal is patent. Upper chest: Visualized lung apices are notable for layering small pleural effusions. Other: None. IMPRESSION: No evidence of acute intracranial abnormality. Small vessel ischemic changes. No evidence of traumatic injury to the cervical spine. Mild degenerative changes. Small layering pleural effusions. Electronically Signed   By: Julian Hy M.D.   On: 01/12/2021 01:00   CT Cervical Spine Wo Contrast  Result Date: 01/12/2021 CLINICAL DATA:  Found down, confusion EXAM: CT HEAD WITHOUT CONTRAST CT CERVICAL SPINE WITHOUT CONTRAST TECHNIQUE: Multidetector CT imaging of the head and cervical spine was performed following the standard protocol without intravenous contrast. Multiplanar CT image reconstructions of the cervical spine were also generated. COMPARISON:  None. FINDINGS: CT HEAD FINDINGS Brain: No evidence of acute infarction, hemorrhage, hydrocephalus, extra-axial collection or mass lesion/mass effect. Subcortical white matter and  periventricular small vessel ischemic changes. Vascular: Intracranial atherosclerosis. Skull: Normal. Negative for fracture or focal lesion. Sinuses/Orbits: The visualized paranasal sinuses are essentially clear. The mastoid air cells are unopacified. Other: None. CT CERVICAL SPINE FINDINGS Alignment: Normal cervical lordosis. Skull base and vertebrae: No acute fracture. No primary bone lesion or focal pathologic process. Soft tissues and spinal canal: No prevertebral fluid or swelling. No visible canal hematoma. Disc levels: Mild degenerative changes of the mid cervical spine. Spinal canal is patent. Upper chest: Visualized lung apices are notable for layering small pleural effusions. Other: None. IMPRESSION: No evidence of acute intracranial abnormality. Small vessel ischemic changes. No evidence of  traumatic injury to the cervical spine. Mild degenerative changes. Small layering pleural effusions. Electronically Signed   By: Julian Hy M.D.   On: 01/12/2021 01:00   DG Chest Portable 1 View  Result Date: 01/12/2021 CLINICAL DATA:  Fever EXAM: PORTABLE CHEST 1 VIEW COMPARISON:  Chest radiograph 01/13/2011 FINDINGS: The heart is enlarged, unchanged. The mediastinal contours are stable. There are patchy interstitial markings throughout both lungs. There is increased density in the right base with a new small right pleural effusion. There is no significant left effusion. There is no pneumothorax. There is no acute osseous abnormality. IMPRESSION: 1. Increased interstitial markings throughout both lungs may reflect pulmonary edema, viral infection, or combination. 2. Increased conspicuity of a small right pleural effusion with adjacent airspace disease which could reflect pneumonia in the correct clinical setting. 3. Cardiomegaly. Electronically Signed   By: Valetta Mole M.D.   On: 01/12/2021 16:05   DG Chest Port 1 View  Result Date: 01/12/2021 CLINICAL DATA:  Found down, confusion EXAM: PORTABLE CHEST  1 VIEW COMPARISON:  None. FINDINGS: Increased interstitial markings. No definite pleural effusions. No pneumothorax. Cardiomegaly.  Thoracic aortic atherosclerosis. IMPRESSION: Cardiomegaly with suspected mild interstitial edema. No definite pleural effusions. Electronically Signed   By: Julian Hy M.D.   On: 01/12/2021 00:49     Assessment/Plan  Possible community-acquired pneumonia -Presented with generalized weakness and fever with cough.  Chest x-ray showed increased interstitial markings with possible right-sided pneumonia -Continue Rocephin and Zithromax.  Follow cultures.  Urine Legionella and streptococcal antigen.  Possible UTI: Present on admission -Continue Rocephin.  Follow cultures  COPD Acute on chronic respiratory failure with hypoxia Chronic tobacco use -Patient continues to smoke.  Continue inhalers and nebs as needed.  We will add Dulera.  Counseled regarding tobacco cessation. -Patient normally uses 2 to 3 L oxygen at night only but currently requiring 2 L oxygen during the day as well. -Incentive spirometry.  Diabetes mellitus type 2 -CBGs with SSI.  Hold oral medications  Hypertension -Monitor blood pressure.  Resume home regimen.  History of unspecified CVA with residual right lower extremity weakness Worsening generalized weakness -Patient has generalized weakness but no evidence of new CVA.  Patient states that she normally walks with a walker because of residual right lower extremity weakness. -PT eval.  Might need SNF placement -Fall precautions  History of CAD and stent -Currently stable.  No chest pain.  Continue home regimen.  Outpatient follow-up with cardiology  Hyperlipidemia -Continue statin  Possible anemia of chronic disease -questionable cause. hemoglobin stable.  Monitor  DVT prophylaxis: Lovenox Code Status: DNR Family Communication: None at bedside Disposition Plan: Patient feels that she might need SNF placement because of  worsening weakness Consults called: None Admission status: Observation/MedSurg  Severity of Illness: The appropriate patient status for this patient is OBSERVATION. Observation status is judged to be reasonable and necessary in order to provide the required intensity of service to ensure the patient's safety. The patient's presenting symptoms, physical exam findings, and initial radiographic and laboratory data in the context of their medical condition is felt to place them at decreased risk for further clinical deterioration. Furthermore, it is anticipated that the patient will be medically stable for discharge from the hospital within 2 midnights of admission. The following factors support the patient status of observation.   " The patient's presenting symptoms include generalized weakness/fever/cough. " The physical exam findings include increased temperature/scattered crackles. " The initial radiographic and laboratory data are possible right-sided pneumonia.  Aline August MD [facility]  01/12/2021, 4:50 PM

## 2021-01-12 NOTE — Telephone Encounter (Signed)
Pt fell last night and again the am. Pt sustained bruising and confusion last night. Pt no longer confused per daughter.Today pt required assistance to sit up in bed and when she got up she fell. Pt is on the floor and daughter [name] cannot get her up.No bleeding or lacerations seen. Advised daughter to call 911 now. Pt will be going to [facility].       Reason for Disposition  [1] SEVERE weakness (i.e., unable to walk or barely able to walk, requires support) AND [2] new-onset or worsening  Answer Assessment - Initial Assessment Questions 1. MECHANISM: "How did the fall happen?"     When she tried to stand up she was not able to bear weight 2. DOMESTIC VIOLENCE AND ELDER ABUSE SCREENING: "Did you fall because someone pushed you or tried to hurt you?" If Yes, ask: "Are you safe now?"     no 3. ONSET: "When did the fall happen?" (e.g., minutes, hours, or days ago)     This am - had fallen last Wednesday and last night 4. LOCATION: "What part of the body hit the ground?" (e.g., back, buttocks, head, hips, knees, hands, head, stomach)     head 5. INJURY: "Did you hurt (injure) yourself when you fell?" If Yes, ask: "What did you injure? Tell me more about this?" (e.g., body area; type of injury; pain severity)"     Bruising to right side  face   6. PAIN: "Is there any pain?" If Yes, ask: "How bad is the pain?" (e.g., Scale 1-10; or mild,  moderate, severe)   - NONE (0): No pain   - MILD (1-3): Doesn't interfere with normal activities    - MODERATE (4-7): Interferes with normal activities or awakens from sleep    - SEVERE (8-10): Excruciating pain, unable to do any normal activities      no 7. SIZE: For cuts, bruises, or swelling, ask: "How large is it?" (e.g., inches or centimeters)      none 8. PREGNANCY: "Is there any chance you are pregnant?" "When was your last menstrual period?"     N/a 9. OTHER SYMPTOMS: "Do you have any other symptoms?" (e.g., dizziness, fever, weakness; new onset or  worsening).      Weakness worsening 10. CAUSE: "What do you think caused the fall (or falling)?" (e.g., tripped, dizzy spell)       Unable to bear weight.  Protocols used: Falls and [facility]

## 2021-01-12 NOTE — Discharge Instructions (Signed)
Your work-up in the emergency department today was largely reassuring.  We recommend close follow-up with your primary care doctor.  Use a walker when you are mobile to help with balance.  Take Tylenol as needed for any residual pain or soreness.  Return for new or concerning symptoms.

## 2021-01-13 ENCOUNTER — Observation Stay (HOSPITAL_COMMUNITY)
Admit: 2021-01-13 | Discharge: 2021-01-13 | Disposition: A | Discharge status: Home / Self Care | Payer: Medicare PPO | Attending: Internal Medicine | Admitting: Internal Medicine

## 2021-01-13 DIAGNOSIS — I248 Other forms of acute ischemic heart disease: Secondary | ICD-10-CM | POA: Diagnosis present

## 2021-01-13 DIAGNOSIS — E1169 Type 2 diabetes mellitus with other specified complication: Secondary | ICD-10-CM | POA: Diagnosis not present

## 2021-01-13 DIAGNOSIS — R0609 Other forms of dyspnea: Secondary | ICD-10-CM

## 2021-01-13 DIAGNOSIS — J440 Chronic obstructive pulmonary disease with acute lower respiratory infection: Secondary | ICD-10-CM | POA: Diagnosis present

## 2021-01-13 DIAGNOSIS — N390 Urinary tract infection, site not specified: Secondary | ICD-10-CM | POA: Diagnosis present

## 2021-01-13 DIAGNOSIS — J189 Pneumonia, unspecified organism: Secondary | ICD-10-CM | POA: Diagnosis not present

## 2021-01-13 DIAGNOSIS — I5033 Acute on chronic diastolic (congestive) heart failure: Secondary | ICD-10-CM | POA: Diagnosis present

## 2021-01-13 DIAGNOSIS — I69351 Hemiplegia and hemiparesis following cerebral infarction affecting right dominant side: Secondary | ICD-10-CM | POA: Diagnosis not present

## 2021-01-13 DIAGNOSIS — Z90710 Acquired absence of both cervix and uterus: Secondary | ICD-10-CM | POA: Diagnosis not present

## 2021-01-13 DIAGNOSIS — F0390 Unspecified dementia without behavioral disturbance: Secondary | ICD-10-CM | POA: Diagnosis present

## 2021-01-13 DIAGNOSIS — I110 Hypertensive heart disease with heart failure: Secondary | ICD-10-CM | POA: Diagnosis present

## 2021-01-13 DIAGNOSIS — I350 Nonrheumatic aortic (valve) stenosis: Secondary | ICD-10-CM | POA: Diagnosis present

## 2021-01-13 DIAGNOSIS — Z66 Do not resuscitate: Secondary | ICD-10-CM | POA: Diagnosis present

## 2021-01-13 DIAGNOSIS — Z955 Presence of coronary angioplasty implant and graft: Secondary | ICD-10-CM | POA: Diagnosis not present

## 2021-01-13 DIAGNOSIS — R531 Weakness: Secondary | ICD-10-CM | POA: Diagnosis not present

## 2021-01-13 DIAGNOSIS — E7800 Pure hypercholesterolemia, unspecified: Secondary | ICD-10-CM | POA: Diagnosis present

## 2021-01-13 DIAGNOSIS — Z7902 Long term (current) use of antithrombotics/antiplatelets: Secondary | ICD-10-CM | POA: Diagnosis not present

## 2021-01-13 DIAGNOSIS — E538 Deficiency of other specified B group vitamins: Secondary | ICD-10-CM | POA: Diagnosis not present

## 2021-01-13 DIAGNOSIS — Z20822 Contact with and (suspected) exposure to covid-19: Secondary | ICD-10-CM | POA: Diagnosis present

## 2021-01-13 DIAGNOSIS — F17210 Nicotine dependence, cigarettes, uncomplicated: Secondary | ICD-10-CM | POA: Diagnosis present

## 2021-01-13 DIAGNOSIS — I10 Essential (primary) hypertension: Secondary | ICD-10-CM | POA: Diagnosis not present

## 2021-01-13 DIAGNOSIS — I2510 Atherosclerotic heart disease of native coronary artery without angina pectoris: Secondary | ICD-10-CM | POA: Diagnosis present

## 2021-01-13 DIAGNOSIS — Z7982 Long term (current) use of aspirin: Secondary | ICD-10-CM | POA: Diagnosis not present

## 2021-01-13 DIAGNOSIS — E876 Hypokalemia: Secondary | ICD-10-CM | POA: Diagnosis present

## 2021-01-13 DIAGNOSIS — D519 Vitamin B12 deficiency anemia, unspecified: Secondary | ICD-10-CM | POA: Diagnosis present

## 2021-01-13 LAB — GLUCOSE, CAPILLARY
Glucose-Capillary: 132 mg/dL — ABNORMAL HIGH (ref 70–99)
Glucose-Capillary: 139 mg/dL — ABNORMAL HIGH (ref 70–99)
Glucose-Capillary: 209 mg/dL — ABNORMAL HIGH (ref 70–99)
Glucose-Capillary: 116 mg/dL — ABNORMAL HIGH (ref 70–99)

## 2021-01-13 LAB — COMPREHENSIVE METABOLIC PANEL
ALT: 19 U/L (ref 0–44)
AST: 16 U/L (ref 15–41)
Albumin: 3 g/dL — ABNORMAL LOW (ref 3.5–5.0)
Alkaline Phosphatase: 55 U/L (ref 38–126)
Anion gap: 10 (ref 5–15)
BUN: 17 mg/dL (ref 8–23)
CO2: 28 mmol/L (ref 22–32)
Calcium: 8.1 mg/dL — ABNORMAL LOW (ref 8.9–10.3)
Chloride: 97 mmol/L — ABNORMAL LOW (ref 98–111)
Creatinine, Ser: 0.85 mg/dL (ref 0.44–1.00)
GFR, Estimated: >60 mL/min (ref >60)
Glucose, Bld: 122 mg/dL — ABNORMAL HIGH (ref 70–99)
Potassium: 3.6 mmol/L (ref 3.5–5.1)
Sodium: 135 mmol/L (ref 135–145)
Total Bilirubin: 1 mg/dL (ref 0.3–1.2)
Total Protein: 6.8 g/dL (ref 6.5–8.1)

## 2021-01-13 LAB — ECHOCARDIOGRAM COMPLETE
AR max vel: 1.23 cm2
AV Area VTI: 1.11 cm2
AV Area mean vel: 1.12 cm2
AV Mean grad: 14 mmHg
AV Peak grad: 22.7 mmHg
Ao pk vel: 2.38 m/s
Area-P 1/2: 3.79 cm2
Calc EF: 34.4 %
Height: 65 in
MV VTI: 2.45 cm2
S' Lateral: 4.6 cm
Single Plane A2C EF: 32.7 %
Single Plane A4C EF: 33.2 %
Weight: 2384.5 oz

## 2021-01-13 LAB — URINE CULTURE: Culture: NO GROWTH

## 2021-01-13 LAB — CBC
HCT: 30.9 % — ABNORMAL LOW (ref 36.0–46.0)
Hemoglobin: 10.2 g/dL — ABNORMAL LOW (ref 12.0–15.0)
MCH: 30.3 pg (ref 26.0–34.0)
MCHC: 33 g/dL (ref 30.0–36.0)
MCV: 91.7 fL (ref 80.0–100.0)
Platelets: 336 10*3/uL (ref 150–400)
RBC: 3.37 MIL/uL — ABNORMAL LOW (ref 3.87–5.11)
RDW: 14.4 % (ref 11.5–15.5)
WBC: 9.9 10*3/uL (ref 4.0–10.5)
nRBC: 0 % (ref 0.0–0.2)

## 2021-01-13 LAB — STREP PNEUMONIAE URINARY ANTIGEN: Strep Pneumo Urinary Antigen: NEGATIVE

## 2021-01-13 LAB — PROCALCITONIN: Procalcitonin: <0.1 ng/mL

## 2021-01-13 LAB — VITAMIN B12: Vitamin B-12: 154 pg/mL — ABNORMAL LOW (ref 180–914)

## 2021-01-13 MED ORDER — MELATONIN 5 MG PO TABS
2.5000 mg | ORAL_TABLET | Freq: Every day | ORAL | Status: DC
  Administered 2021-01-13 – 2021-01-18 (×6): 2.5 mg via ORAL
  Filled 2021-01-13 (×6): qty 1

## 2021-01-13 MED ORDER — FERROUS SULFATE 325 (65 FE) MG PO TABS
325.0000 mg | ORAL_TABLET | Freq: Every day | ORAL | Status: DC
  Administered 2021-01-14 – 2021-01-19 (×6): 325 mg via ORAL
  Filled 2021-01-13 (×6): qty 1

## 2021-01-13 MED ORDER — PERFLUTREN LIPID MICROSPHERE
1.0000 mL | INTRAVENOUS | Status: AC | PRN
  Administered 2021-01-13: 2 mL via INTRAVENOUS
  Filled 2021-01-13: qty 10

## 2021-01-13 MED ORDER — CLOPIDOGREL BISULFATE 75 MG PO TABS
75.0000 mg | ORAL_TABLET | Freq: Every day | ORAL | Status: DC
  Administered 2021-01-13 – 2021-01-19 (×7): 75 mg via ORAL
  Filled 2021-01-13 (×7): qty 1

## 2021-01-13 MED ORDER — GLUCERNA SHAKE PO LIQD
237.0000 mL | Freq: Three times a day (TID) | ORAL | Status: DC
  Administered 2021-01-13 – 2021-01-19 (×19): 237 mL via ORAL

## 2021-01-13 MED ORDER — LOSARTAN POTASSIUM 50 MG PO TABS
100.0000 mg | ORAL_TABLET | Freq: Every day | ORAL | Status: DC
  Administered 2021-01-13 – 2021-01-19 (×7): 100 mg via ORAL
  Filled 2021-01-13 (×7): qty 2

## 2021-01-13 MED ORDER — PRAVASTATIN SODIUM 40 MG PO TABS
40.0000 mg | ORAL_TABLET | Freq: Every day | ORAL | Status: DC
  Administered 2021-01-13 – 2021-01-18 (×6): 40 mg via ORAL
  Filled 2021-01-13 (×6): qty 1

## 2021-01-13 NOTE — NC FL2 (Signed)
[place] LEVEL OF CARE SCREENING TOOL     IDENTIFICATION  Patient Name: Beth Franco Birthdate: [birth year] Sex: female Admission Date (Current Location): 01/12/2021  [facility] and [place] Number:  Engineering geologist and Address:  [facility], [address] [place], [place] [address]      Provider Number: [id number]  Attending Physician Name and Address:  Lorella Nimrod, MD  Relative Name and Phone Number:  [name] (dauhter) [phone]    Current Level of Care: Hospital Recommended Level of Care: [place] Prior Approval Number:    Date Approved/Denied:   PASRR Number: [id number]  Discharge Plan: SNF    Current Diagnoses: Patient Active Problem List   Diagnosis Date Noted   Pneumonia 01/12/2021   Acute lower UTI 01/12/2021   HTN (hypertension) 01/12/2021   Type 2 diabetes mellitus with hyperlipidemia ([place]) 01/12/2021    Orientation RESPIRATION BLADDER Height & Weight     Self  O2 (3L nasal cannula) Incontinent, External catheter Weight: 149 lb 0.5 oz (67.6 kg) Height:  '5\' 5"'$  (165.1 cm)  BEHAVIORAL SYMPTOMS/MOOD NEUROLOGICAL BOWEL NUTRITION STATUS      Incontinent Diet (see discharge summary)  AMBULATORY STATUS COMMUNICATION OF NEEDS Skin   Extensive Assist Verbally Normal                       Personal Care Assistance Level of Assistance  Feeding, Total care, Bathing, Dressing Bathing Assistance: Maximum assistance Feeding assistance: Limited assistance Dressing Assistance: Maximum assistance Total Care Assistance: Maximum assistance   Functional Limitations Info  Sight, Hearing, Speech Sight Info: Adequate Hearing Info: Adequate Speech Info: Adequate    SPECIAL CARE FACTORS FREQUENCY  PT (By licensed PT), OT (By licensed OT)     PT Frequency: min 4x weekly OT Frequency: min 4x weekly            Contractures Contractures Info: Not present    Additional Factors Info  Code Status,  Allergies Code Status Info: DNR Allergies Info: No Known Allergies           Current Medications (01/13/2021):  This is the current hospital active medication list Current Facility-Administered Medications  Medication Dose Route Frequency Provider Last Rate Last Admin   acetaminophen (TYLENOL) tablet 650 mg  650 mg Oral Q6H PRN Aline August, MD   650 mg at 01/13/21 1255   Or   acetaminophen (TYLENOL) suppository 650 mg  650 mg Rectal Q6H PRN Alekh, Kshitiz, MD       albuterol (PROVENTIL) (2.5 MG/3ML) 0.083% nebulizer solution 2.5 mg  2.5 mg Nebulization Q2H PRN Alekh, Kshitiz, MD       azithromycin (ZITHROMAX) 500 mg in sodium chloride 0.9 % 250 mL IVPB  500 mg Intravenous Q24H Alekh, Kshitiz, MD       cefTRIAXone (ROCEPHIN) 2 g in sodium chloride 0.9 % 100 mL IVPB  2 g Intravenous Q24H Alekh, Kshitiz, MD       enoxaparin (LOVENOX) injection 40 mg  40 mg Subcutaneous Q24H Alekh, Kshitiz, MD   40 mg at 01/12/21 2145   feeding supplement (GLUCERNA SHAKE) (GLUCERNA SHAKE) liquid 237 mL  237 mL Oral TID BM Amin, Soundra Pilon, MD       furosemide (LASIX) injection 40 mg  40 mg Intravenous Daily Alekh, Kshitiz, MD   40 mg at 01/13/21 1003   insulin aspart (novoLOG) injection 0-15 Units  0-15 Units Subcutaneous TID WC Aline August, MD   2 Units at 01/13/21 1255  insulin aspart (novoLOG) injection 0-5 Units  0-5 Units Subcutaneous QHS Alekh, Kshitiz, MD       metoprolol tartrate (LOPRESSOR) injection 2.5 mg  2.5 mg Intravenous Q6H PRN Aline August, MD   2.5 mg at 01/12/21 1936   metoprolol tartrate (LOPRESSOR) tablet 50 mg  50 mg Oral BID Aline August, MD   50 mg at 01/13/21 1003   ondansetron (ZOFRAN) tablet 4 mg  4 mg Oral Q6H PRN Aline August, MD       Or   ondansetron (ZOFRAN) injection 4 mg  4 mg Intravenous Q6H PRN Alekh, Kshitiz, MD       senna-docusate (Senokot-S) tablet 1 tablet  1 tablet Oral QHS PRN Alekh, Kshitiz, MD       sodium chloride flush (NS) 0.9 % injection 3 mL  3 mL  Intravenous Q12H Starla Link, Kshitiz, MD   3 mL at 01/13/21 1003     Discharge Medications: Please see discharge summary for a list of discharge medications.  Relevant Imaging Results:  Relevant Lab Results:   Additional Information SSN: [id number]  Alberteen Sam, LCSW

## 2021-01-13 NOTE — Progress Notes (Signed)
CSW lvm for patient's daughter to discuss SNF rec as patient not fully oriented.   CSW will continue to attempt to reach daughter to discuss recs.   [place], [place]

## 2021-01-13 NOTE — Evaluation (Addendum)
Clinical/Bedside Swallow Evaluation Beth Franco Details  Name: Beth Franco MRN: [id number] Date of Birth: 1934/05/13  Today's Date: 01/13/2021 Time: SLP Start Time (ACUTE ONLY): 106 SLP Stop Time (ACUTE ONLY): 1455 SLP Time Calculation (min) (ACUTE ONLY): 50 min  Past Medical History:  Past Medical History:  Diagnosis Date   Cancer ([place])    COPD (chronic obstructive pulmonary disease) ([place])    Coronary artery disease    Diabetes mellitus without complication ([place])    Hypercholesterolemia    Hypertension    Past Surgical History:  Past Surgical History:  Procedure Laterality Date   ABDOMINAL HYSTERECTOMY     BREAST SURGERY     lumpectomy   CARDIAC SURGERY     EYE SURGERY     cataract   HPI:  Beth Franco is a 86 y.o. female with medical history significant of  HTN, DM type II, COPD, chronic respiratory failure with hypoxia requiring 2 to 3 L oxygen via nasal cannula at night only, CAD S/P stent, unspecified stroke with residual right lower extremity weakness and dyslipidiemia presented with worsening generalized weakness, inability to ambulate and falls.  Beth Franco normally ambulates on her own with a walker.  She had 2 falls over the last 2 days.  She was seen at [facility] last night after a fall, was found to be febrile but was discharged home after reassuring work-up.  Beth Franco was unable to get out of her bed on her own today.  Beth Franco denies any fevers at home but complains of chronic cough.  She denies any shortness of breath, chest pain, nausea, vomiting, diarrhea, dysuria.  Denies any urgency or frequency.  No loss of consciousness, seizures.  Beth Franco continues to smoke.   CXRs at admit: Cardiomegaly, with suspected mild interstitial edema. No definite  pleural effusions. Increased interstitial markings throughout both lungs may reflect  pulmonary edema, viral infection, or combination.  ADDENDUM:  MD stated Beth Franco has Cognitive decline as a Baseline dx. (01/13/2021)   Assessment / Plan  / Recommendation Clinical Impression  Beth Franco appears to present w/ grossly adequate oropharyngeal phase swallow function w/ No oropharyngeal phase dysphagia noted, No neuromuscular deficits noted. Beth Franco consumed po trials w/ No overt, clinical s/s of aspiration during the po trials. Beth Franco appears at reduced risk for aspiration following general aspiration precautions and using a modified foods/solids diet consistency d/t Missing Dentition baseline. Of Note, Beth Franco appears to present w/ Mild Cognitive decline in her verbalizations and engagement -- paucity of speech and required verbal cues to follow through w/ tasks. Also noted she had Breakfast food items spilled on her and in the bed but she did not call out or make mention of this to this Clinician in order to get cleaned up. Unsure of Beth Franco's Baseline Cognitive status, but she appears to do best when given Encouragement w/ po's, full Tray Setup, and Support along w/ foods of Choice at meals.    During po trials, Beth Franco consumed all consistencies w/ no overt coughing, decline in vocal quality, or change in respiratory presentation during/post trials. Oral phase appeared grossly [facility] w/ timely bolus management and control of bolus propulsion for A-P transfer for swallowing. Min increased Time needed w/ solid foods for full mastication d/t Missing Dentition. Also noted a min Munching pattern w/ increased textured trials of foods. Encouraged lingual sweeping to fully clear mouth b/t bites; also alternated sips of liquids w/ bites to fully clear mouth. Oral clearing achieved w/ all trial consistencies w/ Time and strategies. OM Exam appeared  WFL w/ no overt unilateral weakness noted. Speech Clear. Beth Franco fed self w/ setup support, cues.     Recommend a Mech Soft diet consistency w/ well-Cut meats, moistened foods; Thin liquids. Recommend general aspiration precautions, support at meals for ensuring oral clearing and follow through w/ self-feeding at meals. Pills in Puree for safer,  easier swallowing IF Beth Franco has ANY difficulty swallowing Pills w/ liquids. Education given on Pills in Puree; food consistencies and easy to eat options; general aspiration precautions to both Beth Franco and NSG. NSG to reconsult if any new needs arise. NSG agreed. SLP Visit Diagnosis: Dysphagia, unspecified (R13.10) (suspect impact of Mild Cognitive decline)    Aspiration Risk  Mild aspiration risk;Risk for inadequate nutrition/hydration (reduced following general precautions)    Diet Recommendation   Mech Soft diet consistency w/ well-Cut meats, moistened foods; Thin liquids. Recommend general aspiration precautions including checking for oral clearing and reducing distractions at meals. Support at meals for Tray setup and positioning as needed for sitting Upright to eat/drink safely. Recommend foods of Choice and preference; Dietician to f/u w/ nutritional supplements as needed.   Medication Administration: Whole meds with puree (vs need to Crush in puree for safer, easier swallowing)    Other  Recommendations Recommended Consults:  (Dietician f/u) Oral Care Recommendations: Oral care BID;Oral care before and after PO;Staff/trained caregiver to provide oral care Other Recommendations:  (n/a)   Follow up Recommendations None      Frequency and Duration  (n/a)   (n/a)       Prognosis Prognosis for Safe Diet Advancement: Good Barriers to Reach Goals: Cognitive deficits;Language deficits;Severity of deficits Barriers/Prognosis Comment: Beth Franco demonstrated Mild Cognitive decline during engagement during BSE today      Swallow Study   General Date of Onset: 01/12/21 HPI: Beth Franco is a 86 y.o. female with medical history significant of  HTN, DM type II, COPD, chronic respiratory failure with hypoxia requiring 2 to 3 L oxygen via nasal cannula at night only, CAD S/P stent, unspecified stroke with residual right lower extremity weakness and dyslipidiemia presented with worsening generalized weakness, inability to  ambulate and falls.  Beth Franco normally ambulates on her own with a walker.  She had 2 falls over the last 2 days.  She was seen at [facility] last night after a fall, was found to be febrile but was discharged home after reassuring work-up.  Beth Franco was unable to get out of her bed on her own today.  Beth Franco denies any fevers at home but complains of chronic cough.  She denies any shortness of breath, chest pain, nausea, vomiting, diarrhea, dysuria.  Denies any urgency or frequency.  No loss of consciousness, seizures.  Beth Franco continues to smoke.   CXRs at admit: Cardiomegaly, with suspected mild interstitial edema. No definite  pleural effusions. Increased interstitial markings throughout both lungs may reflect  pulmonary edema, viral infection, or combination. Type of Study: Bedside Swallow Evaluation Previous Swallow Assessment: none Diet Prior to this Study: Regular;Thin liquids Temperature Spikes Noted:  (wbc 9.9;  temp elevated earlier today per NSG) Respiratory Status: Nasal cannula (2-3L) History of Recent Intubation: No Behavior/Cognition: Alert;Cooperative;Pleasant mood;Distractible;Requires cueing (paucity of speech; required verbal cues for engagement, direction and follow through) Oral Cavity Assessment: Within Functional Limits Oral Care Completed by SLP: Yes Oral Cavity - Dentition: Missing dentition;Poor condition Vision: Functional for self-feeding Self-Feeding Abilities: Able to feed self;Needs assist;Needs set up (encouragement) Beth Franco Positioning: Upright in bed (needed positioning) Baseline Vocal Quality: Normal (paucity of speech) Volitional  Cough: Strong Volitional Swallow: Able to elicit    Oral/Motor/Sensory Function Overall Oral Motor/Sensory Function: Within functional limits (grossly -- no overt unilateral oral weakness noted)   Ice Chips Ice chips: Within functional limits Presentation: Spoon (fed; 2 trials)   Thin Liquid Thin Liquid: Within functional  limits Presentation: Cup;Self Fed;Straw (10+ trials w/ both)    Nectar Thick Nectar Thick Liquid: Not tested   Honey Thick Honey Thick Liquid: Not tested   Puree Puree: Within functional limits Presentation: Spoon (fed; 5 trials)   Solid     Solid: Impaired (slight-min re: oral phase time w/ increased texture) Presentation: Self Fed (10 trials) Oral Phase Impairments: Impaired mastication (min) Oral Phase Functional Implications: Impaired mastication (min) Pharyngeal Phase Impairments:  (none) Other Comments: min longer oral phase time; munching pattern       Orinda Kenner, MS, [employer] Speech Language Pathologist Rehab Services [phone] [facility] 01/13/2021,3:11 PM

## 2021-01-13 NOTE — Progress Notes (Signed)
Mobility Specialist - Progress Note   01/13/21 1400  Mobility  Activity Refused mobility  Mobility performed by Mobility specialist    Pt sleeping on arrival, would not arouse to voice/touch. Will attempt session another date/time.    Kathee Delton Mobility Specialist 01/13/21, 2:50 PM

## 2021-01-13 NOTE — Progress Notes (Signed)
PROGRESS NOTE    Beth Franco  [id number] DOB: 10-30-1934 DOA: 01/12/2021 PCP: Juline Patch, MD   Brief Narrative: Taken from H&P. Beth Franco is a 86 y.o. female with medical history significant of  HTN, DM type II, COPD, chronic respiratory failure with hypoxia requiring 2 to 3 L oxygen via nasal cannula at night only, CAD S/P stent, unspecified stroke with residual right lower extremity weakness and dyslipidiemia presented with worsening generalized weakness, inability to ambulate and falls.  Patient normally ambulates on her own with a walker.  She had 2 falls over the last 2 days.  She was seen at [facility] last night after a fall, was found to be febrile but was discharged home after reassuring work-up.  Patient was unable to get out of her bed on her own today.  Patient denies any fevers at home but complains of chronic cough.   At baseline patient has underlying dementia, lives with her daughter, able to move around with walker, able to perform simple ADLs and rest daughter helps.  Daughter noticed more leaning towards right while resting, progressively worsening weakness and lethargy for the past couple of weeks and fever for the past 2 days.  Urine culture done on 01/11/2021 during initial ED visit was negative.  Blood cultures negative so far. COVID-19 and influenza PCR negative. CT head and cervical spine was without any acute abnormality. Chest x-ray with increased interstitial markings reflecting pulmonary edema, viral infection or combination along with right pleural effusion with adjacent airspace disease which could reflect pneumonia.  She was started on Rocephin and Zithromax.  Procalcitonin negative but she continued to spike fever.  Checking respiratory viral panel-can stop antibiotics if respiratory viral panel came back positive or repeat procalcitonin remain negative.  Subjective: Patient was seen during morning rounds today.  Appears very lethargic.  Able to  tell her name only.  Stating no to pain and wants to sleep. Talked with daughter on phone and according to her patient with worsening cough, generalized weakness and falls over the past 2 weeks.  Patient is an heavy smoker all her life. Her daughter she never noticed any choking or coughing with food.  She did noticed increased spitting of food for the past 2 weeks.  Assessment & Plan:   Principal Problem:   Pneumonia Active Problems:   Acute lower UTI   HTN (hypertension)   Type 2 diabetes mellitus with hyperlipidemia (HCC)  Concern of pneumonia.  Might be a viral pneumonia as mentioned on chest x-ray.  Small right lower lobe infiltrate.  Per nursing concern patient was holding food in her mouth and then spitting out. Initial procalcitonin negative.  Preliminary blood cultures negative.  Urine cultures negative.  Strep pneumo antigen negative. Continues to spike fever. -We will check respiratory viral panel-if came back positive then we will discontinue antibiotics. -We will repeat procalcitonin-if repeat negative we will discontinue antibiotics. -Monitor for fever curve. -Follow-up final blood culture results. -Continue ceftriaxone and Zithromax for now. -Obtain swallow evaluation-if concerning we will switch ceftriaxone with Unasyn.  Daughter did not noticed any concerns.  Chronic respiratory failure with COPD.  Patient is lifelong heavy smoker.  Uses 2 to 3 L of oxygen at night most of the time.  No wheezing.  Currently saturating in high 90s on 2 L of oxygen. -Continue with supplemental oxygen -Continue with as needed bronchodilators  Acute on chronic HFpEF.  Echocardiogram today with normal EF, grade 1 diastolic dysfunction and moderate to severe  aortic stenosis.  BNP elevated at 1111.  Chest x-ray with concern of pulmonary edema.  Clinically appears euvolemic.  She was started on IV Lasix. -Continue with IV Lasix -Strict intake and output -Daily weight and BMP  Elevated  troponin.  247>>249.  Most likely secondary to demand ischemia.  Flat curve. No chest pain.  Type 2 diabetes mellitus.  CBG within the Goal. -Continue with SSI  Hypertension.  Blood pressure mildly elevated. -Restart home losartan -Continue home metoprolol  Dyslipidemia. -Continue statin  Generalized weakness associated with falls.  No acute injury -PT is recommending SNF -TOC consult   Objective: Vitals:   01/13/21 0437 01/13/21 0452 01/13/21 0900 01/13/21 1239  BP:   (!) 175/74 (!) 151/55  Pulse:   89 68  Resp: '16  16 18  '$ Temp: 98.1 F (36.7 C)  (!) 100.5 F (38.1 C) (!) 101.6 F (38.7 C)  TempSrc:   Oral   SpO2:   96% 99%  Weight:  67.6 kg    Height:        Intake/Output Summary (Last 24 hours) at 01/13/2021 1437 Last data filed at 01/13/2021 1301 Gross per 24 hour  Intake --  Output 1750 ml  Net -1750 ml   Filed Weights   01/12/21 2249 01/13/21 0452  Weight: 70.8 kg 67.6 kg    Examination:  General exam: Appears calm and comfortable, appears very lethargic Respiratory system: Clear to auscultation. Respiratory effort normal. Cardiovascular system: S1 & S2 heard, RRR.  Gastrointestinal system: Soft, nontender, nondistended, bowel sounds positive. Central nervous system: Alert and oriented. No focal neurological deficits. Extremities: No edema, no cyanosis, pulses intact and symmetrical. Psychiatry: Judgement and insight appear impaired.   DVT prophylaxis: Lovenox Code Status: DNR Family Communication: Daughter was updated on phone. Disposition Plan:  Status is: Inpatient  Remains inpatient appropriate because:Inpatient level of care appropriate due to severity of illness  Dispo: The patient is from: Home              Anticipated d/c is to: SNF              Patient currently is not medically stable to d/c.   Difficult to place patient No              Level of care: Med-Surg  All the records are reviewed and case discussed with Care  Management/Social Worker. Management plans discussed with the patient, nursing and they are in agreement.  Consultants:  None  Procedures:  Antimicrobials:  Ceftriaxone Zithromax  Data Reviewed: I have personally reviewed following labs and imaging studies  CBC: Recent Labs  Lab 01/12/21 0002 01/12/21 1353 01/13/21 0636  WBC 8.5 7.9 9.9  NEUTROABS 6.8  --   --   HGB 10.8* 11.0* 10.2*  HCT 35.4* 34.1* 30.9*  MCV 95.9 94.2 91.7  PLT 374 366 [id number]   Basic Metabolic Panel: Recent Labs  Lab 01/12/21 0002 01/12/21 1353 01/13/21 0636  NA 139 138 135  K 4.3 3.9 3.6  CL 103 101 97*  CO2 '26 26 28  '$ GLUCOSE 131* 126* 122*  BUN '19 16 17  '$ CREATININE 1.07* 0.92 0.85  CALCIUM 9.0 8.6* 8.1*   GFR: Estimated Creatinine Clearance: 44.3 mL/min (by C-G formula based on SCr of 0.85 mg/dL). Liver Function Tests: Recent Labs  Lab 01/12/21 0002 01/13/21 0636  AST 17 16  ALT 22 19  ALKPHOS 61 55  BILITOT 0.5 1.0  PROT 6.7 6.8  ALBUMIN 3.0* 3.0*   No  results for input(s): LIPASE, AMYLASE in the last 168 hours. No results for input(s): AMMONIA in the last 168 hours. Coagulation Profile: Recent Labs  Lab 01/12/21 0002  INR 1.0   Cardiac Enzymes: Recent Labs  Lab 01/12/21 0002  CKTOTAL 31*   BNP (last 3 results) No results for input(s): PROBNP in the last 8760 hours. HbA1C: No results for input(s): HGBA1C in the last 72 hours. CBG: Recent Labs  Lab 01/12/21 1858 01/12/21 2113 01/12/21 2258 01/13/21 0942 01/13/21 1244  GLUCAP 116* 150* 158* 132* 139*   Lipid Profile: No results for input(s): CHOL, HDL, LDLCALC, TRIG, CHOLHDL, LDLDIRECT in the last 72 hours. Thyroid Function Tests: No results for input(s): TSH, T4TOTAL, FREET4, T3FREE, THYROIDAB in the last 72 hours. Anemia Panel: Recent Labs    01/13/21 0636  VITAMINB12 154*   Sepsis Labs: Recent Labs  Lab 01/12/21 0002 01/12/21 1558 01/12/21 2211 01/13/21 0636  PROCALCITON  --   --   --  <0.10   LATICACIDVEN 1.4 0.7 0.9  --     Recent Results (from the past 240 hour(s))  Urine Culture     Status: None   Collection Time: 01/11/21 11:41 PM   Specimen: In/Out Cath Urine  Result Value Ref Range Status   Specimen Description IN/OUT CATH URINE  Final   Special Requests NONE  Final   Culture   Final    NO GROWTH Performed at [facility], [place] [address] [place], [place] [address]    Report Status 01/13/2021 FINAL  Final  Resp Panel by RT-PCR (Flu A&B, Covid) Nasopharyngeal Swab     Status: None   Collection Time: 01/12/21 12:02 AM   Specimen: Nasopharyngeal Swab; Nasopharyngeal(NP) swabs in vial transport medium  Result Value Ref Range Status   SARS Coronavirus 2 by RT PCR NEGATIVE NEGATIVE Final    Comment: (NOTE) SARS-CoV-2 target nucleic acids are NOT DETECTED.  The SARS-CoV-2 RNA is generally detectable in upper respiratory specimens during the acute phase of infection. The lowest concentration of SARS-CoV-2 viral copies this assay can detect is 138 copies/mL. A negative result does not preclude SARS-Cov-2 infection and should not be used as the sole basis for treatment or other patient management decisions. A negative result may occur with  improper specimen collection/handling, submission of specimen other than nasopharyngeal swab, presence of viral mutation(s) within the areas targeted by this assay, and inadequate number of viral copies(<138 copies/mL). A negative result must be combined with clinical observations, patient history, and epidemiological information. The expected result is Negative.  Fact Sheet for Patients:  EntrepreneurPulse.com.au  Fact Sheet for Healthcare Providers:  IncredibleEmployment.be  This test is no t yet approved or cleared by the [place] FDA and  has been authorized for detection and/or diagnosis of SARS-CoV-2 by FDA under an Emergency Use Authorization (EUA). This EUA will remain  in  effect (meaning this test can be used) for the duration of the COVID-19 declaration under Section 564(b)(1) of the Act, 21 U.S.C.section 360bbb-3(b)(1), unless the authorization is terminated  or revoked sooner.       Influenza A by PCR NEGATIVE NEGATIVE Final   Influenza B by PCR NEGATIVE NEGATIVE Final    Comment: (NOTE) The Xpert Xpress SARS-CoV-2/FLU/RSV plus assay is intended as an aid in the diagnosis of influenza from Nasopharyngeal swab specimens and should not be used as a sole basis for treatment. Nasal washings and aspirates are unacceptable for Xpert Xpress SARS-CoV-2/FLU/RSV testing.  Fact Sheet for Patients: EntrepreneurPulse.com.au  Fact Sheet for Healthcare Providers: IncredibleEmployment.be  This test is not yet approved or cleared by the [place] FDA and has been authorized for detection and/or diagnosis of SARS-CoV-2 by FDA under an Emergency Use Authorization (EUA). This EUA will remain in effect (meaning this test can be used) for the duration of the COVID-19 declaration under Section 564(b)(1) of the Act, 21 U.S.C. section 360bbb-3(b)(1), unless the authorization is terminated or revoked.  Performed at [facility], [place] [address] [place], [place] [address]   Blood Culture (routine x 2)     Status: None (Preliminary result)   Collection Time: 01/12/21 12:02 AM   Specimen: BLOOD  Result Value Ref Range Status   Specimen Description BLOOD RIGHT ANTECUBITAL  Final   Special Requests   Final    BOTTLES DRAWN AEROBIC AND ANAEROBIC Blood Culture adequate volume   Culture   Final    NO GROWTH 1 DAY Performed at [facility], [place] [address], [place], [place] [address]    Report Status PENDING  Incomplete  Blood Culture (routine x 2)     Status: None (Preliminary result)   Collection Time: 01/12/21 12:07 AM   Specimen: BLOOD  Result Value Ref Range Status   Specimen Description BLOOD SITE NOT SPECIFIED   Final   Special Requests   Final    BOTTLES DRAWN AEROBIC AND ANAEROBIC Blood Culture results may not be optimal due to an inadequate volume of blood received in culture bottles   Culture   Final    NO GROWTH 1 DAY Performed at [facility], [place] [address], [place], [place] [address]    Report Status PENDING  Incomplete  Resp Panel by RT-PCR (Flu A&B, Covid) Nasopharyngeal Swab     Status: None   Collection Time: 01/12/21  1:53 PM   Specimen: Nasopharyngeal Swab; Nasopharyngeal(NP) swabs in vial transport medium  Result Value Ref Range Status   SARS Coronavirus 2 by RT PCR NEGATIVE NEGATIVE Final    Comment: (NOTE) SARS-CoV-2 target nucleic acids are NOT DETECTED.  The SARS-CoV-2 RNA is generally detectable in upper respiratory specimens during the acute phase of infection. The lowest concentration of SARS-CoV-2 viral copies this assay can detect is 138 copies/mL. A negative result does not preclude SARS-Cov-2 infection and should not be used as the sole basis for treatment or other patient management decisions. A negative result may occur with  improper specimen collection/handling, submission of specimen other than nasopharyngeal swab, presence of viral mutation(s) within the areas targeted by this assay, and inadequate number of viral copies(<138 copies/mL). A negative result must be combined with clinical observations, patient history, and epidemiological information. The expected result is Negative.  Fact Sheet for Patients:  EntrepreneurPulse.com.au  Fact Sheet for Healthcare Providers:  IncredibleEmployment.be  This test is no t yet approved or cleared by the [place] FDA and  has been authorized for detection and/or diagnosis of SARS-CoV-2 by FDA under an Emergency Use Authorization (EUA). This EUA will remain  in effect (meaning this test can be used) for the duration of the COVID-19 declaration under Section 564(b)(1) of  the Act, 21 U.S.C.section 360bbb-3(b)(1), unless the authorization is terminated  or revoked sooner.       Influenza A by PCR NEGATIVE NEGATIVE Final   Influenza B by PCR NEGATIVE NEGATIVE Final    Comment: (NOTE) The Xpert Xpress SARS-CoV-2/FLU/RSV plus assay is intended as an aid in the diagnosis of influenza from Nasopharyngeal swab specimens and  should not be used as a sole basis for treatment. Nasal washings and aspirates are unacceptable for Xpert Xpress SARS-CoV-2/FLU/RSV testing.  Fact Sheet for Patients: EntrepreneurPulse.com.au  Fact Sheet for Healthcare Providers: IncredibleEmployment.be  This test is not yet approved or cleared by the [place] FDA and has been authorized for detection and/or diagnosis of SARS-CoV-2 by FDA under an Emergency Use Authorization (EUA). This EUA will remain in effect (meaning this test can be used) for the duration of the COVID-19 declaration under Section 564(b)(1) of the Act, 21 U.S.C. section 360bbb-3(b)(1), unless the authorization is terminated or revoked.  Performed at [facility], [address] [place], [place] [address]   Blood culture (routine x 2)     Status: None (Preliminary result)   Collection Time: 01/12/21  3:58 PM   Specimen: BLOOD  Result Value Ref Range Status   Specimen Description BLOOD BLOOD LEFT WRIST  Final   Special Requests   Final    BOTTLES DRAWN AEROBIC AND ANAEROBIC Blood Culture adequate volume   Culture   Final    NO GROWTH < 24 HOURS Performed at [facility], [address] [place], [place] [address]    Report Status PENDING  Incomplete  Blood culture (routine x 2)     Status: None (Preliminary result)   Collection Time: 01/12/21  3:58 PM   Specimen: BLOOD  Result Value Ref Range Status   Specimen Description BLOOD LEFT ANTECUBITAL  Final   Special Requests   Final    BOTTLES DRAWN AEROBIC AND ANAEROBIC Blood Culture adequate  volume   Culture   Final    NO GROWTH < 24 HOURS Performed at [facility], [address], [place], [place] [address]    Report Status PENDING  Incomplete     Radiology Studies: CT HEAD WO CONTRAST (5MM)  Result Date: 01/12/2021 CLINICAL DATA:  Found down, confusion EXAM: CT HEAD WITHOUT CONTRAST CT CERVICAL SPINE WITHOUT CONTRAST TECHNIQUE: Multidetector CT imaging of the head and cervical spine was performed following the standard protocol without intravenous contrast. Multiplanar CT image reconstructions of the cervical spine were also generated. COMPARISON:  None. FINDINGS: CT HEAD FINDINGS Brain: No evidence of acute infarction, hemorrhage, hydrocephalus, extra-axial collection or mass lesion/mass effect. Subcortical white matter and periventricular small vessel ischemic changes. Vascular: Intracranial atherosclerosis. Skull: Normal. Negative for fracture or focal lesion. Sinuses/Orbits: The visualized paranasal sinuses are essentially clear. The mastoid air cells are unopacified. Other: None. CT CERVICAL SPINE FINDINGS Alignment: Normal cervical lordosis. Skull base and vertebrae: No acute fracture. No primary bone lesion or focal pathologic process. Soft tissues and spinal canal: No prevertebral fluid or swelling. No visible canal hematoma. Disc levels: Mild degenerative changes of the mid cervical spine. Spinal canal is patent. Upper chest: Visualized lung apices are notable for layering small pleural effusions. Other: None. IMPRESSION: No evidence of acute intracranial abnormality. Small vessel ischemic changes. No evidence of traumatic injury to the cervical spine. Mild degenerative changes. Small layering pleural effusions. Electronically Signed   By: Julian Hy M.D.   On: 01/12/2021 01:00   CT Cervical Spine Wo Contrast  Result Date: 01/12/2021 CLINICAL DATA:  Found down, confusion EXAM: CT HEAD WITHOUT CONTRAST CT CERVICAL SPINE WITHOUT CONTRAST TECHNIQUE: Multidetector  CT imaging of the head and cervical spine was performed following the standard protocol without intravenous contrast. Multiplanar CT image reconstructions of the cervical spine were also generated. COMPARISON:  None. FINDINGS: CT HEAD FINDINGS Brain: No evidence of acute  infarction, hemorrhage, hydrocephalus, extra-axial collection or mass lesion/mass effect. Subcortical white matter and periventricular small vessel ischemic changes. Vascular: Intracranial atherosclerosis. Skull: Normal. Negative for fracture or focal lesion. Sinuses/Orbits: The visualized paranasal sinuses are essentially clear. The mastoid air cells are unopacified. Other: None. CT CERVICAL SPINE FINDINGS Alignment: Normal cervical lordosis. Skull base and vertebrae: No acute fracture. No primary bone lesion or focal pathologic process. Soft tissues and spinal canal: No prevertebral fluid or swelling. No visible canal hematoma. Disc levels: Mild degenerative changes of the mid cervical spine. Spinal canal is patent. Upper chest: Visualized lung apices are notable for layering small pleural effusions. Other: None. IMPRESSION: No evidence of acute intracranial abnormality. Small vessel ischemic changes. No evidence of traumatic injury to the cervical spine. Mild degenerative changes. Small layering pleural effusions. Electronically Signed   By: Julian Hy M.D.   On: 01/12/2021 01:00   DG Chest Portable 1 View  Result Date: 01/12/2021 CLINICAL DATA:  Fever EXAM: PORTABLE CHEST 1 VIEW COMPARISON:  Chest radiograph 01/13/2011 FINDINGS: The heart is enlarged, unchanged. The mediastinal contours are stable. There are patchy interstitial markings throughout both lungs. There is increased density in the right base with a new small right pleural effusion. There is no significant left effusion. There is no pneumothorax. There is no acute osseous abnormality. IMPRESSION: 1. Increased interstitial markings throughout both lungs may reflect pulmonary  edema, viral infection, or combination. 2. Increased conspicuity of a small right pleural effusion with adjacent airspace disease which could reflect pneumonia in the correct clinical setting. 3. Cardiomegaly. Electronically Signed   By: Valetta Mole M.D.   On: 01/12/2021 16:05   DG Chest Port 1 View  Result Date: 01/12/2021 CLINICAL DATA:  Found down, confusion EXAM: PORTABLE CHEST 1 VIEW COMPARISON:  None. FINDINGS: Increased interstitial markings. No definite pleural effusions. No pneumothorax. Cardiomegaly.  Thoracic aortic atherosclerosis. IMPRESSION: Cardiomegaly with suspected mild interstitial edema. No definite pleural effusions. Electronically Signed   By: Julian Hy M.D.   On: 01/12/2021 00:49   ECHOCARDIOGRAM COMPLETE  Result Date: 01/13/2021    ECHOCARDIOGRAM REPORT   Patient Name:   ALLYNA SHARER Date of Exam: 01/13/2021 Medical Rec #:  [id number]    Height:       65.0 in Accession #:    [id number]   Weight:       149.0 lb Date of Birth:  06/19/1934    BSA:          1.746 m Patient Age:    23 years     BP:           161/52 mmHg Patient Gender: F            HR:           101 bpm. Exam Location:  [facility] Procedure: 2D Echo, Color Doppler, Cardiac Doppler and Intracardiac            Opacification Agent Indications:     R06.00 Dyspnea  History:         Patient has no prior history of Echocardiogram examinations.                  CAD, Stroke and COPD; Risk Factors:Hypertension, Diabetes and                  HCL.  Sonographer:     Charmayne Sheer Referring Phys:  [id number] Aline August Diagnosing Phys: Nelva Bush MD  Sonographer Comments: Technically difficult study due to poor echo windows.  IMPRESSIONS  1. Left ventricular ejection fraction, by estimation, is 55 to 60%. The left ventricle has normal function. Left ventricular endocardial border not optimally defined to evaluate regional wall motion. The left ventricular internal cavity size was mildly dilated. There is mild left ventricular  hypertrophy. Left ventricular diastolic parameters are consistent with Grade I diastolic dysfunction (impaired relaxation). Elevated left atrial pressure.  2. Right ventricular systolic function is normal. The right ventricular size is normal. Tricuspid regurgitation signal is inadequate for assessing PA pressure.  3. The mitral valve was not well visualized. No evidence of mitral valve regurgitation. No evidence of mitral stenosis.  4. The aortic valve has an indeterminant number of cusps. There is moderate calcification of the aortic valve. There is moderate thickening of the aortic valve. Aortic valve regurgitation is not visualized. Moderate to severe aortic valve stenosis. Aortic valve area, by VTI measures 1.11 cm. Aortic valve mean gradient measures 14.0 mmHg.  5. The inferior vena cava is normal in size with <50% respiratory variability, suggesting right atrial pressure of 8 mmHg. FINDINGS  Left Ventricle: Left ventricular ejection fraction, by estimation, is 55 to 60%. The left ventricle has normal function. Left ventricular endocardial border not optimally defined to evaluate regional wall motion. Definity contrast agent was given IV to delineate the left ventricular endocardial borders. The left ventricular internal cavity size was mildly dilated. There is mild left ventricular hypertrophy. Left ventricular diastolic parameters are consistent with Grade I diastolic dysfunction (impaired relaxation). Elevated left atrial pressure. Right Ventricle: The right ventricular size is normal. No increase in right ventricular wall thickness. Right ventricular systolic function is normal. Tricuspid regurgitation signal is inadequate for assessing PA pressure. Left Atrium: Left atrial size was normal in size. Right Atrium: Right atrial size was normal in size. Pericardium: There is no evidence of pericardial effusion. Mitral Valve: The mitral valve was not well visualized. There is mild thickening of the mitral  valve leaflet(s). There is mild calcification of the mitral valve leaflet(s). Mild to moderate mitral annular calcification. No evidence of mitral valve regurgitation. No evidence of mitral valve stenosis. MV peak gradient, 3.7 mmHg. The mean mitral valve gradient is 1.0 mmHg. Tricuspid Valve: The tricuspid valve is not well visualized. Tricuspid valve regurgitation is trivial. Aortic Valve: The aortic valve has an indeterminant number of cusps. There is moderate calcification of the aortic valve. There is moderate thickening of the aortic valve. Aortic valve regurgitation is not visualized. Moderate to severe aortic stenosis is present. Aortic valve mean gradient measures 14.0 mmHg. Aortic valve peak gradient measures 22.7 mmHg. Aortic valve area, by VTI measures 1.11 cm. Pulmonic Valve: The pulmonic valve was not well visualized. Pulmonic valve regurgitation is not visualized. No evidence of pulmonic stenosis. Aorta: The aortic root is normal in size and structure. Pulmonary Artery: The pulmonary artery is of normal size. Venous: The inferior vena cava is normal in size with less than 50% respiratory variability, suggesting right atrial pressure of 8 mmHg. IAS/Shunts: The interatrial septum was not well visualized.  LEFT VENTRICLE PLAX 2D LVIDd:         5.20 cm     Diastology LVIDs:         4.60 cm     LV e' medial:    3.48 cm/s LV PW:         1.10 cm     LV E/e' medial:  24.6 LV IVS:        0.80 cm     LV  e' lateral:   6.53 cm/s LVOT diam:     2.10 cm     LV E/e' lateral: 13.1 LV SV:         50 LV SV Index:   29 LVOT Area:     3.46 cm  LV Volumes (MOD) LV vol d, MOD A2C: 84.7 ml LV vol d, MOD A4C: 84.4 ml LV vol s, MOD A2C: 57.0 ml LV vol s, MOD A4C: 56.4 ml LV SV MOD A2C:     27.7 ml LV SV MOD A4C:     84.4 ml LV SV MOD BP:      30.0 ml RIGHT VENTRICLE RV Basal diam:  3.20 cm LEFT ATRIUM             Index       RIGHT ATRIUM           Index LA diam:        3.60 cm 2.06 cm/m  RA Area:     17.70 cm LA Vol  (A2C):   51.3 ml 29.39 ml/m RA Volume:   49.10 ml  28.13 ml/m LA Vol (A4C):   48.5 ml 27.78 ml/m LA Biplane Vol: 50.1 ml 28.70 ml/m  AORTIC VALVE                    PULMONIC VALVE AV Area (Vmax):    1.23 cm     PV Vmax:       1.03 m/s AV Area (Vmean):   1.12 cm     PV Vmean:      79.700 cm/s AV Area (VTI):     1.11 cm     PV VTI:        0.209 m AV Vmax:           238.00 cm/s  PV Peak grad:  4.2 mmHg AV Vmean:          174.500 cm/s PV Mean grad:  3.0 mmHg AV VTI:            0.450 m AV Peak Grad:      22.7 mmHg AV Mean Grad:      14.0 mmHg LVOT Vmax:         84.20 cm/s LVOT Vmean:        56.200 cm/s LVOT VTI:          0.144 m LVOT/AV VTI ratio: 0.32  AORTA Ao Root diam: 3.20 cm MITRAL VALVE MV Area (PHT): 3.79 cm    SHUNTS MV Area VTI:   2.45 cm    Systemic VTI:  0.14 m MV Peak grad:  3.7 mmHg    Systemic Diam: 2.10 cm MV Mean grad:  1.0 mmHg MV Vmax:       0.97 m/s MV Vmean:      50.5 cm/s MV Decel Time: 200 msec MV E velocity: 85.70 cm/s MV A velocity: 95.10 cm/s MV E/A ratio:  0.90 Christopher End MD Electronically signed by Nelva Bush MD Signature Date/Time: 01/13/2021/1:45:20 PM    Final     Scheduled Meds:  enoxaparin (LOVENOX) injection  40 mg Subcutaneous Q24H   feeding supplement (GLUCERNA SHAKE)  237 mL Oral TID BM   furosemide  40 mg Intravenous Daily   insulin aspart  0-15 Units Subcutaneous TID WC   insulin aspart  0-5 Units Subcutaneous QHS   metoprolol tartrate  50 mg Oral BID   sodium chloride flush  3 mL Intravenous Q12H   Continuous Infusions:  azithromycin     cefTRIAXone (ROCEPHIN)  IV       LOS: 0 days   Time spent: 40 minutes. More than 50% of the time was spent in counseling/coordination of care  Lorella Nimrod, MD [facility]  If 7PM-7AM, please contact night-coverage [email or web address]  01/13/2021, 2:37 PM   This record has been created using Systems analyst. Errors have been sought and corrected,but may not always be located. Such  creation errors do not reflect on the standard of care.

## 2021-01-13 NOTE — Progress Notes (Signed)
*  PRELIMINARY RESULTS* Echocardiogram 2D Echocardiogram has been performed.  Beth Franco 01/13/2021, 10:38 AM

## 2021-01-13 NOTE — Evaluation (Signed)
Physical Therapy Evaluation Patient Details Name: Beth Franco MRN: [id number] DOB: Mar 13, 1935 Today's Date: 01/13/2021  History of Present Illness  86 y.o. female with medical history significant of  HTN, DM type II, COPD, chronic respiratory failure with hypoxia requiring 2 to 3 L oxygen via nasal cannula at night only, CAD S/P stent, unspecified stroke with residual right lower extremity weakness and dyslipidiemia presented with worsening generalized weakness, inability to ambulate and falls.  Clinical Impression  Pt received supine in bed, HOB elevated with rightward lean, eating breakfast. Pt agreeable to evaluation. She lives with her daughter who assists with care giving. Pt is typically able to ambulate short distances using RW and use the restroom independently during the day when her daughter is at work. Today pt demo deficits in strength (RLE weaker than LLE baseline 2/2 previous CVA), ROM, sitting balance. Due to MAX A to maintain sitting balance, standing activity was deferred. Rec assist of 2 people for future treatment sessions.   Decreased attention throughout session with frequent redirection to task. PT used pt name to gain attention and repeated multiple questions during history and commands during eval. She appeared to have difficulty swallowing her eggs - PT communicated with RN and rec SLP consult. Would benefit from skilled PT to address above deficits and promote optimal return to PLOF.       Recommendations for follow up therapy are one component of a multi-disciplinary discharge planning process, led by the attending physician.  Recommendations may be updated based on patient status, additional functional criteria and insurance authorization.  Follow Up Recommendations SNF;Supervision/Assistance - 24 hour    Equipment Recommendations  Other (comment) (TBD at next venue of care)    Recommendations for Other Services OT consult     Precautions / Restrictions  Precautions Precautions: Fall Restrictions Weight Bearing Restrictions: No      Mobility  Bed Mobility Overal bed mobility: Needs Assistance Bed Mobility: Supine to Sit;Sit to Supine;Rolling Rolling: Mod assist   Supine to sit: Max assist;HOB elevated Sit to supine: Max assist   General bed mobility comments: MAX A to manage trunk and BLE    Transfers                 General transfer comment: deferred - unable to maintain sitting balance  Ambulation/Gait                Stairs            Wheelchair Mobility    Modified Rankin (Stroke Patients Only)       Balance Overall balance assessment: Needs assistance Sitting-balance support: Bilateral upper extremity supported Sitting balance-Leahy Scale: Poor Sitting balance - Comments: Rightward lean with no attempt to self-correct. Required MAX A to maintain sitting EOB, unable to scoot forward - PT performed scoot via chuck pad while stabilizing trunk. Postural control: Right lateral lean     Standing balance comment: standing deferred due to sitting balance impairment                             Pertinent Vitals/Pain Pain Assessment: No/denies pain    Home Living Family/patient expects to be discharged to:: Private residence Living Arrangements: Children (daughter) Available Help at Discharge: Family;Available PRN/intermittently Type of Home: House Home Access: Stairs to enter Entrance Stairs-Rails: None Entrance Stairs-Number of Steps: 1 Home Layout: One level Home Equipment: Walker - 2 wheels;Bedside commode;Grab bars - tub/shower;Grab bars - toilet;Shower seat  Prior Function Level of Independence: Needs assistance   Gait / Transfers Assistance Needed: states she was able to ambulate and toilet Mod I using RW while daughter was at work during the day  ADL's / [employer] Assistance Needed: assist with all ADLs  Comments: Difficulty obtaining information, unsure of  accuracy.     Hand Dominance        Extremity/Trunk Assessment   Upper Extremity Assessment Upper Extremity Assessment: Generalized weakness    Lower Extremity Assessment Lower Extremity Assessment: Generalized weakness;RLE deficits/detail RLE Deficits / Details: increased RLE weakness 2/2 to past CVA, inreased tone/decreased ROM in R ankle    Cervical / Trunk Assessment Cervical / Trunk Assessment: Kyphotic  Communication      Cognition Arousal/Alertness: Awake/alert Behavior During Therapy: WFL for tasks assessed/performed Overall Cognitive Status: No family/caregiver present to determine baseline cognitive functioning                                 General Comments: Oriented to self, "hospital" (unsure of town), and situation. Unsure of date. Pt demo decreased attention to task throughout session, required frequent redirection.      General Comments      Exercises     Assessment/Plan    PT Assessment Patient needs continued PT services  PT Problem List Decreased strength;Impaired tone;Decreased range of motion;Decreased activity tolerance;Decreased safety awareness;Decreased balance;Decreased mobility       PT Treatment Interventions DME instruction;Balance training;Gait training;Neuromuscular re-education;Stair training;Functional mobility training;Patient/family education;Therapeutic activities;Therapeutic exercise    PT Goals (Current goals can be found in the Care Plan section)  Acute Rehab PT Goals Patient Stated Goal: none stated PT Goal Formulation: With patient Time For Goal Achievement: 01/27/21 Potential to Achieve Goals: Fair    Frequency Min 2X/week   Barriers to discharge Decreased caregiver support daughter works during the day; pt requires 24-hour assist    Co-evaluation               AM-PAC PT "6 Clicks" Mobility  Outcome Measure Help needed turning from your back to your side while in a flat bed without using  bedrails?: A Little Help needed moving from lying on your back to sitting on the side of a flat bed without using bedrails?: A Lot Help needed moving to and from a bed to a chair (including a wheelchair)?: A Lot Help needed standing up from a chair using your arms (e.g., wheelchair or bedside chair)?: A Lot Help needed to walk in hospital room?: Total Help needed climbing 3-5 steps with a railing? : Total 6 Click Score: 11    End of Session   Activity Tolerance: Patient limited by fatigue Patient left: in bed;with call bell/phone within reach;with bed alarm set Nurse Communication: Mobility status PT Visit Diagnosis: Unsteadiness on feet (R26.81);Other abnormalities of gait and mobility (R26.89);History of falling (Z91.81);Repeated falls (R29.6);Muscle weakness (generalized) (M62.81)    Time: [id number] PT Time Calculation (min) (ACUTE ONLY): 35 min   Charges:   PT Evaluation $PT Eval Moderate Complexity: 1 Mod PT Treatments $Therapeutic Exercise: 8-22 mins $Neuromuscular Re-education: 8-22 mins        Patrina Levering PT, DPT 01/13/21 1:38 PM [id number]   [facility] 01/13/2021, 1:38 PM

## 2021-01-14 DIAGNOSIS — I10 Essential (primary) hypertension: Secondary | ICD-10-CM | POA: Diagnosis not present

## 2021-01-14 DIAGNOSIS — E538 Deficiency of other specified B group vitamins: Secondary | ICD-10-CM

## 2021-01-14 DIAGNOSIS — E1169 Type 2 diabetes mellitus with other specified complication: Secondary | ICD-10-CM | POA: Diagnosis not present

## 2021-01-14 DIAGNOSIS — J189 Pneumonia, unspecified organism: Secondary | ICD-10-CM | POA: Diagnosis not present

## 2021-01-14 LAB — PROCALCITONIN: Procalcitonin: <0.1 ng/mL

## 2021-01-14 LAB — LEGIONELLA PNEUMOPHILA SEROGP 1 UR AG: L. pneumophila Serogp 1 Ur Ag: NEGATIVE

## 2021-01-14 LAB — TROPONIN I (HIGH SENSITIVITY): Troponin I (High Sensitivity): 116 ng/L (ref <18)

## 2021-01-14 LAB — GLUCOSE, CAPILLARY
Glucose-Capillary: 114 mg/dL — ABNORMAL HIGH (ref 70–99)
Glucose-Capillary: 136 mg/dL — ABNORMAL HIGH (ref 70–99)
Glucose-Capillary: 155 mg/dL — ABNORMAL HIGH (ref 70–99)
Glucose-Capillary: 132 mg/dL — ABNORMAL HIGH (ref 70–99)

## 2021-01-14 LAB — RSV(RESPIRATORY SYNCYTIAL VIRUS) AB, BLOOD: RSV Ab: NEGATIVE

## 2021-01-14 MED ORDER — ORAL CARE MOUTH RINSE
15.0000 mL | Freq: Two times a day (BID) | OROMUCOSAL | Status: DC
  Administered 2021-01-14 – 2021-01-19 (×9): 15 mL via OROMUCOSAL

## 2021-01-14 MED ORDER — CHLORHEXIDINE GLUCONATE 0.12 % MT SOLN
15.0000 mL | Freq: Two times a day (BID) | OROMUCOSAL | Status: DC
  Administered 2021-01-14 – 2021-01-19 (×11): 15 mL via OROMUCOSAL
  Filled 2021-01-14 (×8): qty 15

## 2021-01-14 MED ORDER — VITAMIN B-12 1000 MCG PO TABS
1000.0000 ug | ORAL_TABLET | Freq: Every day | ORAL | Status: DC
  Administered 2021-01-19: 1000 ug via ORAL
  Filled 2021-01-14: qty 1

## 2021-01-14 MED ORDER — CYANOCOBALAMIN 1000 MCG/ML IJ SOLN
1000.0000 ug | Freq: Every day | INTRAMUSCULAR | Status: AC
  Administered 2021-01-14 – 2021-01-18 (×5): 1000 ug via INTRAMUSCULAR
  Filled 2021-01-14 (×5): qty 1

## 2021-01-14 NOTE — Progress Notes (Signed)
PROGRESS NOTE    Beth Franco  [id number] DOB: 07/08/1934 DOA: 01/12/2021 PCP: Juline Patch, MD   Brief Narrative: Taken from H&P. Beth Franco is a 86 y.o. female with medical history significant of  HTN, DM type II, COPD, chronic respiratory failure with hypoxia requiring 2 to 3 L oxygen via nasal cannula at night only, CAD S/P stent, unspecified stroke with residual right lower extremity weakness and dyslipidiemia presented with worsening generalized weakness, inability to ambulate and falls.  She had 2 falls over 2 days.  At baseline patient has underlying dementia, lives with her daughter, able to move around with walker, able to perform simple ADLs and rest daughter helps.   Urine culture done on 01/11/2021 during initial ED visit was negative.  Blood cultures negative so far. COVID-19 and influenza PCR negative. CT head and cervical spine was without any acute abnormality. Chest x-ray with increased interstitial markings reflecting pulmonary edema, viral infection or combination along with right pleural effusion with adjacent airspace disease which could reflect pneumonia.  She was started on Rocephin and Zithromax.  Procalcitonin negative but she continued to spike fever.     Subjective: Patient continues to feel fatigued.  Denies any chest pain.  Occasional cough.  No nausea or vomiting.  Somewhat distracted.    Assessment & Plan:   Community-acquired pneumonia Initial films were not conclusive however repeat x-ray raised concern for viral pneumonia versus pulmonary edema.  Patient was placed on ceftriaxone and azithromycin.  Also placed on furosemide due to concern for pulmonary edema and elevated BNP.  WBC was noted to be normal.  Lactic acid level was normal.  COVID-19 and influenza PCR's were negative.  Procalcitonin was less than 0.1.   Echocardiogram showed normal systolic function with grade 1 diastolic dysfunction. Patient had a fever with temperature of 101.6 yesterday  afternoon.  None since then.  Even though this appears to be primarily a viral infection we will continue with antibacterials for now. Seen by speech therapy.  Aspiration precautions but no clear dysphagia noted on examination.  Remains on mechanical soft diet consistency.  Chronic respiratory failure with COPD   Patient is lifelong heavy smoker.  Uses 2 to 3 L of oxygen at night most of the time.  Continue with supplemental oxygen.  Occasional wheezes appreciated.  Bronchodilators as needed.    Acute on chronic HFpEF Echocardiogram today with normal EF, grade 1 diastolic dysfunction and moderate to severe aortic stenosis.  BNP elevated at 1111.  Chest x-ray with concern of pulmonary edema.  Clinically appears euvolemic.  She was started on IV Lasix. Continue with IV diuretics for now.  Check electrolytes in the morning. Strict ins and outs and daily weights.  Elevated troponin 247>>249.  Most likely secondary to demand ischemia.  Flat curve. No chest pain.    Do not anticipate any further testing at this time.  Type 2 diabetes mellitus.   Monitor CBGs.  Continue SSI.    Normocytic anemia/B12 deficiency B12 noted to be 154.  Will start supplementation.  Give intramuscularly while she is here in the hospital.  We will also check a rest of anemia panel.  Essential hypertension Remains on losartan and metoprolol.  Monitor blood pressures closely.    Previous history of stroke with right-sided weakness Stable.  PT and OT evaluation.  Continue Plavix.  Dyslipidemia. -Continue statin  Generalized weakness associated with falls.  No acute injury -PT is recommending SNF -TOC consult  DVT prophylaxis: Lovenox Code Status: DNR  Family Communication: No family at bedside Disposition Plan: SNF when improved  Status is: Inpatient  Remains inpatient appropriate because:Inpatient level of care appropriate due to severity of illness  Dispo: The patient is from: Home              Anticipated  d/c is to: SNF              Patient currently is not medically stable to d/c.   Difficult to place patient No              Level of care: Med-Surg   Objective: Vitals:   01/14/21 0600 01/14/21 0646 01/14/21 0739 01/14/21 1159  BP:   (!) 145/54 (!) 146/66  Pulse:   77 69  Resp: (!) '21  18 17  '$ Temp:   98.8 F (37.1 C) 97.8 F (36.6 C)  TempSrc:   Oral   SpO2:   96% 94%  Weight:  70 kg    Height:        Intake/Output Summary (Last 24 hours) at 01/14/2021 1314 Last data filed at 01/14/2021 1159 Gross per 24 hour  Intake 710 ml  Output 900 ml  Net -190 ml    Filed Weights   01/13/21 0452 01/14/21 0423 01/14/21 0646  Weight: 67.6 kg 70 kg 70 kg    Examination:  General appearance: Awake alert.  In no distress Resp: Mildly tachypneic at rest.  No use of accessory muscles.  Occasional wheezing heard bilaterally.  Few crackles at the bases. Cardio: S1-S2 is normal regular.  No S3-S4.  No rubs murmurs or bruit GI: Abdomen is soft.  Nontender nondistended.  Bowel sounds are present normal.  No masses organomegaly Extremities: No edema.  Moving all 4 extremities Neurologic: Mildly distracted.  Mild right-sided weakness noted.    Consultants:  None  Procedures:  Antimicrobials:  Ceftriaxone Zithromax  Data Reviewed: I have personally reviewed following labs and imaging studies  CBC: Recent Labs  Lab 01/12/21 0002 01/12/21 1353 01/13/21 0636  WBC 8.5 7.9 9.9  NEUTROABS 6.8  --   --   HGB 10.8* 11.0* 10.2*  HCT 35.4* 34.1* 30.9*  MCV 95.9 94.2 91.7  PLT 374 366 [id number]    Basic Metabolic Panel: Recent Labs  Lab 01/12/21 0002 01/12/21 1353 01/13/21 0636  NA 139 138 135  K 4.3 3.9 3.6  CL 103 101 97*  CO2 '26 26 28  '$ GLUCOSE 131* 126* 122*  BUN '19 16 17  '$ CREATININE 1.07* 0.92 0.85  CALCIUM 9.0 8.6* 8.1*    GFR: Estimated Creatinine Clearance: 48.4 mL/min (by C-G formula based on SCr of 0.85 mg/dL). Liver Function Tests: Recent Labs  Lab 01/12/21 0002  01/13/21 0636  AST 17 16  ALT 22 19  ALKPHOS 61 55  BILITOT 0.5 1.0  PROT 6.7 6.8  ALBUMIN 3.0* 3.0*    Coagulation Profile: Recent Labs  Lab 01/12/21 0002  INR 1.0    Cardiac Enzymes: Recent Labs  Lab 01/12/21 0002  CKTOTAL 31*    CBG: Recent Labs  Lab 01/13/21 1244 01/13/21 1739 01/13/21 2114 01/14/21 0739 01/14/21 1158  GLUCAP 139* 209* 116* 114* 136*    Anemia Panel: Recent Labs    01/13/21 0636  VITAMINB12 154*    Sepsis Labs: Recent Labs  Lab 01/12/21 0002 01/12/21 1558 01/12/21 2211 01/13/21 0636  PROCALCITON  --   --   --  <0.10  LATICACIDVEN 1.4 0.7 0.9  --      Recent Results (  from the past 240 hour(s))  Urine Culture     Status: None   Collection Time: 01/11/21 11:41 PM   Specimen: In/Out Cath Urine  Result Value Ref Range Status   Specimen Description IN/OUT CATH URINE  Final   Special Requests NONE  Final   Culture   Final    NO GROWTH Performed at [facility], [address], [place], [place] [address]    Report Status 01/13/2021 FINAL  Final  Resp Panel by RT-PCR (Flu A&B, Covid) Nasopharyngeal Swab     Status: None   Collection Time: 01/12/21 12:02 AM   Specimen: Nasopharyngeal Swab; Nasopharyngeal(NP) swabs in vial transport medium  Result Value Ref Range Status   SARS Coronavirus 2 by RT PCR NEGATIVE NEGATIVE Final    Comment: (NOTE) SARS-CoV-2 target nucleic acids are NOT DETECTED.  The SARS-CoV-2 RNA is generally detectable in upper respiratory specimens during the acute phase of infection. The lowest concentration of SARS-CoV-2 viral copies this assay can detect is 138 copies/mL. A negative result does not preclude SARS-Cov-2 infection and should not be used as the sole basis for treatment or other patient management decisions. A negative result may occur with  improper specimen collection/handling, submission of specimen other than nasopharyngeal swab, presence of viral mutation(s) within the areas targeted  by this assay, and inadequate number of viral copies(<138 copies/mL). A negative result must be combined with clinical observations, patient history, and epidemiological information. The expected result is Negative.  Fact Sheet for Patients:  EntrepreneurPulse.com.au  Fact Sheet for Healthcare Providers:  IncredibleEmployment.be  This test is no t yet approved or cleared by the [place] FDA and  has been authorized for detection and/or diagnosis of SARS-CoV-2 by FDA under an Emergency Use Authorization (EUA). This EUA will remain  in effect (meaning this test can be used) for the duration of the COVID-19 declaration under Section 564(b)(1) of the Act, 21 U.S.C.section 360bbb-3(b)(1), unless the authorization is terminated  or revoked sooner.       Influenza A by PCR NEGATIVE NEGATIVE Final   Influenza B by PCR NEGATIVE NEGATIVE Final    Comment: (NOTE) The Xpert Xpress SARS-CoV-2/FLU/RSV plus assay is intended as an aid in the diagnosis of influenza from Nasopharyngeal swab specimens and should not be used as a sole basis for treatment. Nasal washings and aspirates are unacceptable for Xpert Xpress SARS-CoV-2/FLU/RSV testing.  Fact Sheet for Patients: EntrepreneurPulse.com.au  Fact Sheet for Healthcare Providers: IncredibleEmployment.be  This test is not yet approved or cleared by the [place] FDA and has been authorized for detection and/or diagnosis of SARS-CoV-2 by FDA under an Emergency Use Authorization (EUA). This EUA will remain in effect (meaning this test can be used) for the duration of the COVID-19 declaration under Section 564(b)(1) of the Act, 21 U.S.C. section 360bbb-3(b)(1), unless the authorization is terminated or revoked.  Performed at [facility], [place] [address] [place], [place] [address]   Blood Culture (routine x 2)     Status: None (Preliminary result)    Collection Time: 01/12/21 12:02 AM   Specimen: BLOOD  Result Value Ref Range Status   Specimen Description BLOOD RIGHT ANTECUBITAL  Final   Special Requests   Final    BOTTLES DRAWN AEROBIC AND ANAEROBIC Blood Culture adequate volume   Culture   Final    NO GROWTH 2 DAYS Performed at [facility], [place] [address] [place],  [address]    Report Status PENDING  Incomplete  Blood Culture (routine x 2)     Status: None (Preliminary result)   Collection Time: 01/12/21 12:07 AM   Specimen: BLOOD  Result Value Ref Range Status   Specimen Description BLOOD SITE NOT SPECIFIED  Final   Special Requests   Final    BOTTLES DRAWN AEROBIC AND ANAEROBIC Blood Culture results may not be optimal due to an inadequate volume of blood received in culture bottles   Culture   Final    NO GROWTH 2 DAYS Performed at [facility], [place] [address] [place], [place] [address]    Report Status PENDING  Incomplete  Resp Panel by RT-PCR (Flu A&B, Covid) Nasopharyngeal Swab     Status: None   Collection Time: 01/12/21  1:53 PM   Specimen: Nasopharyngeal Swab; Nasopharyngeal(NP) swabs in vial transport medium  Result Value Ref Range Status   SARS Coronavirus 2 by RT PCR NEGATIVE NEGATIVE Final    Comment: (NOTE) SARS-CoV-2 target nucleic acids are NOT DETECTED.  The SARS-CoV-2 RNA is generally detectable in upper respiratory specimens during the acute phase of infection. The lowest concentration of SARS-CoV-2 viral copies this assay can detect is 138 copies/mL. A negative result does not preclude SARS-Cov-2 infection and should not be used as the sole basis for treatment or other patient management decisions. A negative result may occur with  improper specimen collection/handling, submission of specimen other than nasopharyngeal swab, presence of viral mutation(s) within the areas targeted by this assay, and inadequate number of viral copies(<138 copies/mL). A negative result must be  combined with clinical observations, patient history, and epidemiological information. The expected result is Negative.  Fact Sheet for Patients:  EntrepreneurPulse.com.au  Fact Sheet for Healthcare Providers:  IncredibleEmployment.be  This test is no t yet approved or cleared by the [place] FDA and  has been authorized for detection and/or diagnosis of SARS-CoV-2 by FDA under an Emergency Use Authorization (EUA). This EUA will remain  in effect (meaning this test can be used) for the duration of the COVID-19 declaration under Section 564(b)(1) of the Act, 21 U.S.C.section 360bbb-3(b)(1), unless the authorization is terminated  or revoked sooner.       Influenza A by PCR NEGATIVE NEGATIVE Final   Influenza B by PCR NEGATIVE NEGATIVE Final    Comment: (NOTE) The Xpert Xpress SARS-CoV-2/FLU/RSV plus assay is intended as an aid in the diagnosis of influenza from Nasopharyngeal swab specimens and should not be used as a sole basis for treatment. Nasal washings and aspirates are unacceptable for Xpert Xpress SARS-CoV-2/FLU/RSV testing.  Fact Sheet for Patients: EntrepreneurPulse.com.au  Fact Sheet for Healthcare Providers: IncredibleEmployment.be  This test is not yet approved or cleared by the [place] FDA and has been authorized for detection and/or diagnosis of SARS-CoV-2 by FDA under an Emergency Use Authorization (EUA). This EUA will remain in effect (meaning this test can be used) for the duration of the COVID-19 declaration under Section 564(b)(1) of the Act, 21 U.S.C. section 360bbb-3(b)(1), unless the authorization is terminated or revoked.  Performed at [facility], [address] [place], [place] [address]   Blood culture (routine x 2)     Status: None (Preliminary result)   Collection Time: 01/12/21  3:58 PM   Specimen: BLOOD  Result Value Ref Range Status   Specimen  Description BLOOD BLOOD LEFT WRIST  Final   Special Requests   Final    BOTTLES DRAWN AEROBIC AND ANAEROBIC Blood Culture adequate volume   Culture  Final    NO GROWTH 2 DAYS Performed at [facility], [address] [place], [place] [address]    Report Status PENDING  Incomplete  Blood culture (routine x 2)     Status: None (Preliminary result)   Collection Time: 01/12/21  3:58 PM   Specimen: BLOOD  Result Value Ref Range Status   Specimen Description BLOOD LEFT ANTECUBITAL  Final   Special Requests   Final    BOTTLES DRAWN AEROBIC AND ANAEROBIC Blood Culture adequate volume   Culture   Final    NO GROWTH 2 DAYS Performed at [facility], [address], [place],  [address]    Report Status PENDING  Incomplete      Radiology Studies: DG Chest Portable 1 View  Result Date: 01/12/2021 CLINICAL DATA:  Fever EXAM: PORTABLE CHEST 1 VIEW COMPARISON:  Chest radiograph 01/13/2011 FINDINGS: The heart is enlarged, unchanged. The mediastinal contours are stable. There are patchy interstitial markings throughout both lungs. There is increased density in the right base with a new small right pleural effusion. There is no significant left effusion. There is no pneumothorax. There is no acute osseous abnormality. IMPRESSION: 1. Increased interstitial markings throughout both lungs may reflect pulmonary edema, viral infection, or combination. 2. Increased conspicuity of a small right pleural effusion with adjacent airspace disease which could reflect pneumonia in the correct clinical setting. 3. Cardiomegaly. Electronically Signed   By: Valetta Mole M.D.   On: 01/12/2021 16:05   ECHOCARDIOGRAM COMPLETE  Result Date: 01/13/2021    ECHOCARDIOGRAM REPORT   Patient Name:   Beth Franco Date of Exam: 01/13/2021 Medical Rec #:  [id number]    Height:       65.0 in Accession #:    [id number]   Weight:       149.0 lb Date of Birth:  01-30-1935    BSA:          1.746 m Patient Age:     48 years     BP:           161/52 mmHg Patient Gender: F            HR:           101 bpm. Exam Location:  [facility] Procedure: 2D Echo, Color Doppler, Cardiac Doppler and Intracardiac            Opacification Agent Indications:     R06.00 Dyspnea  History:         Patient has no prior history of Echocardiogram examinations.                  CAD, Stroke and COPD; Risk Factors:Hypertension, Diabetes and                  HCL.  Sonographer:     Charmayne Sheer Referring Phys:  [id number] Aline August Diagnosing Phys: Nelva Bush MD  Sonographer Comments: Technically difficult study due to poor echo windows. IMPRESSIONS  1. Left ventricular ejection fraction, by estimation, is 55 to 60%. The left ventricle has normal function. Left ventricular endocardial border not optimally defined to evaluate regional wall motion. The left ventricular internal cavity size was mildly dilated. There is mild left ventricular hypertrophy. Left ventricular diastolic parameters are consistent with Grade I diastolic dysfunction (impaired relaxation). Elevated left atrial pressure.  2. Right ventricular systolic function is normal. The right ventricular size is normal. Tricuspid regurgitation signal is inadequate for assessing PA pressure.  3. The mitral  valve was not well visualized. No evidence of mitral valve regurgitation. No evidence of mitral stenosis.  4. The aortic valve has an indeterminant number of cusps. There is moderate calcification of the aortic valve. There is moderate thickening of the aortic valve. Aortic valve regurgitation is not visualized. Moderate to severe aortic valve stenosis. Aortic valve area, by VTI measures 1.11 cm. Aortic valve mean gradient measures 14.0 mmHg.  5. The inferior vena cava is normal in size with <50% respiratory variability, suggesting right atrial pressure of 8 mmHg. FINDINGS  Left Ventricle: Left ventricular ejection fraction, by estimation, is 55 to 60%. The left ventricle has normal function. Left  ventricular endocardial border not optimally defined to evaluate regional wall motion. Definity contrast agent was given IV to delineate the left ventricular endocardial borders. The left ventricular internal cavity size was mildly dilated. There is mild left ventricular hypertrophy. Left ventricular diastolic parameters are consistent with Grade I diastolic dysfunction (impaired relaxation). Elevated left atrial pressure. Right Ventricle: The right ventricular size is normal. No increase in right ventricular wall thickness. Right ventricular systolic function is normal. Tricuspid regurgitation signal is inadequate for assessing PA pressure. Left Atrium: Left atrial size was normal in size. Right Atrium: Right atrial size was normal in size. Pericardium: There is no evidence of pericardial effusion. Mitral Valve: The mitral valve was not well visualized. There is mild thickening of the mitral valve leaflet(s). There is mild calcification of the mitral valve leaflet(s). Mild to moderate mitral annular calcification. No evidence of mitral valve regurgitation. No evidence of mitral valve stenosis. MV peak gradient, 3.7 mmHg. The mean mitral valve gradient is 1.0 mmHg. Tricuspid Valve: The tricuspid valve is not well visualized. Tricuspid valve regurgitation is trivial. Aortic Valve: The aortic valve has an indeterminant number of cusps. There is moderate calcification of the aortic valve. There is moderate thickening of the aortic valve. Aortic valve regurgitation is not visualized. Moderate to severe aortic stenosis is present. Aortic valve mean gradient measures 14.0 mmHg. Aortic valve peak gradient measures 22.7 mmHg. Aortic valve area, by VTI measures 1.11 cm. Pulmonic Valve: The pulmonic valve was not well visualized. Pulmonic valve regurgitation is not visualized. No evidence of pulmonic stenosis. Aorta: The aortic root is normal in size and structure. Pulmonary Artery: The pulmonary artery is of normal size.  Venous: The inferior vena cava is normal in size with less than 50% respiratory variability, suggesting right atrial pressure of 8 mmHg. IAS/Shunts: The interatrial septum was not well visualized.  LEFT VENTRICLE PLAX 2D LVIDd:         5.20 cm     Diastology LVIDs:         4.60 cm     LV e' medial:    3.48 cm/s LV PW:         1.10 cm     LV E/e' medial:  24.6 LV IVS:        0.80 cm     LV e' lateral:   6.53 cm/s LVOT diam:     2.10 cm     LV E/e' lateral: 13.1 LV SV:         50 LV SV Index:   29 LVOT Area:     3.46 cm  LV Volumes (MOD) LV vol d, MOD A2C: 84.7 ml LV vol d, MOD A4C: 84.4 ml LV vol s, MOD A2C: 57.0 ml LV vol s, MOD A4C: 56.4 ml LV SV MOD A2C:     27.7 ml LV  SV MOD A4C:     84.4 ml LV SV MOD BP:      30.0 ml RIGHT VENTRICLE RV Basal diam:  3.20 cm LEFT ATRIUM             Index       RIGHT ATRIUM           Index LA diam:        3.60 cm 2.06 cm/m  RA Area:     17.70 cm LA Vol (A2C):   51.3 ml 29.39 ml/m RA Volume:   49.10 ml  28.13 ml/m LA Vol (A4C):   48.5 ml 27.78 ml/m LA Biplane Vol: 50.1 ml 28.70 ml/m  AORTIC VALVE                    PULMONIC VALVE AV Area (Vmax):    1.23 cm     PV Vmax:       1.03 m/s AV Area (Vmean):   1.12 cm     PV Vmean:      79.700 cm/s AV Area (VTI):     1.11 cm     PV VTI:        0.209 m AV Vmax:           238.00 cm/s  PV Peak grad:  4.2 mmHg AV Vmean:          174.500 cm/s PV Mean grad:  3.0 mmHg AV VTI:            0.450 m AV Peak Grad:      22.7 mmHg AV Mean Grad:      14.0 mmHg LVOT Vmax:         84.20 cm/s LVOT Vmean:        56.200 cm/s LVOT VTI:          0.144 m LVOT/AV VTI ratio: 0.32  AORTA Ao Root diam: 3.20 cm MITRAL VALVE MV Area (PHT): 3.79 cm    SHUNTS MV Area VTI:   2.45 cm    Systemic VTI:  0.14 m MV Peak grad:  3.7 mmHg    Systemic Diam: 2.10 cm MV Mean grad:  1.0 mmHg MV Vmax:       0.97 m/s MV Vmean:      50.5 cm/s MV Decel Time: 200 msec MV E velocity: 85.70 cm/s MV A velocity: 95.10 cm/s MV E/A ratio:  0.90 Christopher End MD Electronically  signed by Nelva Bush MD Signature Date/Time: 01/13/2021/1:45:20 PM    Final     Scheduled Meds:  chlorhexidine  15 mL Mouth Rinse BID   clopidogrel  75 mg Oral Daily   enoxaparin (LOVENOX) injection  40 mg Subcutaneous Q24H   feeding supplement (GLUCERNA SHAKE)  237 mL Oral TID BM   ferrous sulfate  325 mg Oral Q breakfast   furosemide  40 mg Intravenous Daily   insulin aspart  0-15 Units Subcutaneous TID WC   insulin aspart  0-5 Units Subcutaneous QHS   losartan  100 mg Oral Daily   mouth rinse  15 mL Mouth Rinse q12n4p   melatonin  2.5 mg Oral QHS   metoprolol tartrate  50 mg Oral BID   pravastatin  40 mg Oral q1800   sodium chloride flush  3 mL Intravenous Q12H   Continuous Infusions:  azithromycin Stopped (01/13/21 1744)   cefTRIAXone (ROCEPHIN)  IV Stopped (01/13/21 1841)     LOS: 1 day    Bonnielee Haff, MD [facility]  If 7PM-7AM, please contact night-coverage [email or web address]  01/14/2021, 1:14 PM

## 2021-01-14 NOTE — Progress Notes (Addendum)
Mobility Specialist - Progress Note   01/14/21 1000  Mobility  Activity Turned to right side;Turned to left side  Range of Motion/Exercises Right leg;Left leg  Level of Assistance Minimal assist, patient does 75% or more  Distance Ambulated (ft) 0 ft  Mobility Sit up in bed/chair position for meals  Mobility Response Tolerated well  Mobility performed by Mobility specialist  $Mobility charge 1 Mobility    Pre-mobility: 68 HR, 95% SpO2 Post-mobility: 69 HR, 93% SpO2   Pt finishing up on bedpan on arrival, BM. Utilizing 3L HFNC. Turned to L/R side for peri-care. Participated in bed-level therex----independent on LLE, AAROM on RLE. No complaints. "It just doesn't work."  Repositioned to [facility] for comfort. Pt left in bed with alarm set, needs in reach.    Kathee Delton Mobility Specialist 01/14/21, 10:21 AM

## 2021-01-14 NOTE — Progress Notes (Signed)
Education provided on importance of oral care to reduce aspiration and infection. Patient refused to brush teeth and states she wants to rest and will brush them in the morning. Toothbrush, toothpaste, and mouth wash left at bedside with encouragement to brush before the morning if possible. Erling Conte, RN

## 2021-01-14 NOTE — TOC Initial Note (Addendum)
Transition of Care [facility]) - Initial/Assessment Note    Patient Details  Name: Beth Franco MRN: [id number] Date of Birth: 03/10/1935  Transition of Care [facility]) CM/SW Contact:    Alberteen Sam, LCSW Phone Number: 01/14/2021, 9:45 AM  Clinical Narrative:                  Update: [name] called back, updated on SNF bed from [facility] as only bed offer at this time. Reports she will research and let CSW know if they accept.       Patient's daughter [name] contacted regarding SNF, agreeable for CSW to send out referrals except to [facility].   CSW has called back daughter [name] to inform of bed offer of [facility], no answer lvm.   Pending call back at this time.     Expected Discharge Plan: Skilled Nursing Facility Barriers to Discharge: Continued Medical Work up   Patient Goals and CMS Choice   CMS Medicare.gov Compare Post Acute Care list provided to:: Patient Represenative (must comment) (daughter [name]) Choice offered to / list presented to : Adult Children  Expected Discharge Plan and Services Expected Discharge Plan: [place] Acute Care Choice: [place] Living arrangements for the past 2 months: Single Family Home                                      Prior Living Arrangements/Services Living arrangements for the past 2 months: Single Family Home Lives with:: Relatives Patient language and need for interpreter reviewed:: Yes Do you feel safe going back to the place where you live?: Yes      Need for Family Participation in Patient Care: Yes (Comment) Care giver support system in place?: Yes (comment)   Criminal Activity/Legal Involvement Pertinent to Current Situation/Hospitalization: No - Comment as needed  Activities of Daily Living      Permission Sought/Granted Permission sought to share information with : Case Manager, Customer service manager, Family Supports Permission granted to share  information with : Yes, Verbal Permission Granted  Share Information with NAME: [name]  Permission granted to share info w AGENCY: SNFs  Permission granted to share info w Relationship: daughter  Permission granted to share info w Contact Information: [phone]  Emotional Assessment Appearance:: Appears stated age       Alcohol / Substance Use: Not Applicable Psych Involvement: No (comment)  Admission diagnosis:  Pneumonia [J18.9] Weakness [R53.1] Pneumonia due to infectious organism, unspecified laterality, unspecified part of lung [J18.9] Patient Active Problem List   Diagnosis Date Noted   Weakness    Pneumonia 01/12/2021   Acute lower UTI 01/12/2021   HTN (hypertension) 01/12/2021   Type 2 diabetes mellitus with hyperlipidemia ([place]) 01/12/2021   PCP:  Juline Patch, MD Pharmacy:   [facility] [address], [place] [address] Phone: [phone] Fax: [phone]     Social Determinants of Health (SDOH) Interventions    Readmission Risk Interventions No flowsheet data found.

## 2021-01-15 DIAGNOSIS — I10 Essential (primary) hypertension: Secondary | ICD-10-CM | POA: Diagnosis not present

## 2021-01-15 DIAGNOSIS — E538 Deficiency of other specified B group vitamins: Secondary | ICD-10-CM | POA: Diagnosis not present

## 2021-01-15 DIAGNOSIS — J189 Pneumonia, unspecified organism: Secondary | ICD-10-CM | POA: Diagnosis not present

## 2021-01-15 DIAGNOSIS — E1169 Type 2 diabetes mellitus with other specified complication: Secondary | ICD-10-CM | POA: Diagnosis not present

## 2021-01-15 LAB — GLUCOSE, CAPILLARY
Glucose-Capillary: 140 mg/dL — ABNORMAL HIGH (ref 70–99)
Glucose-Capillary: 203 mg/dL — ABNORMAL HIGH (ref 70–99)
Glucose-Capillary: 119 mg/dL — ABNORMAL HIGH (ref 70–99)
Glucose-Capillary: 138 mg/dL — ABNORMAL HIGH (ref 70–99)

## 2021-01-15 LAB — CBC
HCT: 32.2 % — ABNORMAL LOW (ref 36.0–46.0)
Hemoglobin: 10.2 g/dL — ABNORMAL LOW (ref 12.0–15.0)
MCH: 29 pg (ref 26.0–34.0)
MCHC: 31.7 g/dL (ref 30.0–36.0)
MCV: 91.5 fL (ref 80.0–100.0)
Platelets: 370 10*3/uL (ref 150–400)
RBC: 3.52 MIL/uL — ABNORMAL LOW (ref 3.87–5.11)
RDW: 14.6 % (ref 11.5–15.5)
WBC: 8.8 10*3/uL (ref 4.0–10.5)
nRBC: 0 % (ref 0.0–0.2)

## 2021-01-15 LAB — BASIC METABOLIC PANEL
Anion gap: 10 (ref 5–15)
BUN: 32 mg/dL — ABNORMAL HIGH (ref 8–23)
CO2: 28 mmol/L (ref 22–32)
Calcium: 7.9 mg/dL — ABNORMAL LOW (ref 8.9–10.3)
Chloride: 100 mmol/L (ref 98–111)
Creatinine, Ser: 0.85 mg/dL (ref 0.44–1.00)
GFR, Estimated: >60 mL/min (ref >60)
Glucose, Bld: 184 mg/dL — ABNORMAL HIGH (ref 70–99)
Potassium: 3.8 mmol/L (ref 3.5–5.1)
Sodium: 138 mmol/L (ref 135–145)

## 2021-01-15 LAB — RETICULOCYTES
Immature Retic Fract: 17.2 % — ABNORMAL HIGH (ref 2.3–15.9)
RBC.: 3.51 MIL/uL — ABNORMAL LOW (ref 3.87–5.11)
Retic Count, Absolute: 47 10*3/uL (ref 19.0–186.0)
Retic Ct Pct: 1.3 % (ref 0.4–3.1)

## 2021-01-15 LAB — IRON AND TIBC
Iron: 12 ug/dL — ABNORMAL LOW (ref 28–170)
Saturation Ratios: 5 % — ABNORMAL LOW (ref 10.4–31.8)
TIBC: 223 ug/dL — ABNORMAL LOW (ref 250–450)
UIBC: 211 ug/dL

## 2021-01-15 LAB — FERRITIN: Ferritin: 89 ng/mL (ref 11–307)

## 2021-01-15 LAB — MAGNESIUM: Magnesium: 2.1 mg/dL (ref 1.7–2.4)

## 2021-01-15 LAB — PROCALCITONIN: Procalcitonin: <0.1 ng/mL

## 2021-01-15 LAB — FOLATE: Folate: 30 ng/mL (ref >5.9)

## 2021-01-15 MED ORDER — FUROSEMIDE 40 MG PO TABS
40.0000 mg | ORAL_TABLET | Freq: Every day | ORAL | Status: DC
  Administered 2021-01-16 – 2021-01-19 (×4): 40 mg via ORAL
  Filled 2021-01-15 (×4): qty 1

## 2021-01-15 MED ORDER — AZITHROMYCIN 250 MG PO TABS
500.0000 mg | ORAL_TABLET | Freq: Every day | ORAL | Status: AC
  Administered 2021-01-15 – 2021-01-16 (×2): 500 mg via ORAL
  Filled 2021-01-15 (×2): qty 2

## 2021-01-15 MED ORDER — CEFDINIR 300 MG PO CAPS
300.0000 mg | ORAL_CAPSULE | Freq: Two times a day (BID) | ORAL | Status: AC
  Administered 2021-01-15 – 2021-01-17 (×6): 300 mg via ORAL
  Filled 2021-01-15 (×6): qty 1

## 2021-01-15 NOTE — Progress Notes (Signed)
PROGRESS NOTE    Beth Franco  [id number] DOB: 13-May-1934 DOA: 01/12/2021 PCP: Juline Patch, MD   Brief Narrative: Taken from H&P. Beth Franco is a 86 y.o. female with medical history significant of  HTN, DM type II, COPD, chronic respiratory failure with hypoxia requiring 2 to 3 L oxygen via nasal cannula at night only, CAD S/P stent, unspecified stroke with residual right lower extremity weakness and dyslipidiemia presented with worsening generalized weakness, inability to ambulate and falls.  She had 2 falls over 2 days.  At baseline patient has underlying dementia, lives with her daughter, able to move around with walker, able to perform simple ADLs and rest daughter helps.   Urine culture done on 01/11/2021 during initial ED visit was negative.  Blood cultures negative so far. COVID-19 and influenza PCR negative. CT head and cervical spine was without any acute abnormality. Chest x-ray with increased interstitial markings reflecting pulmonary edema, viral infection or combination along with right pleural effusion with adjacent airspace disease which could reflect pneumonia.  She was started on Rocephin and Zithromax.  Procalcitonin negative but she continued to spike fever.     Subjective: Patient mentions that her cough is improving.  Feels a little bit stronger today.  No nausea vomiting.  Denies any chest pain.     Assessment & Plan:   Community-acquired pneumonia Initial films were not conclusive however repeat x-ray raised concern for viral pneumonia versus pulmonary edema.  Patient was placed on ceftriaxone and azithromycin.  Also placed on furosemide due to concern for pulmonary edema and elevated BNP.   WBC was noted to be normal.  Lactic acid level was normal.  COVID-19 and influenza PCR's were negative.  Procalcitonin was less than 0.1.   Echocardiogram showed normal systolic function with grade 1 diastolic dysfunction. Patient had a fever with temperature of 101.6 on  the afternoon of 9/13.  Has been afebrile since then. Due to concern for secondary bacterial infection and she was continued on antibacterials.  Seems to be doing better today.  We will change her to oral antibiotics. Seen by speech therapy.  Aspiration precautions but no clear dysphagia noted on examination.  Remains on mechanical soft diet consistency.  Chronic respiratory failure with COPD   Patient is lifelong heavy smoker.  Uses 2 to 3 L of oxygen at night most of the time.  Continue with supplemental oxygen.  Occasional wheezes appreciated.  Bronchodilators as needed.   Seems to be better.  Acute on chronic HFpEF Echocardiogram today with normal EF, grade 1 diastolic dysfunction and moderate to severe aortic stenosis.  BNP elevated at 1111.  Chest x-ray with concern of pulmonary edema.  She was started on IV Lasix.  Changed to oral diuretics.  New strict ins and outs and daily weights.  Elevated troponin 247>>249.  Most likely secondary to demand ischemia.  Flat curve. No chest pain.    Do not anticipate any further testing at this time.  Type 2 diabetes mellitus.   Monitor CBGs.  Continue SSI.    Normocytic anemia/B12 deficiency B12 noted to be 154.  Will start supplementation.  Give intramuscularly while she is here in the hospital.  Ferritin noted to be 89.  TIBC 223.  Folate 30.0.  Essential hypertension Remains on losartan and metoprolol.  Blood pressure is reasonably well controlled.  Previous history of stroke with right-sided weakness Stable.  PT and OT evaluation.  Continue Plavix.  Dyslipidemia. -Continue statin  Generalized weakness associated with falls.  No acute injury -PT is recommending SNF -TOC consult  DVT prophylaxis: Lovenox Code Status: DNR Family Communication: Daughter was updated yesterday. Disposition Plan: SNF when improved  Status is: Inpatient  Remains inpatient appropriate because:Inpatient level of care appropriate due to severity of  illness  Dispo: The patient is from: Home              Anticipated d/c is to: SNF              Patient currently is not medically stable to d/c.   Difficult to place patient No              Level of care: Med-Surg   Objective: Vitals:   01/15/21 0411 01/15/21 0500 01/15/21 0734 01/15/21 1118  BP: 138/72  (!) 129/54 116/60  Pulse: 80  89 69  Resp: '18  18 18  '$ Temp: 99.6 F (37.6 C)  98.5 F (36.9 C) 98.6 F (37 C)  TempSrc:   Oral Oral  SpO2: 99%  93% 97%  Weight:  68.8 kg    Height:        Intake/Output Summary (Last 24 hours) at 01/15/2021 1226 Last data filed at 01/15/2021 1118 Gross per 24 hour  Intake 950 ml  Output 850 ml  Net 100 ml    Filed Weights   01/14/21 0423 01/14/21 0646 01/15/21 0500  Weight: 70 kg 70 kg 68.8 kg    Examination:  General appearance: Awake alert.  In no distress Resp: Improved effort.  No wheezing appreciated today.  Few crackles at the bases. Cardio: S1-S2 is normal regular.  No S3-S4.  No rubs murmurs or bruit GI: Abdomen is soft.  Nontender nondistended.  Bowel sounds are present normal.  No masses organomegaly Extremities: No edema.  Neurologic: No focal neurological deficits.     Consultants:  None  Procedures:  Antimicrobials:  Ceftriaxone Zithromax  Data Reviewed: I have personally reviewed following labs and imaging studies  CBC: Recent Labs  Lab 01/12/21 0002 01/12/21 1353 01/13/21 0636 01/15/21 0839  WBC 8.5 7.9 9.9 8.8  NEUTROABS 6.8  --   --   --   HGB 10.8* 11.0* 10.2* 10.2*  HCT 35.4* 34.1* 30.9* 32.2*  MCV 95.9 94.2 91.7 91.5  PLT 374 366 336 0000000    Basic Metabolic Panel: Recent Labs  Lab 01/12/21 0002 01/12/21 1353 01/13/21 0636 01/15/21 0839  NA 139 138 135 138  K 4.3 3.9 3.6 3.8  CL 103 101 97* 100  CO2 '26 26 28 28  '$ GLUCOSE 131* 126* 122* 184*  BUN '19 16 17 '$ 32*  CREATININE 1.07* 0.92 0.85 0.85  CALCIUM 9.0 8.6* 8.1* 7.9*  MG  --   --   --  2.1    GFR: Estimated Creatinine  Clearance: 48 mL/min (by C-G formula based on SCr of 0.85 mg/dL). Liver Function Tests: Recent Labs  Lab 01/12/21 0002 01/13/21 0636  AST 17 16  ALT 22 19  ALKPHOS 61 55  BILITOT 0.5 1.0  PROT 6.7 6.8  ALBUMIN 3.0* 3.0*    Coagulation Profile: Recent Labs  Lab 01/12/21 0002  INR 1.0    Cardiac Enzymes: Recent Labs  Lab 01/12/21 0002  CKTOTAL 31*    CBG: Recent Labs  Lab 01/14/21 1158 01/14/21 1620 01/14/21 2212 01/15/21 0735 01/15/21 1135  GLUCAP 136* 155* 132* 140* 203*    Anemia Panel: Recent Labs    01/13/21 0636 01/15/21 0839  VITAMINB12 154*  --   FOLATE  --  30.0  FERRITIN  --  89  TIBC  --  223*  IRON  --  12*  RETICCTPCT  --  1.3    Sepsis Labs: Recent Labs  Lab 01/12/21 0002 01/12/21 1558 01/12/21 2211 01/13/21 0636 01/14/21 0817  PROCALCITON  --   --   --  <0.10 <0.10  LATICACIDVEN 1.4 0.7 0.9  --   --      Recent Results (from the past 240 hour(s))  Urine Culture     Status: None   Collection Time: 01/11/21 11:41 PM   Specimen: In/Out Cath Urine  Result Value Ref Range Status   Specimen Description IN/OUT CATH URINE  Final   Special Requests NONE  Final   Culture   Final    NO GROWTH Performed at [facility], [place] [address], [place], [place] [address]    Report Status 01/13/2021 FINAL  Final  Resp Panel by RT-PCR (Flu A&B, Covid) Nasopharyngeal Swab     Status: None   Collection Time: 01/12/21 12:02 AM   Specimen: Nasopharyngeal Swab; Nasopharyngeal(NP) swabs in vial transport medium  Result Value Ref Range Status   SARS Coronavirus 2 by RT PCR NEGATIVE NEGATIVE Final    Comment: (NOTE) SARS-CoV-2 target nucleic acids are NOT DETECTED.  The SARS-CoV-2 RNA is generally detectable in upper respiratory specimens during the acute phase of infection. The lowest concentration of SARS-CoV-2 viral copies this assay can detect is 138 copies/mL. A negative result does not preclude SARS-Cov-2 infection and should not be  used as the sole basis for treatment or other patient management decisions. A negative result may occur with  improper specimen collection/handling, submission of specimen other than nasopharyngeal swab, presence of viral mutation(s) within the areas targeted by this assay, and inadequate number of viral copies(<138 copies/mL). A negative result must be combined with clinical observations, patient history, and epidemiological information. The expected result is Negative.  Fact Sheet for Patients:  EntrepreneurPulse.com.au  Fact Sheet for Healthcare Providers:  IncredibleEmployment.be  This test is no t yet approved or cleared by the [place] FDA and  has been authorized for detection and/or diagnosis of SARS-CoV-2 by FDA under an Emergency Use Authorization (EUA). This EUA will remain  in effect (meaning this test can be used) for the duration of the COVID-19 declaration under Section 564(b)(1) of the Act, 21 U.S.C.section 360bbb-3(b)(1), unless the authorization is terminated  or revoked sooner.       Influenza A by PCR NEGATIVE NEGATIVE Final   Influenza B by PCR NEGATIVE NEGATIVE Final    Comment: (NOTE) The Xpert Xpress SARS-CoV-2/FLU/RSV plus assay is intended as an aid in the diagnosis of influenza from Nasopharyngeal swab specimens and should not be used as a sole basis for treatment. Nasal washings and aspirates are unacceptable for Xpert Xpress SARS-CoV-2/FLU/RSV testing.  Fact Sheet for Patients: EntrepreneurPulse.com.au  Fact Sheet for Healthcare Providers: IncredibleEmployment.be  This test is not yet approved or cleared by the [place] FDA and has been authorized for detection and/or diagnosis of SARS-CoV-2 by FDA under an Emergency Use Authorization (EUA). This EUA will remain in effect (meaning this test can be used) for the duration of the COVID-19 declaration under Section  564(b)(1) of the Act, 21 U.S.C. section 360bbb-3(b)(1), unless the authorization is terminated or revoked.  Performed at [facility], [place] [address], [place],  [address]   Blood Culture (routine x 2)     Status: None (Preliminary result)   Collection Time:  01/12/21 12:02 AM   Specimen: BLOOD  Result Value Ref Range Status   Specimen Description BLOOD RIGHT ANTECUBITAL  Final   Special Requests   Final    BOTTLES DRAWN AEROBIC AND ANAEROBIC Blood Culture adequate volume   Culture   Final    NO GROWTH 3 DAYS Performed at [facility], [address] [place], [place] [address]    Report Status PENDING  Incomplete  Blood Culture (routine x 2)     Status: None (Preliminary result)   Collection Time: 01/12/21 12:07 AM   Specimen: BLOOD  Result Value Ref Range Status   Specimen Description BLOOD SITE NOT SPECIFIED  Final   Special Requests   Final    BOTTLES DRAWN AEROBIC AND ANAEROBIC Blood Culture results may not be optimal due to an inadequate volume of blood received in culture bottles   Culture   Final    NO GROWTH 3 DAYS Performed at [facility], [place] [address], [place], [place] [address]    Report Status PENDING  Incomplete  Resp Panel by RT-PCR (Flu A&B, Covid) Nasopharyngeal Swab     Status: None   Collection Time: 01/12/21  1:53 PM   Specimen: Nasopharyngeal Swab; Nasopharyngeal(NP) swabs in vial transport medium  Result Value Ref Range Status   SARS Coronavirus 2 by RT PCR NEGATIVE NEGATIVE Final    Comment: (NOTE) SARS-CoV-2 target nucleic acids are NOT DETECTED.  The SARS-CoV-2 RNA is generally detectable in upper respiratory specimens during the acute phase of infection. The lowest concentration of SARS-CoV-2 viral copies this assay can detect is 138 copies/mL. A negative result does not preclude SARS-Cov-2 infection and should not be used as the sole basis for treatment or other patient management decisions. A negative result may occur  with  improper specimen collection/handling, submission of specimen other than nasopharyngeal swab, presence of viral mutation(s) within the areas targeted by this assay, and inadequate number of viral copies(<138 copies/mL). A negative result must be combined with clinical observations, patient history, and epidemiological information. The expected result is Negative.  Fact Sheet for Patients:  EntrepreneurPulse.com.au  Fact Sheet for Healthcare Providers:  IncredibleEmployment.be  This test is no t yet approved or cleared by the [place] FDA and  has been authorized for detection and/or diagnosis of SARS-CoV-2 by FDA under an Emergency Use Authorization (EUA). This EUA will remain  in effect (meaning this test can be used) for the duration of the COVID-19 declaration under Section 564(b)(1) of the Act, 21 U.S.C.section 360bbb-3(b)(1), unless the authorization is terminated  or revoked sooner.       Influenza A by PCR NEGATIVE NEGATIVE Final   Influenza B by PCR NEGATIVE NEGATIVE Final    Comment: (NOTE) The Xpert Xpress SARS-CoV-2/FLU/RSV plus assay is intended as an aid in the diagnosis of influenza from Nasopharyngeal swab specimens and should not be used as a sole basis for treatment. Nasal washings and aspirates are unacceptable for Xpert Xpress SARS-CoV-2/FLU/RSV testing.  Fact Sheet for Patients: EntrepreneurPulse.com.au  Fact Sheet for Healthcare Providers: IncredibleEmployment.be  This test is not yet approved or cleared by the [place] FDA and has been authorized for detection and/or diagnosis of SARS-CoV-2 by FDA under an Emergency Use Authorization (EUA). This EUA will remain in effect (meaning this test can be used) for the duration of the COVID-19 declaration under Section 564(b)(1) of the Act, 21 U.S.C. section 360bbb-3(b)(1), unless the authorization is terminated  or revoked.  Performed at [facility], [address] [place], [place] [address]   Blood culture (routine x 2)     Status: None (Preliminary result)   Collection Time: 01/12/21  3:58 PM   Specimen: BLOOD  Result Value Ref Range Status   Specimen Description BLOOD BLOOD LEFT WRIST  Final   Special Requests   Final    BOTTLES DRAWN AEROBIC AND ANAEROBIC Blood Culture adequate volume   Culture   Final    NO GROWTH 3 DAYS Performed at [facility], [address], [place], [place] [address]    Report Status PENDING  Incomplete  Blood culture (routine x 2)     Status: None (Preliminary result)   Collection Time: 01/12/21  3:58 PM   Specimen: BLOOD  Result Value Ref Range Status   Specimen Description BLOOD LEFT ANTECUBITAL  Final   Special Requests   Final    BOTTLES DRAWN AEROBIC AND ANAEROBIC Blood Culture adequate volume   Culture   Final    NO GROWTH 3 DAYS Performed at [facility], [address], [place], [place] [address]    Report Status PENDING  Incomplete      Radiology Studies: No results found.  Scheduled Meds:  chlorhexidine  15 mL Mouth Rinse BID   clopidogrel  75 mg Oral Daily   cyanocobalamin  1,000 mcg Intramuscular Daily   Followed by   Derrill Memo ON 01/19/2021] vitamin B-12  1,000 mcg Oral Daily   enoxaparin (LOVENOX) injection  40 mg Subcutaneous Q24H   feeding supplement (GLUCERNA SHAKE)  237 mL Oral TID BM   ferrous sulfate  325 mg Oral Q breakfast   furosemide  40 mg Intravenous Daily   insulin aspart  0-15 Units Subcutaneous TID WC   insulin aspart  0-5 Units Subcutaneous QHS   losartan  100 mg Oral Daily   mouth rinse  15 mL Mouth Rinse q12n4p   melatonin  2.5 mg Oral QHS   metoprolol tartrate  50 mg Oral BID   pravastatin  40 mg Oral q1800   sodium chloride flush  3 mL Intravenous Q12H   Continuous Infusions:  azithromycin 500 mg (01/14/21 1804)   cefTRIAXone (ROCEPHIN)  IV 2 g (01/14/21 1724)     LOS: 2 days     Bonnielee Haff, MD [facility]  If 7PM-7AM, please contact night-coverage [email or web address]  01/15/2021, 12:26 PM

## 2021-01-15 NOTE — TOC Progression Note (Addendum)
Transition of Care [facility]) - Progression Note    Patient Details  Name: Beth Franco MRN: [id number] Date of Birth: 1934/12/12  Transition of Care [facility]) CM/SW [place], RN Phone Number: 01/15/2021, 9:48 AM  Clinical Narrative:  Attempted to call daughter to discuss [facility] bed offer, no answer, LVM. Daughter returned call receptive to [facility]. Text sent to [facility] to secure bed offer, will start Insurance Authorization. 1:35pm Received a call from [place], [facility] representative that authorization will not be processed due to patient not being medically stable per Attending progress note today. Will need to start Authorization process over when medically stable.    Expected Discharge Plan: [place] Barriers to Discharge: Continued Medical Work up  Expected Discharge Plan and Services Expected Discharge Plan: [place] Choice: [place] arrangements for the past 2 months: Single Family Home                                       Social Determinants of Health (SDOH) Interventions    Readmission Risk Interventions No flowsheet data found.

## 2021-01-15 NOTE — Progress Notes (Signed)
Physical Therapy Treatment Patient Details Name: Beth Franco MRN: [id number] DOB: 1934-10-21 Today's Date: 01/15/2021   History of Present Illness 86 y.o. female with medical history significant of  HTN, DM type II, COPD, chronic respiratory failure with hypoxia requiring 2 to 3 L oxygen via nasal cannula at night only, CAD S/P stent, unspecified stroke with residual right lower extremity weakness and dyslipidiemia presented with worsening generalized weakness, inability to ambulate and falls.    PT Comments    Significant pushing behaviors noted with transition to sitting edge of bed this date, max assist to maintain position and midline orientation.  Dense weakness to R LE (1/5 R hip flexion, 2-/5 in L hip/knee extension); unclear correlation to baseline strength/abilities, though do suspect weakness much greater, as patient reported to have been ambulatory at baseline.  Family not available in room to verify.  Does not appear to have marked weakness to R UE with functional tasks.  Will continue to progress as able with goal of OOB to chair when appropriate and as patient allows.    Recommendations for follow up therapy are one component of a multi-disciplinary discharge planning process, led by the attending physician.  Recommendations may be updated based on patient status, additional functional criteria and insurance authorization.  Follow Up Recommendations  SNF     Equipment Recommendations       Recommendations for Other Services       Precautions / Restrictions Precautions Precautions: Fall Restrictions Weight Bearing Restrictions: No     Mobility  Bed Mobility Overal bed mobility: Needs Assistance Bed Mobility: Supine to Sit;Sit to Supine     Supine to sit: Max assist Sit to supine: Max assist   General bed mobility comments: total assist for R LE, max for L LE, truncal elevation.  Unsupported sitting balance, max assist-heavy pushing towards L    Transfers                     Ambulation/Gait                 Stairs             Wheelchair Mobility    Modified Rankin (Stroke Patients Only)       Balance Overall balance assessment: Needs assistance Sitting-balance support: No upper extremity supported;Feet supported Sitting balance-Leahy Scale: Poor Sitting balance - Comments: heavy pushing towards R, absent awareness or attempts at self-correction       Standing balance comment: unsafe/unable                            Cognition Arousal/Alertness: Awake/alert Behavior During Therapy: WFL for tasks assessed/performed Overall Cognitive Status: No family/caregiver present to determine baseline cognitive functioning                                 General Comments: Limited recall of information and conversation, often repeating self during session      Exercises Other Exercises Other Exercises: Unsupported sitting edge of bed, max assist for sitting balance. Heavy pushing towards R, limited awareness and attempts at self-correction.  Midline orientation in M/L plane improved with L UE propped on pillows, mod assist, but continues to require extensive facilitation at thoracic/lumbar spine for neutral alignment in A/P plane.  Worked to include bilat UE dynamic reahcing, trunk extension and rotation and L ant/lateral weight shifting, mod/max, assist to  further reinforce midline orientation.  Good particpiation, but requires step by step cuing and constant manual assist to maintain alignment. Other Exercises: Encouraged OOB to chair; patient refused despite encouragement.    General Comments        Pertinent Vitals/Pain Pain Assessment: No/denies pain    Home Living                      Prior Function            PT Goals (current goals can now be found in the care plan section) Acute Rehab PT Goals Patient Stated Goal: none stated PT Goal Formulation: With patient Time  For Goal Achievement: 01/27/21 Potential to Achieve Goals: Fair Progress towards PT goals: Progressing toward goals    Frequency    Min 2X/week      PT Plan Current plan remains appropriate    Co-evaluation              AM-PAC PT "6 Clicks" Mobility   Outcome Measure    Help needed moving from lying on your back to sitting on the side of a flat bed without using bedrails?: A Lot Help needed moving to and from a bed to a chair (including a wheelchair)?: A Lot Help needed standing up from a chair using your arms (e.g., wheelchair or bedside chair)?: Total Help needed to walk in hospital room?: Total Help needed climbing 3-5 steps with a railing? : Total 6 Click Score: 7    End of Session   Activity Tolerance: Patient tolerated treatment well Patient left: in bed;with call bell/phone within reach;with bed alarm set Nurse Communication: Mobility status PT Visit Diagnosis: Unsteadiness on feet (R26.81);Other abnormalities of gait and mobility (R26.89);History of falling (Z91.81);Repeated falls (R29.6);Muscle weakness (generalized) (M62.81)     Time: [id number] PT Time Calculation (min) (ACUTE ONLY): 25 min  Charges:  $Therapeutic Activity: 23-37 mins                    Wilburta Milbourn H. Owens Shark, PT, DPT, NCS 01/15/21, 11:41 PM [phone]

## 2021-01-16 DIAGNOSIS — J189 Pneumonia, unspecified organism: Secondary | ICD-10-CM | POA: Diagnosis not present

## 2021-01-16 DIAGNOSIS — E538 Deficiency of other specified B group vitamins: Secondary | ICD-10-CM | POA: Diagnosis not present

## 2021-01-16 DIAGNOSIS — I10 Essential (primary) hypertension: Secondary | ICD-10-CM | POA: Diagnosis not present

## 2021-01-16 DIAGNOSIS — E1169 Type 2 diabetes mellitus with other specified complication: Secondary | ICD-10-CM | POA: Diagnosis not present

## 2021-01-16 LAB — BASIC METABOLIC PANEL
Anion gap: 8 (ref 5–15)
BUN: 38 mg/dL — ABNORMAL HIGH (ref 8–23)
CO2: 31 mmol/L (ref 22–32)
Calcium: 8 mg/dL — ABNORMAL LOW (ref 8.9–10.3)
Chloride: 99 mmol/L (ref 98–111)
Creatinine, Ser: 0.9 mg/dL (ref 0.44–1.00)
GFR, Estimated: >60 mL/min (ref >60)
Glucose, Bld: 124 mg/dL — ABNORMAL HIGH (ref 70–99)
Potassium: 3.4 mmol/L — ABNORMAL LOW (ref 3.5–5.1)
Sodium: 138 mmol/L (ref 135–145)

## 2021-01-16 LAB — GLUCOSE, CAPILLARY
Glucose-Capillary: 132 mg/dL — ABNORMAL HIGH (ref 70–99)
Glucose-Capillary: 151 mg/dL — ABNORMAL HIGH (ref 70–99)
Glucose-Capillary: 106 mg/dL — ABNORMAL HIGH (ref 70–99)
Glucose-Capillary: 158 mg/dL — ABNORMAL HIGH (ref 70–99)

## 2021-01-16 MED ORDER — POTASSIUM CHLORIDE CRYS ER 20 MEQ PO TBCR
40.0000 meq | EXTENDED_RELEASE_TABLET | Freq: Once | ORAL | Status: AC
  Administered 2021-01-16: 40 meq via ORAL
  Filled 2021-01-16: qty 4

## 2021-01-16 MED ORDER — POTASSIUM CHLORIDE CRYS ER 20 MEQ PO TBCR
20.0000 meq | EXTENDED_RELEASE_TABLET | Freq: Every day | ORAL | Status: DC
  Administered 2021-01-17 – 2021-01-19 (×3): 20 meq via ORAL
  Filled 2021-01-16 (×3): qty 1

## 2021-01-16 NOTE — Care Management Important Message (Signed)
Important Message  Patient Details  Name: Beth Franco MRN: [id number] Date of Birth: [birth year]   Medicare Important Message Given:  Yes     Dannette Barbara 01/16/2021, 2:50 PM

## 2021-01-16 NOTE — Progress Notes (Signed)
PROGRESS NOTE    Beth Franco  [id number] DOB: 07/16/1934 DOA: 01/12/2021 PCP: Juline Patch, MD   Brief Narrative: Taken from H&P. Beth Franco is a 86 y.o. female with medical history significant of  HTN, DM type II, COPD, chronic respiratory failure with hypoxia requiring 2 to 3 L oxygen via nasal cannula at night only, CAD S/P stent, unspecified stroke with residual right lower extremity weakness and dyslipidiemia presented with worsening generalized weakness, inability to ambulate and falls.  She had 2 falls over 2 days.  At baseline patient has underlying dementia, lives with her daughter, able to move around with walker, able to perform simple ADLs and rest daughter helps.   Urine culture done on 01/11/2021 during initial ED visit was negative.  Blood cultures negative so far. COVID-19 and influenza PCR negative. CT head and cervical spine was without any acute abnormality. Chest x-ray with increased interstitial markings reflecting pulmonary edema, viral infection or combination along with right pleural effusion with adjacent airspace disease which could reflect pneumonia.  She was started on Rocephin and Zithromax.  Procalcitonin negative but she continued to spike fever.     Subjective: Patient feels better this morning.  Denies any shortness of breath or chest pain.  Cough is better.  No nausea or vomiting.    Assessment & Plan:   Community-acquired pneumonia Initial films were not conclusive however repeat x-ray raised concern for viral pneumonia versus pulmonary edema.  Patient was placed on ceftriaxone and azithromycin.  Also placed on furosemide due to concern for pulmonary edema and elevated BNP.  WBC was noted to be normal.  Lactic acid level was normal.  COVID-19 and influenza PCR's were negative.  Procalcitonin was less than 0.1.   Echocardiogram showed normal systolic function with grade 1 diastolic dysfunction. Overall patient seems to be improving.  Appears to be  primarily a viral infection but will complete course of antibacterials.  She was changed over to [facility] and oral azithromycin yesterday. Seen by speech therapy.  Aspiration precautions but no clear dysphagia noted on examination.  Remains on mechanical soft diet consistency.  Chronic respiratory failure with COPD   Patient is lifelong heavy smoker.  Uses 2 to 3 L of oxygen at night most of the time.  Continue with supplemental oxygen.  Occasional wheezes appreciated.  Bronchodilators as needed.   Stable for the most part.  Acute on chronic HFpEF Echocardiogram today with normal EF, grade 1 diastolic dysfunction and moderate to severe aortic stenosis.  BNP elevated at 1111.  Chest x-ray with concern of pulmonary edema.  Clinically appears euvolemic.  She was started on IV Lasix.  Changed over to oral furosemide.  Seems to be euvolemic.  Replace potassium. Strict ins and outs and daily weights.  Elevated troponin 247>>249.  Most likely secondary to demand ischemia.  Flat curve. No chest pain.    Do not anticipate any further testing at this time.  Type 2 diabetes mellitus.   CBGs are reasonably well controlled.  Continue SSI.  Normocytic anemia/B12 deficiency B12 noted to be 154.  Intramuscular supplementation for 5 days followed by oral supplementation.  Anemia panel showed ferritin to be 89, folic acid 30, TIBC [id number].    Essential hypertension Remains on losartan and metoprolol.  Monitor blood pressures closely.    Previous history of stroke with right-sided weakness Stable.  PT and OT evaluation.  Continue Plavix.  Dyslipidemia. -Continue statin  Generalized weakness associated with falls.  No acute injury -PT is recommending  SNF -TOC consult  DVT prophylaxis: Lovenox Code Status: DNR Family Communication: No family at bedside Disposition Plan: SNF when bed is available  Status is: Inpatient  Remains inpatient appropriate because:Inpatient level of care appropriate due to  severity of illness  Dispo: The patient is from: Home              Anticipated d/c is to: SNF              Patient currently is not medically stable to d/c.   Difficult to place patient No              Level of care: Med-Surg   Objective: Vitals:   01/16/21 0347 01/16/21 0350 01/16/21 0858 01/16/21 1130  BP:  120/68 (!) 136/56 (!) 127/51  Pulse:  74 80 70  Resp:  '19 18 19  '$ Temp:  98.8 F (37.1 C) 98.9 F (37.2 C) 99.2 F (37.3 C)  TempSrc:  Oral  Oral  SpO2:  97% 93% 95%  Weight: 71 kg     Height:        Intake/Output Summary (Last 24 hours) at 01/16/2021 1336 Last data filed at 01/16/2021 0945 Gross per 24 hour  Intake 720 ml  Output 400 ml  Net 320 ml    Filed Weights   01/14/21 0646 01/15/21 0500 01/16/21 0347  Weight: 70 kg 68.8 kg 71 kg    Examination:  General appearance: Awake alert.  In no distress Resp: Normal effort at rest this morning.  Occasional wheezing.  Few crackles at the bases.  No rhonchi. Cardio: S1-S2 is normal regular.  No S3-S4.  No rubs murmurs or bruit GI: Abdomen is soft.  Nontender nondistended.  Bowel sounds are present normal.  No masses organomegaly Extremities: No edema.  Full range of motion of lower extremities.     Consultants:  None  Procedures: None  Antimicrobials:  Anti-infectives (From admission, onward)    Start     Dose/Rate Route Frequency Ordered Stop   01/15/21 1400  azithromycin (ZITHROMAX) tablet 500 mg        500 mg Oral Daily 01/15/21 1236 01/16/21 0936   01/15/21 1330  cefdinir (OMNICEF) capsule 300 mg        300 mg Oral Every 12 hours 01/15/21 1236 01/18/21 0959   01/12/21 1700  cefTRIAXone (ROCEPHIN) 2 g in sodium chloride 0.9 % 100 mL IVPB  Status:  Discontinued        2 g 200 mL/hr over 30 Minutes Intravenous Every 24 hours 01/12/21 1648 01/15/21 1236   01/12/21 1700  azithromycin (ZITHROMAX) 500 mg in sodium chloride 0.9 % 250 mL IVPB  Status:  Discontinued        500 mg 250 mL/hr over 60 Minutes  Intravenous Every 24 hours 01/12/21 1648 01/15/21 1236   01/12/21 1630  cefTRIAXone (ROCEPHIN) 1 g in sodium chloride 0.9 % 100 mL IVPB        1 g 200 mL/hr over 30 Minutes Intravenous  Once 01/12/21 1621 01/12/21 1855   01/12/21 1630  azithromycin (ZITHROMAX) 500 mg in sodium chloride 0.9 % 250 mL IVPB        500 mg 250 mL/hr over 60 Minutes Intravenous  Once 01/12/21 1621 01/12/21 1855        Data Reviewed: I have personally reviewed following labs and imaging studies  CBC: Recent Labs  Lab 01/12/21 0002 01/12/21 1353 01/13/21 0636 01/15/21 0839  WBC 8.5 7.9 9.9 8.8  NEUTROABS 6.8  --   --   --  HGB 10.8* 11.0* 10.2* 10.2*  HCT 35.4* 34.1* 30.9* 32.2*  MCV 95.9 94.2 91.7 91.5  PLT 374 366 336 0000000    Basic Metabolic Panel: Recent Labs  Lab 01/12/21 0002 01/12/21 1353 01/13/21 0636 01/15/21 0839 01/16/21 0430  NA 139 138 135 138 138  K 4.3 3.9 3.6 3.8 3.4*  CL 103 101 97* 100 99  CO2 '26 26 28 28 31  '$ GLUCOSE 131* 126* 122* 184* 124*  BUN '19 16 17 '$ 32* 38*  CREATININE 1.07* 0.92 0.85 0.85 0.90  CALCIUM 9.0 8.6* 8.1* 7.9* 8.0*  MG  --   --   --  2.1  --     GFR: Estimated Creatinine Clearance: 46 mL/min (by C-G formula based on SCr of 0.9 mg/dL). Liver Function Tests: Recent Labs  Lab 01/12/21 0002 01/13/21 0636  AST 17 16  ALT 22 19  ALKPHOS 61 55  BILITOT 0.5 1.0  PROT 6.7 6.8  ALBUMIN 3.0* 3.0*    Coagulation Profile: Recent Labs  Lab 01/12/21 0002  INR 1.0    Cardiac Enzymes: Recent Labs  Lab 01/12/21 0002  CKTOTAL 31*    CBG: Recent Labs  Lab 01/15/21 1135 01/15/21 1619 01/15/21 2056 01/16/21 0856 01/16/21 1130  GLUCAP 203* 119* 138* 132* 151*    Anemia Panel: Recent Labs    01/15/21 0839  FOLATE 30.0  FERRITIN 89  TIBC 223*  IRON 12*  RETICCTPCT 1.3    Sepsis Labs: Recent Labs  Lab 01/12/21 0002 01/12/21 1558 01/12/21 2211 01/13/21 0636 01/14/21 0817 01/15/21 0839  PROCALCITON  --   --   --  <0.10 <0.10  <0.10  LATICACIDVEN 1.4 0.7 0.9  --   --   --      Recent Results (from the past 240 hour(s))  Urine Culture     Status: None   Collection Time: 01/11/21 11:41 PM   Specimen: In/Out Cath Urine  Result Value Ref Range Status   Specimen Description IN/OUT CATH URINE  Final   Special Requests NONE  Final   Culture   Final    NO GROWTH Performed at [facility], [place] [address] [place], [place] [address]    Report Status 01/13/2021 FINAL  Final  Resp Panel by RT-PCR (Flu A&B, Covid) Nasopharyngeal Swab     Status: None   Collection Time: 01/12/21 12:02 AM   Specimen: Nasopharyngeal Swab; Nasopharyngeal(NP) swabs in vial transport medium  Result Value Ref Range Status   SARS Coronavirus 2 by RT PCR NEGATIVE NEGATIVE Final    Comment: (NOTE) SARS-CoV-2 target nucleic acids are NOT DETECTED.  The SARS-CoV-2 RNA is generally detectable in upper respiratory specimens during the acute phase of infection. The lowest concentration of SARS-CoV-2 viral copies this assay can detect is 138 copies/mL. A negative result does not preclude SARS-Cov-2 infection and should not be used as the sole basis for treatment or other patient management decisions. A negative result may occur with  improper specimen collection/handling, submission of specimen other than nasopharyngeal swab, presence of viral mutation(s) within the areas targeted by this assay, and inadequate number of viral copies(<138 copies/mL). A negative result must be combined with clinical observations, patient history, and epidemiological information. The expected result is Negative.  Fact Sheet for Patients:  EntrepreneurPulse.com.au  Fact Sheet for Healthcare Providers:  IncredibleEmployment.be  This test is no t yet approved or cleared by the [place] FDA and  has been authorized for detection and/or diagnosis of SARS-CoV-2 by FDA  under an Emergency Use Authorization (EUA). This  EUA will remain  in effect (meaning this test can be used) for the duration of the COVID-19 declaration under Section 564(b)(1) of the Act, 21 U.S.C.section 360bbb-3(b)(1), unless the authorization is terminated  or revoked sooner.       Influenza A by PCR NEGATIVE NEGATIVE Final   Influenza B by PCR NEGATIVE NEGATIVE Final    Comment: (NOTE) The Xpert Xpress SARS-CoV-2/FLU/RSV plus assay is intended as an aid in the diagnosis of influenza from Nasopharyngeal swab specimens and should not be used as a sole basis for treatment. Nasal washings and aspirates are unacceptable for Xpert Xpress SARS-CoV-2/FLU/RSV testing.  Fact Sheet for Patients: EntrepreneurPulse.com.au  Fact Sheet for Healthcare Providers: IncredibleEmployment.be  This test is not yet approved or cleared by the [place] FDA and has been authorized for detection and/or diagnosis of SARS-CoV-2 by FDA under an Emergency Use Authorization (EUA). This EUA will remain in effect (meaning this test can be used) for the duration of the COVID-19 declaration under Section 564(b)(1) of the Act, 21 U.S.C. section 360bbb-3(b)(1), unless the authorization is terminated or revoked.  Performed at [facility], [place] [address] [place], [place] [address]   Blood Culture (routine x 2)     Status: None (Preliminary result)   Collection Time: 01/12/21 12:02 AM   Specimen: BLOOD  Result Value Ref Range Status   Specimen Description BLOOD RIGHT ANTECUBITAL  Final   Special Requests   Final    BOTTLES DRAWN AEROBIC AND ANAEROBIC Blood Culture adequate volume   Culture   Final    NO GROWTH 4 DAYS Performed at [facility], [place] [address] [place], [place] [address]    Report Status PENDING  Incomplete  Blood Culture (routine x 2)     Status: None (Preliminary result)   Collection Time: 01/12/21 12:07 AM   Specimen: BLOOD  Result Value Ref Range Status   Specimen Description  BLOOD SITE NOT SPECIFIED  Final   Special Requests   Final    BOTTLES DRAWN AEROBIC AND ANAEROBIC Blood Culture results may not be optimal due to an inadequate volume of blood received in culture bottles   Culture   Final    NO GROWTH 4 DAYS Performed at [facility], [place] [address], [place], [place] [address]    Report Status PENDING  Incomplete  Resp Panel by RT-PCR (Flu A&B, Covid) Nasopharyngeal Swab     Status: None   Collection Time: 01/12/21  1:53 PM   Specimen: Nasopharyngeal Swab; Nasopharyngeal(NP) swabs in vial transport medium  Result Value Ref Range Status   SARS Coronavirus 2 by RT PCR NEGATIVE NEGATIVE Final    Comment: (NOTE) SARS-CoV-2 target nucleic acids are NOT DETECTED.  The SARS-CoV-2 RNA is generally detectable in upper respiratory specimens during the acute phase of infection. The lowest concentration of SARS-CoV-2 viral copies this assay can detect is 138 copies/mL. A negative result does not preclude SARS-Cov-2 infection and should not be used as the sole basis for treatment or other patient management decisions. A negative result may occur with  improper specimen collection/handling, submission of specimen other than nasopharyngeal swab, presence of viral mutation(s) within the areas targeted by this assay, and inadequate number of viral copies(<138 copies/mL). A negative result must be combined with clinical observations, patient history, and epidemiological information. The expected result is Negative.  Fact Sheet for Patients:  EntrepreneurPulse.com.au  Fact Sheet for Healthcare Providers:  IncredibleEmployment.be  This test is no t yet approved or cleared by the [place] and  has been authorized for detection and/or diagnosis of SARS-CoV-2 by FDA under an Emergency Use Authorization (EUA). This EUA will remain  in effect (meaning this test can be used) for the duration of the COVID-19 declaration  under Section 564(b)(1) of the Act, 21 U.S.C.section 360bbb-3(b)(1), unless the authorization is terminated  or revoked sooner.       Influenza A by PCR NEGATIVE NEGATIVE Final   Influenza B by PCR NEGATIVE NEGATIVE Final    Comment: (NOTE) The Xpert Xpress SARS-CoV-2/FLU/RSV plus assay is intended as an aid in the diagnosis of influenza from Nasopharyngeal swab specimens and should not be used as a sole basis for treatment. Nasal washings and aspirates are unacceptable for Xpert Xpress SARS-CoV-2/FLU/RSV testing.  Fact Sheet for Patients: EntrepreneurPulse.com.au  Fact Sheet for Healthcare Providers: IncredibleEmployment.be  This test is not yet approved or cleared by the [place] FDA and has been authorized for detection and/or diagnosis of SARS-CoV-2 by FDA under an Emergency Use Authorization (EUA). This EUA will remain in effect (meaning this test can be used) for the duration of the COVID-19 declaration under Section 564(b)(1) of the Act, 21 U.S.C. section 360bbb-3(b)(1), unless the authorization is terminated or revoked.  Performed at [facility], [address] [place], [place] [address]   Blood culture (routine x 2)     Status: None (Preliminary result)   Collection Time: 01/12/21  3:58 PM   Specimen: BLOOD  Result Value Ref Range Status   Specimen Description BLOOD BLOOD LEFT WRIST  Final   Special Requests   Final    BOTTLES DRAWN AEROBIC AND ANAEROBIC Blood Culture adequate volume   Culture   Final    NO GROWTH 4 DAYS Performed at [facility], [address] [place], [place] [address]    Report Status PENDING  Incomplete  Blood culture (routine x 2)     Status: None (Preliminary result)   Collection Time: 01/12/21  3:58 PM   Specimen: BLOOD  Result Value Ref Range Status   Specimen Description BLOOD LEFT ANTECUBITAL  Final   Special Requests   Final    BOTTLES DRAWN AEROBIC AND ANAEROBIC  Blood Culture adequate volume   Culture   Final    NO GROWTH 4 DAYS Performed at [facility], [address] [place], [place] [address]    Report Status PENDING  Incomplete      Radiology Studies: No results found.  Scheduled Meds:  cefdinir  300 mg Oral Q12H   chlorhexidine  15 mL Mouth Rinse BID   clopidogrel  75 mg Oral Daily   cyanocobalamin  1,000 mcg Intramuscular Daily   Followed by   Derrill Memo ON 01/19/2021] vitamin B-12  1,000 mcg Oral Daily   enoxaparin (LOVENOX) injection  40 mg Subcutaneous Q24H   feeding supplement (GLUCERNA SHAKE)  237 mL Oral TID BM   ferrous sulfate  325 mg Oral Q breakfast   furosemide  40 mg Oral Daily   insulin aspart  0-15 Units Subcutaneous TID WC   insulin aspart  0-5 Units Subcutaneous QHS   losartan  100 mg Oral Daily   mouth rinse  15 mL Mouth Rinse q12n4p   melatonin  2.5 mg Oral QHS   metoprolol tartrate  50 mg Oral BID   pravastatin  40 mg Oral q1800   sodium chloride flush  3 mL Intravenous Q12H   Continuous  Infusions:     LOS: 3 days    Bonnielee Haff, MD [facility]  If 7PM-7AM, please contact night-coverage [email or web address]  01/16/2021, 1:36 PM

## 2021-01-16 NOTE — TOC Progression Note (Addendum)
Transition of Care [facility]) - Progression Note    Patient Details  Name: Beth Franco MRN: [id number] Date of Birth: 19-Jun-1934  Transition of Care [facility]) CM/SW [place], LCSW Phone Number: 01/16/2021, 3:09 PM  Clinical Narrative: Notified [facility] admissions coordinator that daughter accepted bed offer yesterday. Started insurance authorization through [employer] portal.    4:39 pm: Insurance authorization still pending. [facility] will not have a bed until Monday.  Expected Discharge Plan: [place] Barriers to Discharge: Continued Medical Work up  Expected Discharge Plan and Services Expected Discharge Plan: [place] Choice: [place] arrangements for the past 2 months: Single Family Home                                       Social Determinants of Health (SDOH) Interventions    Readmission Risk Interventions No flowsheet data found.

## 2021-01-16 NOTE — Progress Notes (Signed)
   01/13/21 1239  Assess: MEWS Score  Temp (!) 101.6 F (38.7 C)  BP (!) 151/55  Pulse Rate 68  Resp 18  SpO2 99 %  O2 Device Nasal Cannula  O2 Flow Rate (L/min) 3 L/min  Assess: MEWS Score  MEWS Temp 2  MEWS Systolic 0  MEWS Pulse 0  MEWS RR 0  MEWS LOC 0  MEWS Score 2  MEWS Score Color Yellow  Assess: if the MEWS score is Yellow or Red  Were vital signs taken at a resting state? Yes  Focused Assessment No change from prior assessment  Does the patient meet 2 or more of the SIRS criteria? No  MEWS guidelines implemented *See Row Information* Yes  Treat  MEWS Interventions Administered prn meds/treatments  Pain Scale 0-10  Pain Score 0  Take Vital Signs  Increase Vital Sign Frequency  Yellow: Q 2hr X 2 then Q 4hr X 2, if remains yellow, continue Q 4hrs  Escalate  MEWS: Escalate Yellow: discuss with charge nurse/RN and consider discussing with provider and RRT  Notify: Charge Nurse/RN  Name of Charge Nurse/RN Notified Janett Billow  Date Charge Nurse/RN Notified 01/13/21  Time Charge Nurse/RN Notified 1250  Assess: SIRS CRITERIA  SIRS Temperature  1  SIRS Pulse 0  SIRS Respirations  0  SIRS WBC 0  SIRS Score Sum  1  Inserted for Emmit Pomfret RN

## 2021-01-16 NOTE — Evaluation (Signed)
Occupational Therapy Evaluation Patient Details Name: Beth Franco MRN: [id number] DOB: 30-Jun-1934 Today's Date: 01/16/2021   History of Present Illness 86 y.o. female with medical history significant of  HTN, DM type II, COPD, chronic respiratory failure with hypoxia requiring 2 to 3 L oxygen via nasal cannula at night only, CAD S/P stent, unspecified stroke with residual right lower extremity weakness and dyslipidiemia presented with worsening generalized weakness, inability to ambulate and falls.   Clinical Impression   Pt seen for OT evaluation this date in setting of acute hospitalization d/t falls and weakness. Pt reports to this author that she had gradually stopped walking at home but is vague regarding the cause. Pt is somewhat of a poor historian. She does share with this [facility]ia that she thinks she's accomplished all she will in this life. OT implements therapeutic listening and notifies RN as pt may have palliative care needs and may be wanting to address her goals of care (more details below). Pt agreeable to light therapy session. She requires MAX A to come to sitting and MOD A to sustain static sitting balance. Pt is generally weak in trunk and limbs which limits her ability to safely and efficiently perform ADLs/ADL mobility. Pt requires MIN/MOD A for UB ADLs and MAX to TOTAL A for LB ADLs at this time. OT will continue to follow acutely as appropriate (with consideration to patient's wishes and goals). Should she continue to pursue a more aggressive treatment route, anticipate she will require extensive rehabilitation efforts in STR setting before returning home with daughter.      Recommendations for follow up therapy are one component of a multi-disciplinary discharge planning process, led by the attending physician.  Recommendations may be updated based on patient status, additional functional criteria and insurance authorization.   Follow Up Recommendations  SNF    Equipment  Recommendations  Other (comment) (defer)    Recommendations for Other Services       Precautions / Restrictions Precautions Precautions: Fall Restrictions Weight Bearing Restrictions: No      Mobility Bed Mobility Overal bed mobility: Needs Assistance Bed Mobility: Supine to Sit;Sit to Supine     Supine to sit: Max assist Sit to supine: Max assist   General bed mobility comments: increased time and effort, HOB elevated, use of bed rails.    Transfers                 General transfer comment: deferred - unable to maintain sitting balance    Balance Overall balance assessment: Needs assistance Sitting-balance support: No upper extremity supported;Feet supported Sitting balance-Leahy Scale: Poor Sitting balance - Comments: very limited sitting balance and tolerance. R leateral lean throughout. Unable to sustain static sitting w/o UE support and even then will lose balance to R occasionally without external support from therapist. Postural control: Right lateral lean     Standing balance comment: unsafe/unable                           ADL either performed or assessed with clinical judgement   ADL Overall ADL's : Needs assistance/impaired                                       General ADL Comments: MOD A to perform seated UB ADLs with support for sitting as well. MAX A to TOTAL A for LB ADLs bed  level or sitting. MAX A for bed mobility.     Vision Patient Visual Report: No change from baseline       Perception     Praxis      Pertinent Vitals/Pain Pain Assessment: No/denies pain     Hand Dominance     Extremity/Trunk Assessment Upper Extremity Assessment Upper Extremity Assessment: Generalized weakness   Lower Extremity Assessment Lower Extremity Assessment: Generalized weakness   Cervical / Trunk Assessment Cervical / Trunk Assessment: Kyphotic   Communication     Cognition Arousal/Alertness:  Awake/alert Behavior During Therapy: WFL for tasks assessed/performed Overall Cognitive Status: No family/caregiver present to determine baseline cognitive functioning                                 General Comments: Limited recall of information and conversation, often repeating self during session. She is oriented to month and "[place]" but unable to state city, date or day. Follows ~80-90% simple one step commands   General Comments       Exercises Other Exercises Other Exercises: OT ed with pt re: role of OT, importance of OOB activity and ways to strengthen core for sitting balance and tolerance outside of therapy time. Pt shares with this clinician that she thinks she has accomplished all she will in life, and at 45, she's "lived one hell of a life" and thinks she would "be content with dying". Therapeutic listening provided and OT educates pt about potential need to talk to palliative care services. Pt agreeable and OT notifies RN. Will keep this in mind wiht further chart reviews and potential appropriateness of continuing OT services.   Shoulder Instructions      Home Living Family/patient expects to be discharged to:: Private residence Living Arrangements: Children (dtr) Available Help at Discharge: Family;Available PRN/intermittently Type of Home: House Home Access: Stairs to enter [employer] of Steps: 1 Entrance Stairs-Rails: None Home Layout: One level     Bathroom Shower/Tub: Tub/shower unit         Home Equipment: Environmental consultant - 2 wheels;Bedside commode;Grab bars - tub/shower;Grab bars - toilet;Shower seat          Prior Functioning/Environment Level of Independence: Needs assistance  Gait / Transfers Assistance Needed: states she was able to ambulate and toilet Mod I using RW while daughter was at work during the day ADL's / [employer] Assistance Needed: assist with all ADLs   Comments: Difficulty obtaining information, unsure of  accuracy.        OT Problem List: Decreased strength;Decreased range of motion;Decreased activity tolerance;Impaired balance (sitting and/or standing);Decreased coordination;Decreased safety awareness;Impaired UE functional use      OT Treatment/Interventions: Self-care/ADL training;Therapeutic exercise;DME and/or AE instruction;Therapeutic activities;Patient/family education;Balance training    OT Goals(Current goals can be found in the care plan section) Acute Rehab OT Goals Patient Stated Goal: none stated OT Goal Formulation: With patient Time For Goal Achievement: 01/30/21 Potential to Achieve Goals: Fair ADL Goals Pt Will Perform Grooming: with min guard assist;sitting (with F balance while completing 2-3 g/h tasks to increase sitting tolerance and balance.) Pt Will Perform Upper Body Dressing: with min guard assist;with min assist;sitting (with F balance) Pt Will Transfer to Toilet: with max assist;squat pivot transfer;bedside commode  OT Frequency: Min 1X/week   Barriers to D/C:            Co-evaluation  AM-PAC OT "6 Clicks" Daily Activity     Outcome Measure Help from another person eating meals?: A Little Help from another person taking care of personal grooming?: A Little Help from another person toileting, which includes using toliet, bedpan, or urinal?: Total Help from another person bathing (including washing, rinsing, drying)?: A Lot Help from another person to put on and taking off regular upper body clothing?: A Lot Help from another person to put on and taking off regular lower body clothing?: Total 6 Click Score: 12   End of Session Nurse Communication: Mobility status;Other (comment) (potential palliative needs)  Activity Tolerance: Patient tolerated treatment well Patient left: in bed;with call bell/phone within reach;with bed alarm set  OT Visit Diagnosis: Unsteadiness on feet (R26.81);History of falling (Z91.81)                Time:  [id number] OT Time Calculation (min): 22 min Charges:  OT General Charges $OT Visit: 1 Visit OT Evaluation $OT Eval Moderate Complexity: 1 Mod OT Treatments $Self Care/Home Management : 8-22 mins  Gerrianne Scale, MS, OTR/L ascom [phone] 01/16/21, 6:45 PM

## 2021-01-17 DIAGNOSIS — I10 Essential (primary) hypertension: Secondary | ICD-10-CM | POA: Diagnosis not present

## 2021-01-17 LAB — GLUCOSE, CAPILLARY
Glucose-Capillary: 136 mg/dL — ABNORMAL HIGH (ref 70–99)
Glucose-Capillary: 201 mg/dL — ABNORMAL HIGH (ref 70–99)
Glucose-Capillary: 112 mg/dL — ABNORMAL HIGH (ref 70–99)
Glucose-Capillary: 175 mg/dL — ABNORMAL HIGH (ref 70–99)

## 2021-01-17 LAB — CULTURE, BLOOD (ROUTINE X 2)
Culture: NO GROWTH
Culture: NO GROWTH
Culture: NO GROWTH
Culture: NO GROWTH
Special Requests: ADEQUATE
Special Requests: ADEQUATE
Special Requests: ADEQUATE

## 2021-01-17 LAB — BASIC METABOLIC PANEL
Anion gap: 6 (ref 5–15)
BUN: 33 mg/dL — ABNORMAL HIGH (ref 8–23)
CO2: 33 mmol/L — ABNORMAL HIGH (ref 22–32)
Calcium: 8.3 mg/dL — ABNORMAL LOW (ref 8.9–10.3)
Chloride: 101 mmol/L (ref 98–111)
Creatinine, Ser: 0.81 mg/dL (ref 0.44–1.00)
GFR, Estimated: >60 mL/min (ref >60)
Glucose, Bld: 132 mg/dL — ABNORMAL HIGH (ref 70–99)
Potassium: 4.6 mmol/L (ref 3.5–5.1)
Sodium: 140 mmol/L (ref 135–145)

## 2021-01-17 NOTE — Progress Notes (Signed)
PROGRESS NOTE    Beth Franco  [id number] DOB: March 16, 1935 DOA: 01/12/2021 PCP: Juline Patch, MD   Brief Narrative: Taken from H&P. Beth Franco is a 86 y.o. female with medical history significant of  HTN, DM type II, COPD, chronic respiratory failure with hypoxia requiring 2 to 3 L oxygen via nasal cannula at night only, CAD S/P stent, unspecified stroke with residual right lower extremity weakness and dyslipidiemia presented with worsening generalized weakness, inability to ambulate and falls.  She had 2 falls over 2 days.  At baseline patient has underlying dementia, lives with her daughter, able to move around with walker, able to perform simple ADLs and rest daughter helps.   Urine culture done on 01/11/2021 during initial ED visit was negative.  Blood cultures negative so far. COVID-19 and influenza PCR negative. CT head and cervical spine was without any acute abnormality. Chest x-ray with increased interstitial markings reflecting pulmonary edema, viral infection or combination along with right pleural effusion with adjacent airspace disease which could reflect pneumonia.  She was started on Rocephin and Zithromax.  Procalcitonin negative but she continued to spike fever.     Subjective: Patient denies any complaints.  Slept well overnight.  No other issues reported.     Assessment & Plan:   Community-acquired pneumonia Initial films were not conclusive however repeat x-ray raised concern for viral pneumonia versus pulmonary edema.  Patient was placed on ceftriaxone and azithromycin.  Also placed on furosemide due to concern for pulmonary edema and elevated BNP.  WBC was noted to be normal.  Lactic acid level was normal.  COVID-19 and influenza PCR's were negative.  Procalcitonin was less than 0.1.   Echocardiogram showed normal systolic function with grade 1 diastolic dysfunction. Overall patient seems to be improving.  Appears to be primarily a viral infection but will complete  course of antibacterials.  She was changed over to [facility] and oral azithromycin. Seen by speech therapy.  Aspiration precautions but no clear dysphagia noted on examination.  Remains on mechanical soft diet consistency. Stable from a respiratory standpoint.  We will stop antibiotics after today.  Chronic respiratory failure with COPD   Patient is lifelong heavy smoker.  Uses 2 to 3 L of oxygen at night most of the time.  Continue with supplemental oxygen.  Occasional wheezes appreciated.  Bronchodilators as needed.   Stable for the most part.  Acute on chronic HFpEF/hypokalemia Echocardiogram today with normal EF, grade 1 diastolic dysfunction and moderate to severe aortic stenosis.  BNP elevated at 1111.  Chest x-ray with concern of pulmonary edema.  Clinically appears euvolemic.  She was started on IV Lasix.  Changed over to oral furosemide.  Seems to be euvolemic.  Potassium has been corrected. Strict ins and outs and daily weights.  Elevated troponin 247>>249.  Most likely secondary to demand ischemia.  Flat curve. No chest pain.    Do not anticipate any further testing at this time.  Type 2 diabetes mellitus.   CBGs are reasonably well controlled.  Continue SSI.  Normocytic anemia/B12 deficiency B12 noted to be 154.  Intramuscular supplementation for 5 days followed by oral supplementation.  Anemia panel showed ferritin to be 89, folic acid 30, TIBC [id number].    Essential hypertension Remains on losartan and metoprolol.  Monitor blood pressures closely.  Reasonably well controlled with occasional high readings.  Previous history of stroke with right-sided weakness Stable.  PT and OT evaluation.  Continue Plavix.  Dyslipidemia. -Continue statin  Generalized weakness  associated with falls.  No acute injury -PT is recommending SNF -TOC consult  DVT prophylaxis: Lovenox Code Status: DNR Family Communication: No family at bedside Disposition Plan: SNF when bed is available.  Patient  is medically stable.  Status is: Inpatient  Remains inpatient appropriate because:Inpatient level of care appropriate due to severity of illness  Dispo: The patient is from: Home              Anticipated d/c is to: SNF              Patient currently is medically stable to d/c.   Difficult to place patient No              Level of care: Med-Surg   Objective: Vitals:   01/16/21 2213 01/17/21 0352 01/17/21 0600 01/17/21 0816  BP: (!) 149/46 (!) 157/46  (!) 143/59  Pulse: 70 73  83  Resp: '16 18  19  '$ Temp: 98.4 F (36.9 C) 99 F (37.2 C)  98.7 F (37.1 C)  TempSrc:      SpO2: 96% 100%  99%  Weight:   68.4 kg   Height:        Intake/Output Summary (Last 24 hours) at 01/17/2021 1034 Last data filed at 01/17/2021 1015 Gross per 24 hour  Intake 0 ml  Output 500 ml  Net -500 ml    Filed Weights   01/15/21 0500 01/16/21 0347 01/17/21 0600  Weight: 68.8 kg 71 kg 68.4 kg    Examination:  General appearance: Awake alert.  In no distress Resp: Normal effort.  Occasional wheezing.  No rhonchi.  Few crackles at the bases. Cardio: S1-S2 is normal regular.  No S3-S4.  No rubs murmurs or bruit GI: Abdomen is soft.  Nontender nondistended.  Bowel sounds are present normal.  No masses organomegaly Extremities: No edema.  Full range of motion of lower extremities. Neurologic: Right-sided weakness from previous stroke as before.      Consultants:  None  Procedures: None  Antimicrobials:  Anti-infectives (From admission, onward)    Start     Dose/Rate Route Frequency Ordered Stop   01/15/21 1400  azithromycin (ZITHROMAX) tablet 500 mg        500 mg Oral Daily 01/15/21 1236 01/16/21 0936   01/15/21 1330  cefdinir (OMNICEF) capsule 300 mg        300 mg Oral Every 12 hours 01/15/21 1236 01/18/21 0959   01/12/21 1700  cefTRIAXone (ROCEPHIN) 2 g in sodium chloride 0.9 % 100 mL IVPB  Status:  Discontinued        2 g 200 mL/hr over 30 Minutes Intravenous Every 24 hours 01/12/21  1648 01/15/21 1236   01/12/21 1700  azithromycin (ZITHROMAX) 500 mg in sodium chloride 0.9 % 250 mL IVPB  Status:  Discontinued        500 mg 250 mL/hr over 60 Minutes Intravenous Every 24 hours 01/12/21 1648 01/15/21 1236   01/12/21 1630  cefTRIAXone (ROCEPHIN) 1 g in sodium chloride 0.9 % 100 mL IVPB        1 g 200 mL/hr over 30 Minutes Intravenous  Once 01/12/21 1621 01/12/21 1855   01/12/21 1630  azithromycin (ZITHROMAX) 500 mg in sodium chloride 0.9 % 250 mL IVPB        500 mg 250 mL/hr over 60 Minutes Intravenous  Once 01/12/21 1621 01/12/21 1855        Data Reviewed: I have personally reviewed following labs and imaging studies  CBC: Recent  Labs  Lab 01/12/21 0002 01/12/21 1353 01/13/21 0636 01/15/21 0839  WBC 8.5 7.9 9.9 8.8  NEUTROABS 6.8  --   --   --   HGB 10.8* 11.0* 10.2* 10.2*  HCT 35.4* 34.1* 30.9* 32.2*  MCV 95.9 94.2 91.7 91.5  PLT 374 366 336 0000000    Basic Metabolic Panel: Recent Labs  Lab 01/12/21 1353 01/13/21 0636 01/15/21 0839 01/16/21 0430 01/17/21 0415  NA 138 135 138 138 140  K 3.9 3.6 3.8 3.4* 4.6  CL 101 97* 100 99 101  CO2 '26 28 28 31 '$ 33*  GLUCOSE 126* 122* 184* 124* 132*  BUN 16 17 32* 38* 33*  CREATININE 0.92 0.85 0.85 0.90 0.81  CALCIUM 8.6* 8.1* 7.9* 8.0* 8.3*  MG  --   --  2.1  --   --     GFR: Estimated Creatinine Clearance: 50.3 mL/min (by C-G formula based on SCr of 0.81 mg/dL). Liver Function Tests: Recent Labs  Lab 01/12/21 0002 01/13/21 0636  AST 17 16  ALT 22 19  ALKPHOS 61 55  BILITOT 0.5 1.0  PROT 6.7 6.8  ALBUMIN 3.0* 3.0*    Coagulation Profile: Recent Labs  Lab 01/12/21 0002  INR 1.0    Cardiac Enzymes: Recent Labs  Lab 01/12/21 0002  CKTOTAL 31*    CBG: Recent Labs  Lab 01/16/21 0856 01/16/21 1130 01/16/21 1740 01/16/21 2211 01/17/21 0801  GLUCAP 132* 151* 106* 158* 136*    Anemia Panel: Recent Labs    01/15/21 0839  FOLATE 30.0  FERRITIN 89  TIBC 223*  IRON 12*  RETICCTPCT  1.3    Sepsis Labs: Recent Labs  Lab 01/12/21 0002 01/12/21 1558 01/12/21 2211 01/13/21 0636 01/14/21 0817 01/15/21 0839  PROCALCITON  --   --   --  <0.10 <0.10 <0.10  LATICACIDVEN 1.4 0.7 0.9  --   --   --      Recent Results (from the past 240 hour(s))  Urine Culture     Status: None   Collection Time: 01/11/21 11:41 PM   Specimen: In/Out Cath Urine  Result Value Ref Range Status   Specimen Description IN/OUT CATH URINE  Final   Special Requests NONE  Final   Culture   Final    NO GROWTH Performed at [facility], [place] [address] [place], [place] [address]    Report Status 01/13/2021 FINAL  Final  Resp Panel by RT-PCR (Flu A&B, Covid) Nasopharyngeal Swab     Status: None   Collection Time: 01/12/21 12:02 AM   Specimen: Nasopharyngeal Swab; Nasopharyngeal(NP) swabs in vial transport medium  Result Value Ref Range Status   SARS Coronavirus 2 by RT PCR NEGATIVE NEGATIVE Final    Comment: (NOTE) SARS-CoV-2 target nucleic acids are NOT DETECTED.  The SARS-CoV-2 RNA is generally detectable in upper respiratory specimens during the acute phase of infection. The lowest concentration of SARS-CoV-2 viral copies this assay can detect is 138 copies/mL. A negative result does not preclude SARS-Cov-2 infection and should not be used as the sole basis for treatment or other patient management decisions. A negative result may occur with  improper specimen collection/handling, submission of specimen other than nasopharyngeal swab, presence of viral mutation(s) within the areas targeted by this assay, and inadequate number of viral copies(<138 copies/mL). A negative result must be combined with clinical observations, patient history, and epidemiological information. The expected result is Negative.  Fact Sheet for Patients:  EntrepreneurPulse.com.au  Fact Sheet for Healthcare Providers:  IncredibleEmployment.be  This test is no t yet  approved or cleared by the [place] and  has been authorized for detection and/or diagnosis of SARS-CoV-2 by FDA under an Emergency Use Authorization (EUA). This EUA will remain  in effect (meaning this test can be used) for the duration of the COVID-19 declaration under Section 564(b)(1) of the Act, 21 U.S.C.section 360bbb-3(b)(1), unless the authorization is terminated  or revoked sooner.       Influenza A by PCR NEGATIVE NEGATIVE Final   Influenza B by PCR NEGATIVE NEGATIVE Final    Comment: (NOTE) The Xpert Xpress SARS-CoV-2/FLU/RSV plus assay is intended as an aid in the diagnosis of influenza from Nasopharyngeal swab specimens and should not be used as a sole basis for treatment. Nasal washings and aspirates are unacceptable for Xpert Xpress SARS-CoV-2/FLU/RSV testing.  Fact Sheet for Patients: EntrepreneurPulse.com.au  Fact Sheet for Healthcare Providers: IncredibleEmployment.be  This test is not yet approved or cleared by the [place] FDA and has been authorized for detection and/or diagnosis of SARS-CoV-2 by FDA under an Emergency Use Authorization (EUA). This EUA will remain in effect (meaning this test can be used) for the duration of the COVID-19 declaration under Section 564(b)(1) of the Act, 21 U.S.C. section 360bbb-3(b)(1), unless the authorization is terminated or revoked.  Performed at [facility], [place] [address], [place], [place] [address]   Blood Culture (routine x 2)     Status: None   Collection Time: 01/12/21 12:02 AM   Specimen: BLOOD  Result Value Ref Range Status   Specimen Description BLOOD RIGHT ANTECUBITAL  Final   Special Requests   Final    BOTTLES DRAWN AEROBIC AND ANAEROBIC Blood Culture adequate volume   Culture   Final    NO GROWTH 5 DAYS Performed at [facility], [place] [address], [place], [place] [address]    Report Status 01/17/2021 FINAL  Final  Blood Culture (routine x  2)     Status: None   Collection Time: 01/12/21 12:07 AM   Specimen: BLOOD  Result Value Ref Range Status   Specimen Description BLOOD SITE NOT SPECIFIED  Final   Special Requests   Final    BOTTLES DRAWN AEROBIC AND ANAEROBIC Blood Culture results may not be optimal due to an inadequate volume of blood received in culture bottles   Culture   Final    NO GROWTH 5 DAYS Performed at [facility], [place] [address] [place],  [address]    Report Status 01/17/2021 FINAL  Final  Resp Panel by RT-PCR (Flu A&B, Covid) Nasopharyngeal Swab     Status: None   Collection Time: 01/12/21  1:53 PM   Specimen: Nasopharyngeal Swab; Nasopharyngeal(NP) swabs in vial transport medium  Result Value Ref Range Status   SARS Coronavirus 2 by RT PCR NEGATIVE NEGATIVE Final    Comment: (NOTE) SARS-CoV-2 target nucleic acids are NOT DETECTED.  The SARS-CoV-2 RNA is generally detectable in upper respiratory specimens during the acute phase of infection. The lowest concentration of SARS-CoV-2 viral copies this assay can detect is 138 copies/mL. A negative result does not preclude SARS-Cov-2 infection and should not be used as the sole basis for treatment or other patient management decisions. A negative result may occur with  improper specimen collection/handling, submission of specimen other than nasopharyngeal swab, presence of viral mutation(s) within the areas targeted by this assay, and inadequate number of viral copies(<138 copies/mL). A negative result must be combined with  clinical observations, patient history, and epidemiological information. The expected result is Negative.  Fact Sheet for Patients:  EntrepreneurPulse.com.au  Fact Sheet for Healthcare Providers:  IncredibleEmployment.be  This test is no t yet approved or cleared by the [place] FDA and  has been authorized for detection and/or diagnosis of SARS-CoV-2 by FDA under an Emergency  Use Authorization (EUA). This EUA will remain  in effect (meaning this test can be used) for the duration of the COVID-19 declaration under Section 564(b)(1) of the Act, 21 U.S.C.section 360bbb-3(b)(1), unless the authorization is terminated  or revoked sooner.       Influenza A by PCR NEGATIVE NEGATIVE Final   Influenza B by PCR NEGATIVE NEGATIVE Final    Comment: (NOTE) The Xpert Xpress SARS-CoV-2/FLU/RSV plus assay is intended as an aid in the diagnosis of influenza from Nasopharyngeal swab specimens and should not be used as a sole basis for treatment. Nasal washings and aspirates are unacceptable for Xpert Xpress SARS-CoV-2/FLU/RSV testing.  Fact Sheet for Patients: EntrepreneurPulse.com.au  Fact Sheet for Healthcare Providers: IncredibleEmployment.be  This test is not yet approved or cleared by the [place] FDA and has been authorized for detection and/or diagnosis of SARS-CoV-2 by FDA under an Emergency Use Authorization (EUA). This EUA will remain in effect (meaning this test can be used) for the duration of the COVID-19 declaration under Section 564(b)(1) of the Act, 21 U.S.C. section 360bbb-3(b)(1), unless the authorization is terminated or revoked.  Performed at [facility], [address] [place], [place] [address]   Blood culture (routine x 2)     Status: None   Collection Time: 01/12/21  3:58 PM   Specimen: BLOOD  Result Value Ref Range Status   Specimen Description BLOOD BLOOD LEFT WRIST  Final   Special Requests   Final    BOTTLES DRAWN AEROBIC AND ANAEROBIC Blood Culture adequate volume   Culture   Final    NO GROWTH 5 DAYS Performed at [facility], [address] [place], [place] [address]    Report Status 01/17/2021 FINAL  Final  Blood culture (routine x 2)     Status: None   Collection Time: 01/12/21  3:58 PM   Specimen: BLOOD  Result Value Ref Range Status   Specimen Description  BLOOD LEFT ANTECUBITAL  Final   Special Requests   Final    BOTTLES DRAWN AEROBIC AND ANAEROBIC Blood Culture adequate volume   Culture   Final    NO GROWTH 5 DAYS Performed at [facility], [address] [place], [place] [address]    Report Status 01/17/2021 FINAL  Final      Radiology Studies: No results found.  Scheduled Meds:  cefdinir  300 mg Oral Q12H   chlorhexidine  15 mL Mouth Rinse BID   clopidogrel  75 mg Oral Daily   cyanocobalamin  1,000 mcg Intramuscular Daily   Followed by   Derrill Memo ON 01/19/2021] vitamin B-12  1,000 mcg Oral Daily   enoxaparin (LOVENOX) injection  40 mg Subcutaneous Q24H   feeding supplement (GLUCERNA SHAKE)  237 mL Oral TID BM   ferrous sulfate  325 mg Oral Q breakfast   furosemide  40 mg Oral Daily   insulin aspart  0-15 Units Subcutaneous TID WC   insulin aspart  0-5 Units Subcutaneous QHS   losartan  100 mg Oral Daily   mouth rinse  15 mL Mouth Rinse q12n4p   melatonin  2.5 mg Oral QHS   metoprolol tartrate  50 mg Oral BID   potassium chloride  20 mEq Oral Daily   pravastatin  40 mg Oral q1800   sodium chloride flush  3 mL Intravenous Q12H   Continuous Infusions:     LOS: 4 days    Bonnielee Haff, MD [facility]  If 7PM-7AM, please contact night-coverage [email or web address]  01/17/2021, 10:34 AM

## 2021-01-18 LAB — GLUCOSE, CAPILLARY
Glucose-Capillary: 131 mg/dL — ABNORMAL HIGH (ref 70–99)
Glucose-Capillary: 139 mg/dL — ABNORMAL HIGH (ref 70–99)
Glucose-Capillary: 122 mg/dL — ABNORMAL HIGH (ref 70–99)
Glucose-Capillary: 182 mg/dL — ABNORMAL HIGH (ref 70–99)

## 2021-01-18 LAB — BASIC METABOLIC PANEL
Anion gap: 9 (ref 5–15)
BUN: 31 mg/dL — ABNORMAL HIGH (ref 8–23)
CO2: 30 mmol/L (ref 22–32)
Calcium: 8.4 mg/dL — ABNORMAL LOW (ref 8.9–10.3)
Chloride: 98 mmol/L (ref 98–111)
Creatinine, Ser: 0.84 mg/dL (ref 0.44–1.00)
GFR, Estimated: >60 mL/min (ref >60)
Glucose, Bld: 129 mg/dL — ABNORMAL HIGH (ref 70–99)
Potassium: 4.8 mmol/L (ref 3.5–5.1)
Sodium: 137 mmol/L (ref 135–145)

## 2021-01-18 NOTE — Progress Notes (Signed)
PROGRESS NOTE    Beth Franco  [id number] DOB: [birth year] DOA: 01/12/2021 PCP: Juline Patch, MD   Brief Narrative: Taken from H&P. Beth Franco is a 86 y.o. female with medical history significant of  HTN, DM type II, COPD, chronic respiratory failure with hypoxia requiring 2 to 3 L oxygen via nasal cannula at night only, CAD S/P stent, unspecified stroke with residual right lower extremity weakness and dyslipidiemia presented with worsening generalized weakness, inability to ambulate and falls.  She had 2 falls over 2 days.  At baseline patient has underlying dementia, lives with her daughter, able to move around with walker, able to perform simple ADLs and rest daughter helps.   Urine culture done on 01/11/2021 during initial ED visit was negative.  Blood cultures negative so far. COVID-19 and influenza PCR negative. CT head and cervical spine was without any acute abnormality. Chest x-ray with increased interstitial markings reflecting pulmonary edema, viral infection or combination along with right pleural effusion with adjacent airspace disease which could reflect pneumonia.  She was started on Rocephin and Zithromax.  Procalcitonin negative but she continued to spike fever.     Subjective: Patient offers no complaints.  No shortness of breath or chest pain.  No nausea vomiting.  Assessment & Plan:   Community-acquired pneumonia Initial films were not conclusive however repeat x-ray raised concern for viral pneumonia versus pulmonary edema.  Patient was placed on ceftriaxone and azithromycin.  Also placed on furosemide due to concern for pulmonary edema and elevated BNP.  WBC was noted to be normal.  Lactic acid level was normal.  COVID-19 and influenza PCR's were negative.  Procalcitonin was less than 0.1.   Echocardiogram showed normal systolic function with grade 1 diastolic dysfunction. Appears to be primarily a viral infection but she also received a course of antibacterials.   Has completed course.   Seen by speech therapy.  Aspiration precautions but no clear dysphagia noted on examination.  Remains on mechanical soft diet consistency. Stable from a respiratory standpoint.    Chronic respiratory failure with COPD   Patient is lifelong heavy smoker.  Uses 2 to 3 L of oxygen at night most of the time.  Continue with supplemental oxygen.  Occasional wheezes appreciated.  Bronchodilators as needed.   Stable for the most part.  Acute on chronic HFpEF/hypokalemia Echocardiogram with normal EF, grade 1 diastolic dysfunction and moderate to severe aortic stenosis.  BNP elevated at 1111.  Chest x-ray with concern of pulmonary edema.  Clinically appears euvolemic.  She was started on IV Lasix.  Changed over to oral furosemide.  Seems to be euvolemic.  Potassium has been corrected. Strict ins and outs and daily weights.  Moderate to severe aortic stenosis Detected on echocardiogram.  No previous echo reports available.  Currently asymptomatic.  Will benefit from being seen by cardiology in the outpatient setting for same.  Elevated troponin 247>>249.  Most likely secondary to demand ischemia.  Flat curve. No chest pain.  No further inpatient testing.  Type 2 diabetes mellitus.   CBGs are reasonably well controlled.  Continue SSI.  Normocytic anemia/B12 deficiency B12 noted to be 154.  Intramuscular supplementation for 5 days followed by oral supplementation.  Anemia panel showed ferritin to be 89, folic acid 30, TIBC [id number].    Essential hypertension Remains on losartan and metoprolol.  Monitor blood pressures closely.  Reasonably well controlled with occasional high readings.  Previous history of stroke with right-sided weakness Stable.  PT and OT evaluation.  Continue Plavix.  Dyslipidemia. -Continue statin  Generalized weakness associated with falls.  No acute injury -PT is recommending SNF -TOC consult  DVT prophylaxis: Lovenox Code Status: DNR Family  Communication: Daughter being updated periodically Disposition Plan: SNF when bed is available.  Patient is medically stable.  Status is: Inpatient  Remains inpatient appropriate because:Inpatient level of care appropriate due to severity of illness  Dispo: The patient is from: Home              Anticipated d/c is to: SNF              Patient currently is medically stable to d/c.   Difficult to place patient No              Level of care: Med-Surg   Objective: Vitals:   01/17/21 1632 01/17/21 1946 01/18/21 0806 01/18/21 1152  BP: (!) 152/49 (!) 151/53 (!) 156/56 (!) 151/55  Pulse: 81 88 84 61  Resp: '15 20 17 17  '$ Temp: 98.1 F (36.7 C) 99.5 F (37.5 C) 98.1 F (36.7 C) 98.3 F (36.8 C)  TempSrc:  Oral  Oral  SpO2: 99% 95% 97% 97%  Weight:      Height:        Intake/Output Summary (Last 24 hours) at 01/18/2021 1155 Last data filed at 01/18/2021 1100 Gross per 24 hour  Intake 363 ml  Output 1325 ml  Net -962 ml    Filed Weights   01/15/21 0500 01/16/21 0347 01/17/21 0600  Weight: 68.8 kg 71 kg 68.4 kg    Examination:  Patient is awake alert.  In no distress Lungs reveal coarse breath sound bilaterally.  No wheezing today.  Few crackles at the bases. S1-S2 is normal regular. Abdomen is soft.  Nontender nondistended. Right-sided weakness from previous stroke.      Consultants:  None  Procedures: None  Antimicrobials:  Anti-infectives (From admission, onward)    Start     Dose/Rate Route Frequency Ordered Stop   01/15/21 1400  azithromycin (ZITHROMAX) tablet 500 mg        500 mg Oral Daily 01/15/21 1236 01/16/21 0936   01/15/21 1330  cefdinir (OMNICEF) capsule 300 mg        300 mg Oral Every 12 hours 01/15/21 1236 01/17/21 2103   01/12/21 1700  cefTRIAXone (ROCEPHIN) 2 g in sodium chloride 0.9 % 100 mL IVPB  Status:  Discontinued        2 g 200 mL/hr over 30 Minutes Intravenous Every 24 hours 01/12/21 1648 01/15/21 1236   01/12/21 1700  azithromycin  (ZITHROMAX) 500 mg in sodium chloride 0.9 % 250 mL IVPB  Status:  Discontinued        500 mg 250 mL/hr over 60 Minutes Intravenous Every 24 hours 01/12/21 1648 01/15/21 1236   01/12/21 1630  cefTRIAXone (ROCEPHIN) 1 g in sodium chloride 0.9 % 100 mL IVPB        1 g 200 mL/hr over 30 Minutes Intravenous  Once 01/12/21 1621 01/12/21 1855   01/12/21 1630  azithromycin (ZITHROMAX) 500 mg in sodium chloride 0.9 % 250 mL IVPB        500 mg 250 mL/hr over 60 Minutes Intravenous  Once 01/12/21 1621 01/12/21 1855        Data Reviewed: I have personally reviewed following labs and imaging studies  CBC: Recent Labs  Lab 01/12/21 0002 01/12/21 1353 01/13/21 0636 01/15/21 0839  WBC 8.5 7.9 9.9 8.8  NEUTROABS 6.8  --   --   --  HGB 10.8* 11.0* 10.2* 10.2*  HCT 35.4* 34.1* 30.9* 32.2*  MCV 95.9 94.2 91.7 91.5  PLT 374 366 336 0000000    Basic Metabolic Panel: Recent Labs  Lab 01/13/21 0636 01/15/21 0839 01/16/21 0430 01/17/21 0415 01/18/21 0547  NA 135 138 138 140 137  K 3.6 3.8 3.4* 4.6 4.8  CL 97* 100 99 101 98  CO2 '28 28 31 '$ 33* 30  GLUCOSE 122* 184* 124* 132* 129*  BUN 17 32* 38* 33* 31*  CREATININE 0.85 0.85 0.90 0.81 0.84  CALCIUM 8.1* 7.9* 8.0* 8.3* 8.4*  MG  --  2.1  --   --   --     GFR: Estimated Creatinine Clearance: 48.5 mL/min (by C-G formula based on SCr of 0.84 mg/dL). Liver Function Tests: Recent Labs  Lab 01/12/21 0002 01/13/21 0636  AST 17 16  ALT 22 19  ALKPHOS 61 55  BILITOT 0.5 1.0  PROT 6.7 6.8  ALBUMIN 3.0* 3.0*    Coagulation Profile: Recent Labs  Lab 01/12/21 0002  INR 1.0    Cardiac Enzymes: Recent Labs  Lab 01/12/21 0002  CKTOTAL 31*    CBG: Recent Labs  Lab 01/17/21 1128 01/17/21 1659 01/17/21 2108 01/18/21 0809 01/18/21 1152  GLUCAP 201* 112* 175* 131* 139*     Sepsis Labs: Recent Labs  Lab 01/12/21 0002 01/12/21 1558 01/12/21 2211 01/13/21 0636 01/14/21 0817 01/15/21 0839  PROCALCITON  --   --   --  <0.10  <0.10 <0.10  LATICACIDVEN 1.4 0.7 0.9  --   --   --      Recent Results (from the past 240 hour(s))  Urine Culture     Status: None   Collection Time: 01/11/21 11:41 PM   Specimen: In/Out Cath Urine  Result Value Ref Range Status   Specimen Description IN/OUT CATH URINE  Final   Special Requests NONE  Final   Culture   Final    NO GROWTH Performed at [facility], [place] [address], [place], [place] [address]    Report Status 01/13/2021 FINAL  Final  Resp Panel by RT-PCR (Flu A&B, Covid) Nasopharyngeal Swab     Status: None   Collection Time: 01/12/21 12:02 AM   Specimen: Nasopharyngeal Swab; Nasopharyngeal(NP) swabs in vial transport medium  Result Value Ref Range Status   SARS Coronavirus 2 by RT PCR NEGATIVE NEGATIVE Final    Comment: (NOTE) SARS-CoV-2 target nucleic acids are NOT DETECTED.  The SARS-CoV-2 RNA is generally detectable in upper respiratory specimens during the acute phase of infection. The lowest concentration of SARS-CoV-2 viral copies this assay can detect is 138 copies/mL. A negative result does not preclude SARS-Cov-2 infection and should not be used as the sole basis for treatment or other patient management decisions. A negative result may occur with  improper specimen collection/handling, submission of specimen other than nasopharyngeal swab, presence of viral mutation(s) within the areas targeted by this assay, and inadequate number of viral copies(<138 copies/mL). A negative result must be combined with clinical observations, patient history, and epidemiological information. The expected result is Negative.  Fact Sheet for Patients:  EntrepreneurPulse.com.au  Fact Sheet for Healthcare Providers:  IncredibleEmployment.be  This test is no t yet approved or cleared by the [place] FDA and  has been authorized for detection and/or diagnosis of SARS-CoV-2 by FDA under an Emergency Use Authorization (EUA).  This EUA will remain  in effect (meaning this test can be used) for the duration of the COVID-19  declaration under Section 564(b)(1) of the Act, 21 U.S.C.section 360bbb-3(b)(1), unless the authorization is terminated  or revoked sooner.       Influenza A by PCR NEGATIVE NEGATIVE Final   Influenza B by PCR NEGATIVE NEGATIVE Final    Comment: (NOTE) The Xpert Xpress SARS-CoV-2/FLU/RSV plus assay is intended as an aid in the diagnosis of influenza from Nasopharyngeal swab specimens and should not be used as a sole basis for treatment. Nasal washings and aspirates are unacceptable for Xpert Xpress SARS-CoV-2/FLU/RSV testing.  Fact Sheet for Patients: EntrepreneurPulse.com.au  Fact Sheet for Healthcare Providers: IncredibleEmployment.be  This test is not yet approved or cleared by the [place] FDA and has been authorized for detection and/or diagnosis of SARS-CoV-2 by FDA under an Emergency Use Authorization (EUA). This EUA will remain in effect (meaning this test can be used) for the duration of the COVID-19 declaration under Section 564(b)(1) of the Act, 21 U.S.C. section 360bbb-3(b)(1), unless the authorization is terminated or revoked.  Performed at [facility], [place] [address] [place], [place] [address]   Blood Culture (routine x 2)     Status: None   Collection Time: 01/12/21 12:02 AM   Specimen: BLOOD  Result Value Ref Range Status   Specimen Description BLOOD RIGHT ANTECUBITAL  Final   Special Requests   Final    BOTTLES DRAWN AEROBIC AND ANAEROBIC Blood Culture adequate volume   Culture   Final    NO GROWTH 5 DAYS Performed at [facility], [place] [address] [place], [place] [address]    Report Status 01/17/2021 FINAL  Final  Blood Culture (routine x 2)     Status: None   Collection Time: 01/12/21 12:07 AM   Specimen: BLOOD  Result Value Ref Range Status   Specimen Description BLOOD SITE NOT SPECIFIED  Final    Special Requests   Final    BOTTLES DRAWN AEROBIC AND ANAEROBIC Blood Culture results may not be optimal due to an inadequate volume of blood received in culture bottles   Culture   Final    NO GROWTH 5 DAYS Performed at [facility], [place] [address] [place], [place] [address]    Report Status 01/17/2021 FINAL  Final  Resp Panel by RT-PCR (Flu A&B, Covid) Nasopharyngeal Swab     Status: None   Collection Time: 01/12/21  1:53 PM   Specimen: Nasopharyngeal Swab; Nasopharyngeal(NP) swabs in vial transport medium  Result Value Ref Range Status   SARS Coronavirus 2 by RT PCR NEGATIVE NEGATIVE Final    Comment: (NOTE) SARS-CoV-2 target nucleic acids are NOT DETECTED.  The SARS-CoV-2 RNA is generally detectable in upper respiratory specimens during the acute phase of infection. The lowest concentration of SARS-CoV-2 viral copies this assay can detect is 138 copies/mL. A negative result does not preclude SARS-Cov-2 infection and should not be used as the sole basis for treatment or other patient management decisions. A negative result may occur with  improper specimen collection/handling, submission of specimen other than nasopharyngeal swab, presence of viral mutation(s) within the areas targeted by this assay, and inadequate number of viral copies(<138 copies/mL). A negative result must be combined with clinical observations, patient history, and epidemiological information. The expected result is Negative.  Fact Sheet for Patients:  EntrepreneurPulse.com.au  Fact Sheet for Healthcare Providers:  IncredibleEmployment.be  This test is no t yet approved or cleared by the [place] FDA and  has been authorized for detection and/or diagnosis of SARS-CoV-2 by FDA  under an Emergency Use Authorization (EUA). This EUA will remain  in effect (meaning this test can be used) for the duration of the COVID-19 declaration under Section 564(b)(1) of the  Act, 21 U.S.C.section 360bbb-3(b)(1), unless the authorization is terminated  or revoked sooner.       Influenza A by PCR NEGATIVE NEGATIVE Final   Influenza B by PCR NEGATIVE NEGATIVE Final    Comment: (NOTE) The Xpert Xpress SARS-CoV-2/FLU/RSV plus assay is intended as an aid in the diagnosis of influenza from Nasopharyngeal swab specimens and should not be used as a sole basis for treatment. Nasal washings and aspirates are unacceptable for Xpert Xpress SARS-CoV-2/FLU/RSV testing.  Fact Sheet for Patients: EntrepreneurPulse.com.au  Fact Sheet for Healthcare Providers: IncredibleEmployment.be  This test is not yet approved or cleared by the [place] FDA and has been authorized for detection and/or diagnosis of SARS-CoV-2 by FDA under an Emergency Use Authorization (EUA). This EUA will remain in effect (meaning this test can be used) for the duration of the COVID-19 declaration under Section 564(b)(1) of the Act, 21 U.S.C. section 360bbb-3(b)(1), unless the authorization is terminated or revoked.  Performed at [facility], [address] [place], [place] [address]   Blood culture (routine x 2)     Status: None   Collection Time: 01/12/21  3:58 PM   Specimen: BLOOD  Result Value Ref Range Status   Specimen Description BLOOD BLOOD LEFT WRIST  Final   Special Requests   Final    BOTTLES DRAWN AEROBIC AND ANAEROBIC Blood Culture adequate volume   Culture   Final    NO GROWTH 5 DAYS Performed at [facility], [address], [place], [place] [address]    Report Status 01/17/2021 FINAL  Final  Blood culture (routine x 2)     Status: None   Collection Time: 01/12/21  3:58 PM   Specimen: BLOOD  Result Value Ref Range Status   Specimen Description BLOOD LEFT ANTECUBITAL  Final   Special Requests   Final    BOTTLES DRAWN AEROBIC AND ANAEROBIC Blood Culture adequate volume   Culture   Final    NO GROWTH 5  DAYS Performed at [facility], [address] [place], [place] [address]    Report Status 01/17/2021 FINAL  Final      Radiology Studies: No results found.  Scheduled Meds:  chlorhexidine  15 mL Mouth Rinse BID   clopidogrel  75 mg Oral Daily   enoxaparin (LOVENOX) injection  40 mg Subcutaneous Q24H   feeding supplement (GLUCERNA SHAKE)  237 mL Oral TID BM   ferrous sulfate  325 mg Oral Q breakfast   furosemide  40 mg Oral Daily   insulin aspart  0-15 Units Subcutaneous TID WC   insulin aspart  0-5 Units Subcutaneous QHS   losartan  100 mg Oral Daily   mouth rinse  15 mL Mouth Rinse q12n4p   melatonin  2.5 mg Oral QHS   metoprolol tartrate  50 mg Oral BID   potassium chloride  20 mEq Oral Daily   pravastatin  40 mg Oral q1800   sodium chloride flush  3 mL Intravenous Q12H   [START ON 01/19/2021] vitamin B-12  1,000 mcg Oral Daily   Continuous Infusions:     LOS: 5 days    Bonnielee Haff, MD [facility]  If 7PM-7AM, please contact night-coverage [email or web address]  01/18/2021, 11:55 AM

## 2021-01-19 DIAGNOSIS — J189 Pneumonia, unspecified organism: Secondary | ICD-10-CM | POA: Diagnosis not present

## 2021-01-19 LAB — RESP PANEL BY RT-PCR (FLU A&B, COVID) ARPGX2
Influenza A by PCR: NEGATIVE
Influenza B by PCR: NEGATIVE
SARS Coronavirus 2 by RT PCR: NEGATIVE

## 2021-01-19 LAB — GLUCOSE, CAPILLARY
Glucose-Capillary: 138 mg/dL — ABNORMAL HIGH (ref 70–99)
Glucose-Capillary: 164 mg/dL — ABNORMAL HIGH (ref 70–99)

## 2021-01-19 MED ORDER — POTASSIUM CHLORIDE CRYS ER 10 MEQ PO TBCR: 10.0000 meq | EXTENDED_RELEASE_TABLET | Freq: Every day | ORAL | Status: DC

## 2021-01-19 MED ORDER — POLYETHYLENE GLYCOL 3350 17 G PO PACK: 17.0000 g | PACK | Freq: Every day | ORAL | 0 refills | Status: DC

## 2021-01-19 MED ORDER — POLYETHYLENE GLYCOL 3350 17 G PO PACK
17.0000 g | PACK | Freq: Every day | ORAL | Status: DC
  Administered 2021-01-19: 17 g via ORAL
  Filled 2021-01-19: qty 1

## 2021-01-19 MED ORDER — CYANOCOBALAMIN 1000 MCG PO TABS: 1000.0000 ug | ORAL_TABLET | Freq: Every day | ORAL | Status: DC

## 2021-01-19 MED ORDER — GLUCERNA SHAKE PO LIQD: 237.0000 mL | Freq: Three times a day (TID) | ORAL | 0 refills | Status: DC

## 2021-01-19 MED ORDER — SENNOSIDES-DOCUSATE SODIUM 8.6-50 MG PO TABS: 1.0000 | ORAL_TABLET | Freq: Every evening | ORAL | Status: DC | PRN

## 2021-01-19 MED ORDER — FUROSEMIDE 40 MG PO TABS: 20.0000 mg | ORAL_TABLET | Freq: Every day | ORAL | Status: DC

## 2021-01-19 NOTE — TOC Transition Note (Signed)
Transition of Care [facility]) - CM/SW Discharge Note   Patient Details  Name: Beth Franco MRN: [id number] Date of Birth: 1934/07/21  [facility] of [facility]) CM/SW Contact:  Eileen Stanford, LCSW Phone Number: 01/19/2021, 3:11 PM   Clinical Narrative:   Clinical Social Worker facilitated patient discharge including contacting patient family and facility to confirm patient discharge plans.  Clinical information faxed to facility and family agreeable with plan.  CSW arranged ambulance transport via [facility] to [facility].  RN to call [phone] for report prior to discharge.       Final next level of care: Skilled Nursing Facility Barriers to Discharge: No Barriers Identified   Patient Goals and CMS Choice   CMS Medicare.gov Compare Post Acute Care list provided to:: Patient Represenative (must comment) (daughter [name]) Choice offered to / list presented to : Adult Children  Discharge Placement              Patient chooses bed at:  [facility]) Patient to be transferred to facility by: [facility] Name of family member notified: [name] Patient and family notified of of transfer: 01/19/21  Discharge Plan and Services     Post Acute Care Choice: [facility]                               Social Determinants of Health (SDOH) Interventions     Readmission Risk Interventions No flowsheet data found.

## 2021-01-19 NOTE — Discharge Summary (Addendum)
[facility]  Physician Discharge Summary   Patient ID: Beth Franco MRN: [id number] DOB/AGE: [id number] 84 y.o.  Admit date: 01/12/2021 Discharge date:   01/19/2021   PCP: Juline Patch, MD  DISCHARGE DIAGNOSES:  Community-acquired pneumonia Chronic respiratory failure with hypoxia/history of COPD Acute on chronic diastolic CHF Moderate to severe aortic stenosis Diabetes mellitus type 2, controlled Vitamin B12 deficiency Normocytic anemia Essential hypertension Previous history of stroke with right-sided weakness   RECOMMENDATIONS FOR OUTPATIENT FOLLOW UP: Patient will need to be established with a cardiologist for her aortic stenosis. Please check CBC and basic metabolic panel in 2 to 3 days to check electrolytes and hemoglobin   Home Health: Going to SNF Equipment/Devices: None  CODE STATUS: DNR  DISCHARGE CONDITION: fair  Diet recommendation: Dysphagia 3 diet with thin liquids  INITIAL HISTORY: Beth Franco is a 86 y.o. female with medical history significant of  HTN, DM type II, COPD, chronic respiratory failure with hypoxia requiring 2 to 3 L oxygen via nasal cannula at night only, CAD S/P stent, unspecified stroke with residual right lower extremity weakness and dyslipidiemia presented with worsening generalized weakness, inability to ambulate and falls.  She had 2 falls over 2 days.  At baseline patient has underlying dementia, lives with her daughter, able to move around with walker, able to perform simple ADLs and rest daughter helps.   Urine culture done on 01/11/2021 during initial ED visit was negative.  Blood cultures negative so far. COVID-19 and influenza PCR negative. CT head and cervical spine was without any acute abnormality. Chest x-ray with increased interstitial markings reflecting pulmonary edema, viral infection or combination along with right pleural effusion with adjacent airspace disease which could reflect pneumonia.  She was started on  Rocephin and Zithromax.  Procalcitonin negative but she continued to spike fever.     HOSPITAL COURSE:   Community-acquired pneumonia Initial films were not conclusive however repeat x-ray raised concern for viral pneumonia versus pulmonary edema.  Patient was placed on ceftriaxone and azithromycin.  Also placed on furosemide due to concern for pulmonary edema and elevated BNP.  WBC was noted to be normal.  Lactic acid level was normal.  COVID-19 and influenza PCR's were negative.  Procalcitonin was less than 0.1.   Echocardiogram showed normal systolic function with grade 1 diastolic dysfunction. Appears to be primarily a viral infection but she also received a course of antibacterials.  Has completed course.   Seen by speech therapy.  Aspiration precautions but no clear dysphagia noted on examination.  Remains on mechanical soft diet consistency. Remains stable from a respiratory standpoint   Chronic toxic respiratory failure/COPD   Patient is lifelong heavy smoker.  Uses 2 to 3 L of oxygen at night most of the time.  Continue with supplemental oxygen.  Occasional wheezes appreciated.  Bronchodilators as needed.   Stable for the most part.   Acute on chronic HFpEF/hypokalemia Echocardiogram with normal EF, grade 1 diastolic dysfunction and moderate to severe aortic stenosis.  BNP elevated at 1111.  Chest x-ray with concern of pulmonary edema.  Clinically appears euvolemic.  She was started on IV Lasix.  Changed over to oral furosemide.  Seems to be euvolemic.  Potassium has been corrected.  Check electrolytes periodically at the skilled nursing facility.  Monitor weights frequently.   Moderate to severe aortic stenosis Detected on echocardiogram.  No previous echo reports available.  Currently asymptomatic.  Will benefit from being seen by cardiology in the outpatient setting for same.  This can be arranged by providers at [facility].   Elevated troponin 247>>249.  Most likely secondary to demand  ischemia.  Flat curve. No chest pain.  No further inpatient testing.   Type 2 diabetes mellitus.   Continue to monitor CBGs.  Continue metformin.   Normocytic anemia/B12 deficiency B12 noted to be 154.  Intramuscular supplementation for 5 days followed by oral supplementation.  Anemia panel showed ferritin to be 89, folic acid 30, TIBC 829.     Essential hypertension Remains on losartan and metoprolol.     Previous history of stroke with right-sided weakness Stable.  PT and OT evaluation.  Continue Plavix and aspirin   Dyslipidemia. -Continue statin   Generalized weakness associated with falls.  No acute injury -PT is recommending SNF -TOC consult  Patient is stable.  Okay for discharge to SNF today for short-term rehab.  PERTINENT LABS:  The results of significant diagnostics from this hospitalization (including imaging, microbiology, ancillary and laboratory) are listed below for reference.    Microbiology: Recent Results (from the past 240 hour(s))  Urine Culture     Status: None   Collection Time: 01/11/21 11:41 PM   Specimen: In/Out Cath Urine  Result Value Ref Range Status   Specimen Description IN/OUT CATH URINE  Final   Special Requests NONE  Final   Culture   Final    NO GROWTH Performed at [facility], [address], [place], [place] [address]    Report Status 01/13/2021 FINAL  Final  Resp Panel by RT-PCR (Flu A&B, Covid) Nasopharyngeal Swab     Status: None   Collection Time: 01/12/21 12:02 AM   Specimen: Nasopharyngeal Swab; Nasopharyngeal(NP) swabs in vial transport medium  Result Value Ref Range Status   SARS Coronavirus 2 by RT PCR NEGATIVE NEGATIVE Final    Comment: (NOTE) SARS-CoV-2 target nucleic acids are NOT DETECTED.  The SARS-CoV-2 RNA is generally detectable in upper respiratory specimens during the acute phase of infection. The lowest concentration of SARS-CoV-2 viral copies this assay can detect is 138 copies/mL. A negative result  does not preclude SARS-Cov-2 infection and should not be used as the sole basis for treatment or other patient management decisions. A negative result may occur with  improper specimen collection/handling, submission of specimen other than nasopharyngeal swab, presence of viral mutation(s) within the areas targeted by this assay, and inadequate number of viral copies(<138 copies/mL). A negative result must be combined with clinical observations, patient history, and epidemiological information. The expected result is Negative.  Fact Sheet for Patients:  EntrepreneurPulse.com.au  Fact Sheet for Healthcare Providers:  IncredibleEmployment.be  This test is no t yet approved or cleared by the [place] FDA and  has been authorized for detection and/or diagnosis of SARS-CoV-2 by FDA under an Emergency Use Authorization (EUA). This EUA will remain  in effect (meaning this test can be used) for the duration of the COVID-19 declaration under Section 564(b)(1) of the Act, 21 U.S.C.section 360bbb-3(b)(1), unless the authorization is terminated  or revoked sooner.       Influenza A by PCR NEGATIVE NEGATIVE Final   Influenza B by PCR NEGATIVE NEGATIVE Final    Comment: (NOTE) The Xpert Xpress SARS-CoV-2/FLU/RSV plus assay is intended as an aid in the diagnosis of influenza from Nasopharyngeal swab specimens and should not be used as a sole basis for treatment. Nasal washings and aspirates are unacceptable for Xpert Xpress SARS-CoV-2/FLU/RSV testing.  Fact Sheet for Patients: EntrepreneurPulse.com.au  Fact Sheet for Healthcare  Providers: IncredibleEmployment.be  This test is not yet approved or cleared by the [place] and has been authorized for detection and/or diagnosis of SARS-CoV-2 by FDA under an Emergency Use Authorization (EUA). This EUA will remain in effect (meaning this test can be used) for  the duration of the COVID-19 declaration under Section 564(b)(1) of the Act, 21 U.S.C. section 360bbb-3(b)(1), unless the authorization is terminated or revoked.  Performed at [facility], [place] [address] [place], [place] [address]   Blood Culture (routine x 2)     Status: None   Collection Time: 01/12/21 12:02 AM   Specimen: BLOOD  Result Value Ref Range Status   Specimen Description BLOOD RIGHT ANTECUBITAL  Final   Special Requests   Final    BOTTLES DRAWN AEROBIC AND ANAEROBIC Blood Culture adequate volume   Culture   Final    NO GROWTH 5 DAYS Performed at [facility], [place] [address], [place], [place] [address]    Report Status 01/17/2021 FINAL  Final  Blood Culture (routine x 2)     Status: None   Collection Time: 01/12/21 12:07 AM   Specimen: BLOOD  Result Value Ref Range Status   Specimen Description BLOOD SITE NOT SPECIFIED  Final   Special Requests   Final    BOTTLES DRAWN AEROBIC AND ANAEROBIC Blood Culture results may not be optimal due to an inadequate volume of blood received in culture bottles   Culture   Final    NO GROWTH 5 DAYS Performed at [facility], [place] [address], [place], [place] [address]    Report Status 01/17/2021 FINAL  Final  Resp Panel by RT-PCR (Flu A&B, Covid) Nasopharyngeal Swab     Status: None   Collection Time: 01/12/21  1:53 PM   Specimen: Nasopharyngeal Swab; Nasopharyngeal(NP) swabs in vial transport medium  Result Value Ref Range Status   SARS Coronavirus 2 by RT PCR NEGATIVE NEGATIVE Final    Comment: (NOTE) SARS-CoV-2 target nucleic acids are NOT DETECTED.  The SARS-CoV-2 RNA is generally detectable in upper respiratory specimens during the acute phase of infection. The lowest concentration of SARS-CoV-2 viral copies this assay can detect is 138 copies/mL. A negative result does not preclude SARS-Cov-2 infection and should not be used as the sole basis for treatment or other patient management decisions. A  negative result may occur with  improper specimen collection/handling, submission of specimen other than nasopharyngeal swab, presence of viral mutation(s) within the areas targeted by this assay, and inadequate number of viral copies(<138 copies/mL). A negative result must be combined with clinical observations, patient history, and epidemiological information. The expected result is Negative.  Fact Sheet for Patients:  EntrepreneurPulse.com.au  Fact Sheet for Healthcare Providers:  IncredibleEmployment.be  This test is no t yet approved or cleared by the [place] FDA and  has been authorized for detection and/or diagnosis of SARS-CoV-2 by FDA under an Emergency Use Authorization (EUA). This EUA will remain  in effect (meaning this test can be used) for the duration of the COVID-19 declaration under Section 564(b)(1) of the Act, 21 U.S.C.section 360bbb-3(b)(1), unless the authorization is terminated  or revoked sooner.       Influenza A by PCR NEGATIVE NEGATIVE Final   Influenza B by PCR NEGATIVE NEGATIVE Final    Comment: (NOTE) The Xpert Xpress SARS-CoV-2/FLU/RSV plus assay is intended as an aid in the diagnosis of influenza from Nasopharyngeal swab specimens and should not be used as a  sole basis for treatment. Nasal washings and aspirates are unacceptable for Xpert Xpress SARS-CoV-2/FLU/RSV testing.  Fact Sheet for Patients: EntrepreneurPulse.com.au  Fact Sheet for Healthcare Providers: IncredibleEmployment.be  This test is not yet approved or cleared by the [place] FDA and has been authorized for detection and/or diagnosis of SARS-CoV-2 by FDA under an Emergency Use Authorization (EUA). This EUA will remain in effect (meaning this test can be used) for the duration of the COVID-19 declaration under Section 564(b)(1) of the Act, 21 U.S.C. section 360bbb-3(b)(1), unless the authorization  is terminated or revoked.  Performed at [facility], [address] [place], [place] [address]   Blood culture (routine x 2)     Status: None   Collection Time: 01/12/21  3:58 PM   Specimen: BLOOD  Result Value Ref Range Status   Specimen Description BLOOD BLOOD LEFT WRIST  Final   Special Requests   Final    BOTTLES DRAWN AEROBIC AND ANAEROBIC Blood Culture adequate volume   Culture   Final    NO GROWTH 5 DAYS Performed at [facility], [address] [place], [place] [address]    Report Status 01/17/2021 FINAL  Final  Blood culture (routine x 2)     Status: None   Collection Time: 01/12/21  3:58 PM   Specimen: BLOOD  Result Value Ref Range Status   Specimen Description BLOOD LEFT ANTECUBITAL  Final   Special Requests   Final    BOTTLES DRAWN AEROBIC AND ANAEROBIC Blood Culture adequate volume   Culture   Final    NO GROWTH 5 DAYS Performed at [facility], [address] [place], [place] [address]    Report Status 01/17/2021 FINAL  Final     Labs:  COVID-19 Labs   Lab Results  Component Value Date   [place] NEGATIVE 01/12/2021   [place] NEGATIVE 01/12/2021      Basic Metabolic Panel: Recent Labs  Lab 01/13/21 0636 01/15/21 0839 01/16/21 0430 01/17/21 0415 01/18/21 0547  NA 135 138 138 140 137  K 3.6 3.8 3.4* 4.6 4.8  CL 97* 100 99 101 98  CO2 28 28 31  33* 30  GLUCOSE 122* 184* 124* 132* 129*  BUN 17 32* 38* 33* 31*  CREATININE 0.85 0.85 0.90 0.81 0.84  CALCIUM 8.1* 7.9* 8.0* 8.3* 8.4*  MG  --  2.1  --   --   --    Liver Function Tests: Recent Labs  Lab 01/13/21 0636  AST 16  ALT 19  ALKPHOS 55  BILITOT 1.0  PROT 6.8  ALBUMIN 3.0*    CBC: Recent Labs  Lab 01/12/21 1353 01/13/21 0636 01/15/21 0839  WBC 7.9 9.9 8.8  HGB 11.0* 10.2* 10.2*  HCT 34.1* 30.9* 32.2*  MCV 94.2 91.7 91.5  PLT 366 336 370    BNP: BNP (last 3 results) Recent Labs    01/12/21 0133 01/12/21 1353  BNP 634.2*  1,111.3*     CBG: Recent Labs  Lab 01/18/21 0809 01/18/21 1152 01/18/21 1624 01/18/21 2104 01/19/21 0805  GLUCAP 131* 139* 122* 182* 138*     IMAGING STUDIES CT HEAD WO CONTRAST (5MM)  Result Date: 01/12/2021 CLINICAL DATA:  Found down, confusion EXAM: CT HEAD WITHOUT CONTRAST CT CERVICAL SPINE WITHOUT CONTRAST TECHNIQUE: Multidetector CT imaging of the head and cervical spine was performed following the standard protocol without intravenous contrast. Multiplanar CT image reconstructions of the cervical spine were also generated. COMPARISON:  None. FINDINGS: CT HEAD FINDINGS Brain:  No evidence of acute infarction, hemorrhage, hydrocephalus, extra-axial collection or mass lesion/mass effect. Subcortical white matter and periventricular small vessel ischemic changes. Vascular: Intracranial atherosclerosis. Skull: Normal. Negative for fracture or focal lesion. Sinuses/Orbits: The visualized paranasal sinuses are essentially clear. The mastoid air cells are unopacified. Other: None. CT CERVICAL SPINE FINDINGS Alignment: Normal cervical lordosis. Skull base and vertebrae: No acute fracture. No primary bone lesion or focal pathologic process. Soft tissues and spinal canal: No prevertebral fluid or swelling. No visible canal hematoma. Disc levels: Mild degenerative changes of the mid cervical spine. Spinal canal is patent. Upper chest: Visualized lung apices are notable for layering small pleural effusions. Other: None. IMPRESSION: No evidence of acute intracranial abnormality. Small vessel ischemic changes. No evidence of traumatic injury to the cervical spine. Mild degenerative changes. Small layering pleural effusions. Electronically Signed   By: Julian Hy M.D.   On: 01/12/2021 01:00   CT Cervical Spine Wo Contrast  Result Date: 01/12/2021 CLINICAL DATA:  Found down, confusion EXAM: CT HEAD WITHOUT CONTRAST CT CERVICAL SPINE WITHOUT CONTRAST TECHNIQUE: Multidetector CT imaging of the head  and cervical spine was performed following the standard protocol without intravenous contrast. Multiplanar CT image reconstructions of the cervical spine were also generated. COMPARISON:  None. FINDINGS: CT HEAD FINDINGS Brain: No evidence of acute infarction, hemorrhage, hydrocephalus, extra-axial collection or mass lesion/mass effect. Subcortical white matter and periventricular small vessel ischemic changes. Vascular: Intracranial atherosclerosis. Skull: Normal. Negative for fracture or focal lesion. Sinuses/Orbits: The visualized paranasal sinuses are essentially clear. The mastoid air cells are unopacified. Other: None. CT CERVICAL SPINE FINDINGS Alignment: Normal cervical lordosis. Skull base and vertebrae: No acute fracture. No primary bone lesion or focal pathologic process. Soft tissues and spinal canal: No prevertebral fluid or swelling. No visible canal hematoma. Disc levels: Mild degenerative changes of the mid cervical spine. Spinal canal is patent. Upper chest: Visualized lung apices are notable for layering small pleural effusions. Other: None. IMPRESSION: No evidence of acute intracranial abnormality. Small vessel ischemic changes. No evidence of traumatic injury to the cervical spine. Mild degenerative changes. Small layering pleural effusions. Electronically Signed   By: Julian Hy M.D.   On: 01/12/2021 01:00   DG Chest Portable 1 View  Result Date: 01/12/2021 CLINICAL DATA:  Fever EXAM: PORTABLE CHEST 1 VIEW COMPARISON:  Chest radiograph 01/13/2011 FINDINGS: The heart is enlarged, unchanged. The mediastinal contours are stable. There are patchy interstitial markings throughout both lungs. There is increased density in the right base with a new small right pleural effusion. There is no significant left effusion. There is no pneumothorax. There is no acute osseous abnormality. IMPRESSION: 1. Increased interstitial markings throughout both lungs may reflect pulmonary edema, viral  infection, or combination. 2. Increased conspicuity of a small right pleural effusion with adjacent airspace disease which could reflect pneumonia in the correct clinical setting. 3. Cardiomegaly. Electronically Signed   By: Valetta Mole M.D.   On: 01/12/2021 16:05   DG Chest Port 1 View  Result Date: 01/12/2021 CLINICAL DATA:  Found down, confusion EXAM: PORTABLE CHEST 1 VIEW COMPARISON:  None. FINDINGS: Increased interstitial markings. No definite pleural effusions. No pneumothorax. Cardiomegaly.  Thoracic aortic atherosclerosis. IMPRESSION: Cardiomegaly with suspected mild interstitial edema. No definite pleural effusions. Electronically Signed   By: Julian Hy M.D.   On: 01/12/2021 00:49   ECHOCARDIOGRAM COMPLETE  Result Date: 01/13/2021    ECHOCARDIOGRAM REPORT   Patient Name:   DYANI BABEL Date of Exam: 01/13/2021 Medical Rec #:  [id number]    Height:       65.0 in Accession #:    [id number]   Weight:       149.0 lb Date of Birth:  01/19/1935    BSA:          1.746 m Patient Age:    41 years     BP:           161/52 mmHg Patient Gender: F            HR:           101 bpm. Exam Location:  [facility] Procedure: 2D Echo, Color Doppler, Cardiac Doppler and Intracardiac            Opacification Agent Indications:     R06.00 Dyspnea  History:         Patient has no prior history of Echocardiogram examinations.                  CAD, Stroke and COPD; Risk Factors:Hypertension, Diabetes and                  HCL.  Sonographer:     Charmayne Sheer Referring Phys:  [id number] Aline August Diagnosing Phys: Nelva Bush MD  Sonographer Comments: Technically difficult study due to poor echo windows. IMPRESSIONS  1. Left ventricular ejection fraction, by estimation, is 55 to 60%. The left ventricle has normal function. Left ventricular endocardial border not optimally defined to evaluate regional wall motion. The left ventricular internal cavity size was mildly dilated. There is mild left ventricular hypertrophy. Left  ventricular diastolic parameters are consistent with Grade I diastolic dysfunction (impaired relaxation). Elevated left atrial pressure.  2. Right ventricular systolic function is normal. The right ventricular size is normal. Tricuspid regurgitation signal is inadequate for assessing PA pressure.  3. The mitral valve was not well visualized. No evidence of mitral valve regurgitation. No evidence of mitral stenosis.  4. The aortic valve has an indeterminant number of cusps. There is moderate calcification of the aortic valve. There is moderate thickening of the aortic valve. Aortic valve regurgitation is not visualized. Moderate to severe aortic valve stenosis. Aortic valve area, by VTI measures 1.11 cm. Aortic valve mean gradient measures 14.0 mmHg.  5. The inferior vena cava is normal in size with <50% respiratory variability, suggesting right atrial pressure of 8 mmHg. FINDINGS  Left Ventricle: Left ventricular ejection fraction, by estimation, is 55 to 60%. The left ventricle has normal function. Left ventricular endocardial border not optimally defined to evaluate regional wall motion. Definity contrast agent was given IV to delineate the left ventricular endocardial borders. The left ventricular internal cavity size was mildly dilated. There is mild left ventricular hypertrophy. Left ventricular diastolic parameters are consistent with Grade I diastolic dysfunction (impaired relaxation). Elevated left atrial pressure. Right Ventricle: The right ventricular size is normal. No increase in right ventricular wall thickness. Right ventricular systolic function is normal. Tricuspid regurgitation signal is inadequate for assessing PA pressure. Left Atrium: Left atrial size was normal in size. Right Atrium: Right atrial size was normal in size. Pericardium: There is no evidence of pericardial effusion. Mitral Valve: The mitral valve was not well visualized. There is mild thickening of the mitral valve leaflet(s).  There is mild calcification of the mitral valve leaflet(s). Mild to moderate mitral annular calcification. No evidence of mitral valve regurgitation. No evidence of mitral valve stenosis. MV peak gradient, 3.7 mmHg. The mean mitral valve gradient is 1.0 mmHg. Tricuspid  Valve: The tricuspid valve is not well visualized. Tricuspid valve regurgitation is trivial. Aortic Valve: The aortic valve has an indeterminant number of cusps. There is moderate calcification of the aortic valve. There is moderate thickening of the aortic valve. Aortic valve regurgitation is not visualized. Moderate to severe aortic stenosis is present. Aortic valve mean gradient measures 14.0 mmHg. Aortic valve peak gradient measures 22.7 mmHg. Aortic valve area, by VTI measures 1.11 cm. Pulmonic Valve: The pulmonic valve was not well visualized. Pulmonic valve regurgitation is not visualized. No evidence of pulmonic stenosis. Aorta: The aortic root is normal in size and structure. Pulmonary Artery: The pulmonary artery is of normal size. Venous: The inferior vena cava is normal in size with less than 50% respiratory variability, suggesting right atrial pressure of 8 mmHg. IAS/Shunts: The interatrial septum was not well visualized.  LEFT VENTRICLE PLAX 2D LVIDd:         5.20 cm     Diastology LVIDs:         4.60 cm     LV e' medial:    3.48 cm/s LV PW:         1.10 cm     LV E/e' medial:  24.6 LV IVS:        0.80 cm     LV e' lateral:   6.53 cm/s LVOT diam:     2.10 cm     LV E/e' lateral: 13.1 LV SV:         50 LV SV Index:   29 LVOT Area:     3.46 cm  LV Volumes (MOD) LV vol d, MOD A2C: 84.7 ml LV vol d, MOD A4C: 84.4 ml LV vol s, MOD A2C: 57.0 ml LV vol s, MOD A4C: 56.4 ml LV SV MOD A2C:     27.7 ml LV SV MOD A4C:     84.4 ml LV SV MOD BP:      30.0 ml RIGHT VENTRICLE RV Basal diam:  3.20 cm LEFT ATRIUM             Index       RIGHT ATRIUM           Index LA diam:        3.60 cm 2.06 cm/m  RA Area:     17.70 cm LA Vol (A2C):   51.3 ml 29.39  ml/m RA Volume:   49.10 ml  28.13 ml/m LA Vol (A4C):   48.5 ml 27.78 ml/m LA Biplane Vol: 50.1 ml 28.70 ml/m  AORTIC VALVE                    PULMONIC VALVE AV Area (Vmax):    1.23 cm     PV Vmax:       1.03 m/s AV Area (Vmean):   1.12 cm     PV Vmean:      79.700 cm/s AV Area (VTI):     1.11 cm     PV VTI:        0.209 m AV Vmax:           238.00 cm/s  PV Peak grad:  4.2 mmHg AV Vmean:          174.500 cm/s PV Mean grad:  3.0 mmHg AV VTI:            0.450 m AV Peak Grad:      22.7 mmHg AV Mean Grad:      14.0 mmHg LVOT  Vmax:         84.20 cm/s LVOT Vmean:        56.200 cm/s LVOT VTI:          0.144 m LVOT/AV VTI ratio: 0.32  AORTA Ao Root diam: 3.20 cm MITRAL VALVE MV Area (PHT): 3.79 cm    SHUNTS MV Area VTI:   2.45 cm    Systemic VTI:  0.14 m MV Peak grad:  3.7 mmHg    Systemic Diam: 2.10 cm MV Mean grad:  1.0 mmHg MV Vmax:       0.97 m/s MV Vmean:      50.5 cm/s MV Decel Time: 200 msec MV E velocity: 85.70 cm/s MV A velocity: 95.10 cm/s MV E/A ratio:  0.90 Harrell Gave End MD Electronically signed by Nelva Bush MD Signature Date/Time: 01/13/2021/1:45:20 PM    Final     DISCHARGE EXAMINATION: Vitals:   01/18/21 2007 01/19/21 0504 01/19/21 0529 01/19/21 0803  BP: (!) 151/44 (!) 141/50  (!) 142/66  Pulse: 90 80  85  Resp: 18 18  17   Temp: 97.8 F (36.6 C) 99.1 F (37.3 C)  98.7 F (37.1 C)  TempSrc: Oral Oral    SpO2: 96% 94%  98%  Weight:   70.7 kg   Height:       General appearance: Awake alert.  In no distress Resp: Normal effort at rest.  Coarse breath sounds with occasional scattered wheezing. Cardio: S1-S2 is normal regular.  No S3-S4.  Systolic murmur over the precordium. GI: Abdomen is soft.  Nontender nondistended.  Bowel sounds are present normal.  No masses organomegaly Extremities: No edema.  Full range of motion of lower extremities. Neurologic: Alert and oriented x3.  Right-sided weakness from previous stroke   DISPOSITION: SNF  Discharge Instructions     Call  MD for:  difficulty breathing, headache or visual disturbances   Complete by: As directed    Call MD for:  extreme fatigue   Complete by: As directed    Call MD for:  persistant dizziness or light-headedness   Complete by: As directed    Call MD for:  persistant nausea and vomiting   Complete by: As directed    Call MD for:  severe uncontrolled pain   Complete by: As directed    Call MD for:  temperature >100.4   Complete by: As directed    Discharge instructions   Complete by: As directed    Please review instructions on the discharge summary.  You were cared for by a hospitalist during your hospital stay. If you have any questions about your discharge medications or the care you received while you were in the hospital after you are discharged, you can call the unit and asked to speak with the hospitalist on call if the hospitalist that took care of you is not available. Once you are discharged, your primary care physician will handle any further medical issues. Please note that NO REFILLS for any discharge medications will be authorized once you are discharged, as it is imperative that you return to your primary care physician (or establish a relationship with a primary care physician if you do not have one) for your aftercare needs so that they can reassess your need for medications and monitor your lab values. If you do not have a primary care physician, you can call [phone] for a physician referral.   Increase activity slowly   Complete by: As directed  Allergies as of 01/19/2021   No Known Allergies      Medication List     TAKE these medications    albuterol 108 (90 Base) MCG/ACT inhaler Commonly known as: VENTOLIN HFA Inhale 2 puffs into the lungs every 6 (six) hours as needed for wheezing or shortness of breath.   aspirin 325 MG tablet Take 325 mg by mouth daily.   Calcium 200 MG Tabs Take 1 tablet by mouth in the morning and at bedtime.   clopidogrel  75 MG tablet Commonly known as: PLAVIX Take 1 tablet (75 mg total) by mouth daily.   cyanocobalamin 1000 MCG tablet Take 1 tablet (1,000 mcg total) by mouth daily.   feeding supplement (GLUCERNA SHAKE) Liqd Take 237 mLs by mouth 3 (three) times daily between meals.   ferrous sulfate 325 (65 FE) MG EC tablet Take 325 mg by mouth daily with breakfast.   furosemide 40 MG tablet Commonly known as: LASIX Take 0.5 tablets (20 mg total) by mouth daily.   losartan 100 MG tablet Commonly known as: COZAAR Take 1 tablet (100 mg total) by mouth daily.   lovastatin 40 MG tablet Commonly known as: MEVACOR Take 1 tablet (40 mg total) by mouth at bedtime.   melatonin 1 MG Tabs tablet Take 2 mg by mouth at bedtime.   metFORMIN 1000 MG tablet Commonly known as: GLUCOPHAGE TAKE 1 TABLET BY MOUTH TWICE DAILY BEFORE MEAL(S)   metoprolol tartrate 50 MG tablet Commonly known as: LOPRESSOR Take 1 tablet (50 mg total) by mouth 2 (two) times daily.   ONE-A-DAY WOMENS 50+ ADVANTAGE PO Take 1 tablet by mouth daily.   OXYGEN Inhale into the lungs at bedtime.   polyethylene glycol 17 g packet Commonly known as: MIRALAX / GLYCOLAX Take 17 g by mouth daily.   potassium chloride 10 MEQ tablet Commonly known as: KLOR-CON Take 1 tablet (10 mEq total) by mouth daily.   senna-docusate 8.6-50 MG tablet Commonly known as: Senokot-S Take 1 tablet by mouth at bedtime as needed for mild constipation.          Contact information for follow-up providers     Juline Patch, MD. Schedule an appointment as soon as possible for a visit in 1 week(s).   Specialty: Family Medicine Contact information: [address] [place] [address] [phone]              Contact information for after-discharge care     Destination     [facility] .   Service: Skilled Nursing Contact information: [address] [place]  [place] [phone]                     TOTAL DISCHARGE TIME: 59 minutes  [facility] Pager on [email or web address]  01/19/2021, 9:21 AM

## 2021-01-19 NOTE — Care Management Important Message (Signed)
Important Message  Patient Details  Name: Beth Franco MRN: [id number] Date of Birth: December 09, 1934   Medicare Important Message Given:  Yes     Dannette Barbara 01/19/2021, 4:48 PM

## 2021-01-19 NOTE — Progress Notes (Signed)
Patient d/c to [facility]. Called multiple times to give report and unable to speak with a nurse. Left on hold twice and the 3rd time the phone was hung up. Pt being transported via EMS

## 2021-01-23 ENCOUNTER — Telehealth: Payer: Self-pay

## 2021-01-23 NOTE — Progress Notes (Signed)
  Left message for patient to call back and schedule Medicare Annual Wellness Visit (AWV) on  Saturday Sept 24,2022 to do by phone or video 8-12  Patients daughter declines at this time states that patient Is in [place], [place], [place], [place] [address] Direct Dial: [phone] [email or web address]: [email or web address]

## 2021-02-25 ENCOUNTER — Non-Acute Institutional Stay: Payer: Medicare PPO | Admitting: Primary Care

## 2021-02-25 ENCOUNTER — Other Ambulatory Visit: Payer: Self-pay

## 2021-02-25 DIAGNOSIS — R531 Weakness: Secondary | ICD-10-CM

## 2021-02-25 DIAGNOSIS — E1169 Type 2 diabetes mellitus with other specified complication: Secondary | ICD-10-CM

## 2021-02-25 DIAGNOSIS — Z515 Encounter for palliative care: Secondary | ICD-10-CM

## 2021-02-25 DIAGNOSIS — E785 Hyperlipidemia, unspecified: Secondary | ICD-10-CM

## 2021-02-25 NOTE — Progress Notes (Signed)
[facility] Palliative Care Consult Note Telephone: [phone]  Fax: [phone]   Date of encounter: 02/25/21 5:15 PM PATIENT NAME: Beth Franco [address] [place] [address]   [phone] (home)  DOB: 21-Sep-1934 MRN: [id number] PRIMARY CARE PROVIDER:    Juline Patch, MD,  [address] [place] [address] [phone]  REFERRING PROVIDER:   Brayton Franco, [place],  [place] [address] [phone]  RESPONSIBLE PARTY:    Contact Information     Name Relation Home Work Mobile   [place] Daughter   [phone]        I met face to face with patient in [facility] facility. Palliative Care was asked to follow this patient by consultation request of  Beth Mars, MD to address advance care planning and complex medical decision making. This is the initial visit.                                     ASSESSMENT AND PLAN / RECOMMENDATIONS:   Advance Care Planning/Goals of Care: Goals include to maximize quality of life and symptom management.  CODE STATUS: DNR Reached out to daughter and POA Beth Franco, no answer, message left.   Symptom Management/Plan:  I  met with patient in her nursing home room. She's lying flat in the bed and declined a pillow.   Decline/SOB: She denies dyspnea and states she stopped smoking as a result of coming to SNF. She has oxygen in place. She's alert and oriented and able to speak about her recent hospitalization and nursing home. She states prior to this she was living with her daughter and her fianc but likely will be at the skilled facility for sometime.  Reviewed recent hospital notes, imaging and labs.  Nutrition:She endorses poor appetite and intake due to dysgeusia. I encouraged her family to bring additional food if she thought she might enjoy that, and would recommend med pass if she's not getting it. She is getting magic cup and mighty shakes or  similar. Albumin was 3.0 a month ago.  SNF records current weight at 115 lbs and EPIC record states 156 lbs. This would be a 26% loss in several months if SNF weight accurate. Please weigh patient again and verify current weight and place in record for review. I recommend weekly weights x 2 months for monitoring.  She denies pain or dyspnea . She states she does not get out of bed now that her rehab is finished. She has a do not resuscitate on file.  Follow up Palliative Care Visit: Palliative care will continue to follow for complex medical decision making, advance care planning, and clarification of goals. Return 4-6 weeks or prn.  I spent 25 minutes providing this consultation. More than 50% of the time in this consultation was spent in counseling and care coordination.  PPS: 30%  HOSPICE ELIGIBILITY/DIAGNOSIS: pending verification of weight loss  Chief Complaint: debility  HISTORY OF PRESENT ILLNESS:  Beth Franco is a 86 y.o. year old female  with recent pneumonia, oxygen dependence, debility, immobility, dementia. Seen today to establish care and review palliative goals.   History obtained from review of EMR, discussion with primary team, and interview with family, facility staff/caregiver and/or Beth Franco.  I reviewed available labs, medications, imaging, studies and related documents from the EMR.  Records reviewed and summarized above.   ROS  General: NAD ENMT: denies dysphagia Cardiovascular: denies chest pain, denies DOE Pulmonary: denies cough, denies increased SOB, endorses smoking history and recent cessation Abdomen: endorses poor appetite, denies constipation, endorses incontinence of bowel GU: denies dysuria, endorses incontinence of urine MSK:  endorses  weakness,  no falls reported Skin: denies rashes or wounds Neurological: denies pain, denies insomnia Psych: Endorses positive mood Heme/lymph/immuno: denies bruises, abnormal bleeding  Physical Exam: Current  and past weights: 115 lb per snf record reported, 156 lbs 2-3 months ago Constitutional: NAD General: frail appearing, thin EYES: anicteric sclera, lids intact, no discharge  ENMT: intact hearing, oral mucous membranes moist, dentition intact CV: S1S2, RRR, no LE edema Pulmonary: LCTA, no increased work of breathing, no cough, supplemental oxygen Abdomen: intake 25-50%, normo-active BS + 4 quadrants, soft and non tender, no ascites GU: deferred MSK: +sarcopenia, moves all extremities, non ambulatory Skin: warm and dry, no rashes or wounds on visible skin Neuro:  + generalized weakness,  + cognitive impairment Psych: non-anxious affect, A and O x 1-2 Hem/lymph/immuno: no widespread bruising CURRENT PROBLEM LIST:  Patient Active Problem List   Diagnosis Date Noted   Weakness    Pneumonia 01/12/2021   Acute lower UTI 01/12/2021   HTN (hypertension) 01/12/2021   Type 2 diabetes mellitus with hyperlipidemia (HCC) 01/12/2021   PAST MEDICAL HISTORY:  Active Ambulatory Problems    Diagnosis Date Noted   Pneumonia 01/12/2021   Acute lower UTI 01/12/2021   HTN (hypertension) 01/12/2021   Type 2 diabetes mellitus with hyperlipidemia (HCC) 01/12/2021   Weakness    Resolved Ambulatory Problems    Diagnosis Date Noted   No Resolved Ambulatory Problems   Past Medical History:  Diagnosis Date   Cancer (HCC)    COPD (chronic obstructive pulmonary disease) (HCC)    Coronary artery disease    Diabetes mellitus without complication (HCC)    Hypercholesterolemia    Hypertension    SOCIAL HX:  Social History   Tobacco Use   Smoking status: Every Day    Types: Cigarettes   Smokeless tobacco: Never  Substance Use Topics   Alcohol use: Not Currently   FAMILY HX:  Family History  Problem Relation Age of Onset   Cancer Father       ALLERGIES: No Known Allergies   PERTINENT MEDICATIONS:  Outpatient Encounter Medications as of 02/25/2021  Medication Sig   albuterol (VENTOLIN HFA)  108 (90 Base) MCG/ACT inhaler Inhale 2 puffs into the lungs every 6 (six) hours as needed for wheezing or shortness of breath.   aspirin 325 MG tablet Take 325 mg by mouth daily.   Calcium 200 MG TABS Take 1 tablet by mouth in the morning and at bedtime.   clopidogrel (PLAVIX) 75 MG tablet Take 1 tablet (75 mg total) by mouth daily.   feeding supplement, GLUCERNA SHAKE, (GLUCERNA SHAKE) LIQD Take 237 mLs by mouth 3 (three) times daily between meals.   ferrous sulfate 325 (65 FE) MG EC tablet Take 325 mg by mouth daily with breakfast.   furosemide (LASIX) 40 MG tablet Take 0.5 tablets (20 mg total) by mouth daily.   losartan (COZAAR) 100 MG tablet Take 1 tablet (100 mg total) by mouth daily.   lovastatin (MEVACOR) 40 MG tablet Take 1 tablet (40 mg total) by mouth at bedtime.   melatonin 1 MG TABS tablet Take 2 mg by mouth at bedtime.   metFORMIN (GLUCOPHAGE) 1000 MG tablet TAKE 1 TABLET BY MOUTH TWICE DAILY BEFORE MEAL(S)  metoprolol tartrate (LOPRESSOR) 50 MG tablet Take 1 tablet (50 mg total) by mouth 2 (two) times daily.   Multiple Vitamins-Minerals (ONE-A-DAY WOMENS 50+ ADVANTAGE PO) Take 1 tablet by mouth daily.   OXYGEN Inhale into the lungs at bedtime.   polyethylene glycol (MIRALAX / GLYCOLAX) 17 g packet Take 17 g by mouth daily.   potassium chloride SA (KLOR-CON) 10 MEQ tablet Take 1 tablet (10 mEq total) by mouth daily.   senna-docusate (SENOKOT-S) 8.6-50 MG tablet Take 1 tablet by mouth at bedtime as needed for mild constipation.   vitamin B-12 1000 MCG tablet Take 1 tablet (1,000 mcg total) by mouth daily.   No facility-administered encounter medications on file as of 02/25/2021.     Thank you for the opportunity to participate in the care of Ms. Beam.  The palliative care team will continue to follow. Please call our office at [phone] if we can be of additional assistance.   Jason Coop, NP , DNP, AGPCNP-BC  COVID-19 PATIENT SCREENING TOOL Asked and negative  response unless otherwise noted:  Have you had symptoms of covid, tested positive or been in contact with someone with symptoms/positive test in the past 5-10 days?

## 2021-04-15 ENCOUNTER — Other Ambulatory Visit: Payer: Self-pay

## 2021-04-15 ENCOUNTER — Non-Acute Institutional Stay: Payer: Medicare PPO | Admitting: Primary Care

## 2021-04-17 ENCOUNTER — Non-Acute Institutional Stay: Payer: Medicare PPO | Admitting: Primary Care

## 2021-04-17 ENCOUNTER — Other Ambulatory Visit: Payer: Self-pay

## 2021-04-17 VITALS — Ht 65.0 in | Wt 115.0 lb

## 2021-04-17 DIAGNOSIS — E1169 Type 2 diabetes mellitus with other specified complication: Secondary | ICD-10-CM

## 2021-04-17 DIAGNOSIS — E785 Hyperlipidemia, unspecified: Secondary | ICD-10-CM

## 2021-04-17 DIAGNOSIS — R531 Weakness: Secondary | ICD-10-CM

## 2021-04-17 DIAGNOSIS — Z515 Encounter for palliative care: Secondary | ICD-10-CM

## 2021-04-17 NOTE — Progress Notes (Signed)
[facility]rapist, [facility] Palliative Care Consult Note Telephone: [phone]  Fax: [phone]    Date of encounter: 04/17/21 2:59 PM PATIENT NAME: Natha Guin [address] [place] [address]   [phone] (home)  DOB: Nov 09, 1934 MRN: [id number] PRIMARY CARE PROVIDER:    Duanne Limerick, MD,  [address] [place] [address] [phone]  REFERRING PROVIDER:   Dale Bridgetown, MD [address] [place],  [place] [address] [phone]   RESPONSIBLE PARTY:    Contact Information     Name Relation Home Work Mobile   [place] Daughter   [phone]        I met face to face with patient in [facility] acility. Palliative Care was asked to follow this patient by consultation request of  Dr Doy Mince to address advance care planning and complex medical decision making. This is a follow up visit.                                   ASSESSMENT AND PLAN / RECOMMENDATIONS:   Advance Care Planning/Goals of Care: Goals include to maximize quality of life and symptom management.  CODE STATUS: DNR  Symptom Management/Plan:  Patient states she is doing well, eating fairly well. Staff states she is at her baseline, no new issues. Weight is stable. Pt is able to smile and interact. She has oxygen in place, and able to speak with mild dyspnea. She endorses being in bed most of the time.  I reached out to POA and did not reach her, left message.   Follow up Palliative Care Visit: Palliative care will continue to follow for complex medical decision making, advance care planning, and clarification of goals. Return 8 weeks or prn.  I spent 15 minutes providing this consultation. More than 50% of the time in this consultation was spent in counseling and care coordination.  PPS: 30%  HOSPICE ELIGIBILITY/DIAGNOSIS: TBD  Chief Complaint: debility   HISTORY OF PRESENT ILLNESS:  Tatym Schermer is a 86 y.o. year old female  with debility,  immobility, COPD, oxygen dependence .   History obtained from review of EMR, discussion with primary team, and interview with family, facility staff/caregiver and/or Ms. Riviere.  I reviewed available labs, medications, imaging, studies and related documents from the EMR.  Records reviewed and summarized above.   ROS   General: NAD EYES: denies vision changes ENMT: denies dysphagia Cardiovascular: denies chest pain, endorses mild  DOE Pulmonary: denies cough, denies increased SOB Abdomen: endorses good appetite, denies constipation, endorses incontinence of bowel GU: denies dysuria, endorses incontinence of urine MSK:  endorses weakness,  no falls reported Skin: denies rashes or wounds Neurological: R leg  pain, denies insomnia Psych: Endorses positive mood Heme/lymph/immuno: denies bruises, abnormal bleeding  Physical Exam: Current and past weights: 115 lbs, to be verified Constitutional: NAD General: frail appearing, thin EYES: anicteric sclera, lids intact, no discharge  ENMT: intact hearing, oral mucous membranes moist, dentition intact CV: RRR, no LE edema Pulmonary:  no increased work of breathing, no cough, oxygen 2-3 L Abdomen: intake 50%, no ascites GU: deferred MSK: + sarcopenia, moves all extremities,  endorses R leg painnon- ambulatory Skin: warm and dry, no rashes or wounds on visible skin Neuro:  + generalized weakness,  + cognitive impairment Psych: non-anxious affect, A and O x 1-2 Hem/lymph/immuno: no widespread bruising  Thank you for the opportunity to participate in the care  of Ms. Miyazaki.  The palliative care team will continue to follow. Please call our office at [phone] if we can be of additional assistance.   Jason Coop, NP DNP, AGPCNP-BC  COVID-19 PATIENT SCREENING TOOL Asked and negative response unless otherwise noted:   Have you had symptoms of covid, tested positive or been in contact with someone with symptoms/positive test in the  past 5-10 days?

## 2021-06-15 ENCOUNTER — Inpatient Hospital Stay
Admission: EM | Admit: 2021-06-15 | Discharge: 2021-06-17 | DRG: 871 | Disposition: A | Discharge status: Hospice | Payer: Medicare PPO | Attending: Student in an Organized Health Care Education/Training Program | Admitting: Student in an Organized Health Care Education/Training Program

## 2021-06-15 ENCOUNTER — Other Ambulatory Visit: Payer: Self-pay

## 2021-06-15 ENCOUNTER — Emergency Department: Payer: Medicare PPO

## 2021-06-15 ENCOUNTER — Inpatient Hospital Stay: Payer: Medicare PPO

## 2021-06-15 ENCOUNTER — Encounter: Payer: Self-pay | Admitting: Emergency Medicine

## 2021-06-15 DIAGNOSIS — J449 Chronic obstructive pulmonary disease, unspecified: Secondary | ICD-10-CM | POA: Diagnosis present

## 2021-06-15 DIAGNOSIS — I2510 Atherosclerotic heart disease of native coronary artery without angina pectoris: Secondary | ICD-10-CM | POA: Diagnosis present

## 2021-06-15 DIAGNOSIS — L8994 Pressure ulcer of unspecified site, stage 4: Secondary | ICD-10-CM

## 2021-06-15 DIAGNOSIS — N179 Acute kidney failure, unspecified: Secondary | ICD-10-CM | POA: Diagnosis present

## 2021-06-15 DIAGNOSIS — F419 Anxiety disorder, unspecified: Secondary | ICD-10-CM | POA: Diagnosis present

## 2021-06-15 DIAGNOSIS — N390 Urinary tract infection, site not specified: Secondary | ICD-10-CM | POA: Diagnosis present

## 2021-06-15 DIAGNOSIS — Z9981 Dependence on supplemental oxygen: Secondary | ICD-10-CM | POA: Diagnosis not present

## 2021-06-15 DIAGNOSIS — E7800 Pure hypercholesterolemia, unspecified: Secondary | ICD-10-CM | POA: Diagnosis present

## 2021-06-15 DIAGNOSIS — R0902 Hypoxemia: Secondary | ICD-10-CM

## 2021-06-15 DIAGNOSIS — J9611 Chronic respiratory failure with hypoxia: Secondary | ICD-10-CM | POA: Diagnosis present

## 2021-06-15 DIAGNOSIS — L89154 Pressure ulcer of sacral region, stage 4: Secondary | ICD-10-CM | POA: Diagnosis present

## 2021-06-15 DIAGNOSIS — Z515 Encounter for palliative care: Secondary | ICD-10-CM

## 2021-06-15 DIAGNOSIS — E1169 Type 2 diabetes mellitus with other specified complication: Secondary | ICD-10-CM | POA: Diagnosis present

## 2021-06-15 DIAGNOSIS — Z7902 Long term (current) use of antithrombotics/antiplatelets: Secondary | ICD-10-CM

## 2021-06-15 DIAGNOSIS — I10 Essential (primary) hypertension: Secondary | ICD-10-CM | POA: Diagnosis present

## 2021-06-15 DIAGNOSIS — R627 Adult failure to thrive: Secondary | ICD-10-CM | POA: Diagnosis present

## 2021-06-15 DIAGNOSIS — L89144 Pressure ulcer of left lower back, stage 4: Secondary | ICD-10-CM | POA: Diagnosis not present

## 2021-06-15 DIAGNOSIS — M869 Osteomyelitis, unspecified: Secondary | ICD-10-CM | POA: Diagnosis present

## 2021-06-15 DIAGNOSIS — J189 Pneumonia, unspecified organism: Secondary | ICD-10-CM | POA: Diagnosis present

## 2021-06-15 DIAGNOSIS — G9341 Metabolic encephalopathy: Secondary | ICD-10-CM

## 2021-06-15 DIAGNOSIS — Z7984 Long term (current) use of oral hypoglycemic drugs: Secondary | ICD-10-CM

## 2021-06-15 DIAGNOSIS — Z66 Do not resuscitate: Secondary | ICD-10-CM | POA: Diagnosis present

## 2021-06-15 DIAGNOSIS — R6520 Severe sepsis without septic shock: Secondary | ICD-10-CM | POA: Diagnosis present

## 2021-06-15 DIAGNOSIS — E875 Hyperkalemia: Secondary | ICD-10-CM | POA: Diagnosis present

## 2021-06-15 DIAGNOSIS — R54 Age-related physical debility: Secondary | ICD-10-CM | POA: Diagnosis present

## 2021-06-15 DIAGNOSIS — K603 Anal fistula: Secondary | ICD-10-CM | POA: Diagnosis present

## 2021-06-15 DIAGNOSIS — L089 Local infection of the skin and subcutaneous tissue, unspecified: Secondary | ICD-10-CM | POA: Diagnosis not present

## 2021-06-15 DIAGNOSIS — A419 Sepsis, unspecified organism: Secondary | ICD-10-CM | POA: Diagnosis present

## 2021-06-15 DIAGNOSIS — Z20822 Contact with and (suspected) exposure to covid-19: Secondary | ICD-10-CM | POA: Diagnosis present

## 2021-06-15 DIAGNOSIS — F17210 Nicotine dependence, cigarettes, uncomplicated: Secondary | ICD-10-CM | POA: Diagnosis present

## 2021-06-15 DIAGNOSIS — E785 Hyperlipidemia, unspecified: Secondary | ICD-10-CM | POA: Diagnosis not present

## 2021-06-15 DIAGNOSIS — Z7982 Long term (current) use of aspirin: Secondary | ICD-10-CM

## 2021-06-15 DIAGNOSIS — L8990 Pressure ulcer of unspecified site, unspecified stage: Secondary | ICD-10-CM

## 2021-06-15 DIAGNOSIS — Z720 Tobacco use: Secondary | ICD-10-CM

## 2021-06-15 DIAGNOSIS — Z7189 Other specified counseling: Secondary | ICD-10-CM | POA: Diagnosis not present

## 2021-06-15 DIAGNOSIS — R778 Other specified abnormalities of plasma proteins: Secondary | ICD-10-CM | POA: Diagnosis present

## 2021-06-15 DIAGNOSIS — Z79899 Other long term (current) drug therapy: Secondary | ICD-10-CM

## 2021-06-15 LAB — BASIC METABOLIC PANEL
Anion gap: 6 (ref 5–15)
BUN: 67 mg/dL — ABNORMAL HIGH (ref 8–23)
CO2: 24 mmol/L (ref 22–32)
Calcium: 8.9 mg/dL (ref 8.9–10.3)
Chloride: 111 mmol/L (ref 98–111)
Creatinine, Ser: 1.58 mg/dL — ABNORMAL HIGH (ref 0.44–1.00)
GFR, Estimated: 32 mL/min — ABNORMAL LOW (ref >60)
Glucose, Bld: 122 mg/dL — ABNORMAL HIGH (ref 70–99)
Potassium: 5.6 mmol/L — ABNORMAL HIGH (ref 3.5–5.1)
Sodium: 141 mmol/L (ref 135–145)

## 2021-06-15 LAB — CBC WITH DIFFERENTIAL/PLATELET
Abs Immature Granulocytes: 0.12 10*3/uL — ABNORMAL HIGH (ref 0.00–0.07)
Basophils Absolute: 0.1 10*3/uL (ref 0.0–0.1)
Basophils Relative: 0 %
Eosinophils Absolute: 0 10*3/uL (ref 0.0–0.5)
Eosinophils Relative: 0 %
HCT: 25 % — ABNORMAL LOW (ref 36.0–46.0)
Hemoglobin: 7.7 g/dL — ABNORMAL LOW (ref 12.0–15.0)
Immature Granulocytes: 1 %
Lymphocytes Relative: 7 %
Lymphs Abs: 1.3 10*3/uL (ref 0.7–4.0)
MCH: 31.4 pg (ref 26.0–34.0)
MCHC: 30.8 g/dL (ref 30.0–36.0)
MCV: 102 fL — ABNORMAL HIGH (ref 80.0–100.0)
Monocytes Absolute: 0.8 10*3/uL (ref 0.1–1.0)
Monocytes Relative: 4 %
Neutro Abs: 16.1 10*3/uL — ABNORMAL HIGH (ref 1.7–7.7)
Neutrophils Relative %: 88 %
Platelets: 500 10*3/uL — ABNORMAL HIGH (ref 150–400)
RBC: 2.45 MIL/uL — ABNORMAL LOW (ref 3.87–5.11)
RDW: 13.3 % (ref 11.5–15.5)
Smear Review: INCREASED
WBC: 18.4 10*3/uL — ABNORMAL HIGH (ref 4.0–10.5)
nRBC: 0 % (ref 0.0–0.2)

## 2021-06-15 LAB — COMPREHENSIVE METABOLIC PANEL
ALT: 33 U/L (ref 0–44)
AST: 44 U/L — ABNORMAL HIGH (ref 15–41)
Albumin: 2.1 g/dL — ABNORMAL LOW (ref 3.5–5.0)
Alkaline Phosphatase: 118 U/L (ref 38–126)
Anion gap: 5 (ref 5–15)
BUN: 74 mg/dL — ABNORMAL HIGH (ref 8–23)
CO2: 29 mmol/L (ref 22–32)
Calcium: 9.9 mg/dL (ref 8.9–10.3)
Chloride: 105 mmol/L (ref 98–111)
Creatinine, Ser: 1.8 mg/dL — ABNORMAL HIGH (ref 0.44–1.00)
GFR, Estimated: 27 mL/min — ABNORMAL LOW (ref >60)
Glucose, Bld: 153 mg/dL — ABNORMAL HIGH (ref 70–99)
Potassium: 6.4 mmol/L (ref 3.5–5.1)
Sodium: 139 mmol/L (ref 135–145)
Total Bilirubin: 0.6 mg/dL (ref 0.3–1.2)
Total Protein: 6.3 g/dL — ABNORMAL LOW (ref 6.5–8.1)

## 2021-06-15 LAB — LACTIC ACID, PLASMA
Lactic Acid, Venous: 2.8 mmol/L (ref 0.5–1.9)
Lactic Acid, Venous: 2.4 mmol/L (ref 0.5–1.9)

## 2021-06-15 LAB — RESP PANEL BY RT-PCR (FLU A&B, COVID) ARPGX2
Influenza A by PCR: NEGATIVE
Influenza B by PCR: NEGATIVE
SARS Coronavirus 2 by RT PCR: NEGATIVE

## 2021-06-15 LAB — MRSA NEXT GEN BY PCR, NASAL: MRSA by PCR Next Gen: NOT DETECTED

## 2021-06-15 LAB — URINALYSIS, COMPLETE (UACMP) WITH MICROSCOPIC
Bilirubin Urine: NEGATIVE
Glucose, UA: NEGATIVE mg/dL
Ketones, ur: NEGATIVE mg/dL
Nitrite: NEGATIVE
Protein, ur: NEGATIVE mg/dL
Specific Gravity, Urine: 1.014 (ref 1.005–1.030)
pH: 5 (ref 5.0–8.0)

## 2021-06-15 LAB — APTT: aPTT: 30 seconds (ref 24–36)

## 2021-06-15 LAB — BLOOD GAS, VENOUS
Acid-Base Excess: 4.5 mmol/L — ABNORMAL HIGH (ref 0.0–2.0)
Bicarbonate: 30.2 mmol/L — ABNORMAL HIGH (ref 20.0–28.0)
O2 Saturation: 41.2 %
Patient temperature: 37
pCO2, Ven: 51 mmHg (ref 44.0–60.0)
pH, Ven: 7.38 (ref 7.250–7.430)
pO2, Ven: <31 mmHg — CL (ref 32.0–45.0)

## 2021-06-15 LAB — SEDIMENTATION RATE: Sed Rate: 128 mm/hr — ABNORMAL HIGH (ref 0–30)

## 2021-06-15 LAB — PROTIME-INR
INR: 1.1 (ref 0.8–1.2)
Prothrombin Time: 14.6 seconds (ref 11.4–15.2)

## 2021-06-15 LAB — TROPONIN I (HIGH SENSITIVITY)
Troponin I (High Sensitivity): 61 ng/L — ABNORMAL HIGH (ref <18)
Troponin I (High Sensitivity): 53 ng/L — ABNORMAL HIGH (ref <18)

## 2021-06-15 LAB — BRAIN NATRIURETIC PEPTIDE: B Natriuretic Peptide: 540.9 pg/mL — ABNORMAL HIGH (ref 0.0–100.0)

## 2021-06-15 LAB — GLUCOSE, CAPILLARY: Glucose-Capillary: 128 mg/dL — ABNORMAL HIGH (ref 70–99)

## 2021-06-15 MED ORDER — SODIUM CHLORIDE 0.9 % IV BOLUS: 1000.0000 mL | Freq: Once | INTRAVENOUS | Status: DC

## 2021-06-15 MED ORDER — METRONIDAZOLE 500 MG/100ML IV SOLN
500.0000 mg | Freq: Once | INTRAVENOUS | Status: AC
  Administered 2021-06-15: 500 mg via INTRAVENOUS
  Filled 2021-06-15: qty 100

## 2021-06-15 MED ORDER — ASPIRIN 81 MG PO CHEW: 81.0000 mg | CHEWABLE_TABLET | Freq: Every day | ORAL | Status: DC

## 2021-06-15 MED ORDER — VANCOMYCIN HCL 1250 MG/250ML IV SOLN
1250.0000 mg | Freq: Once | INTRAVENOUS | Status: AC
  Administered 2021-06-15: 1250 mg via INTRAVENOUS
  Filled 2021-06-15: qty 250

## 2021-06-15 MED ORDER — LACTATED RINGERS IV BOLUS (SEPSIS): 1000.0000 mL | Freq: Once | INTRAVENOUS | Status: DC

## 2021-06-15 MED ORDER — SENNA 8.6 MG PO TABS
1.0000 | ORAL_TABLET | Freq: Two times a day (BID) | ORAL | Status: DC
  Filled 2021-06-15: qty 1

## 2021-06-15 MED ORDER — LACTATED RINGERS IV SOLN: INTRAVENOUS | Status: DC

## 2021-06-15 MED ORDER — SODIUM CHLORIDE 0.9 % IV BOLUS
1000.0000 mL | Freq: Once | INTRAVENOUS | Status: AC
  Administered 2021-06-15: 1000 mL via INTRAVENOUS

## 2021-06-15 MED ORDER — ONDANSETRON HCL 4 MG/2ML IJ SOLN: 4.0000 mg | Freq: Four times a day (QID) | INTRAMUSCULAR | Status: DC | PRN

## 2021-06-15 MED ORDER — METRONIDAZOLE 500 MG/100ML IV SOLN
500.0000 mg | Freq: Two times a day (BID) | INTRAVENOUS | Status: DC
  Administered 2021-06-16: 500 mg via INTRAVENOUS
  Filled 2021-06-15 (×2): qty 100

## 2021-06-15 MED ORDER — CHLORHEXIDINE GLUCONATE CLOTH 2 % EX PADS
6.0000 | MEDICATED_PAD | Freq: Every day | CUTANEOUS | Status: DC
  Administered 2021-06-15 – 2021-06-16 (×2): 6 via TOPICAL

## 2021-06-15 MED ORDER — DOCUSATE SODIUM 100 MG PO CAPS
100.0000 mg | ORAL_CAPSULE | Freq: Two times a day (BID) | ORAL | Status: DC
  Filled 2021-06-15: qty 1

## 2021-06-15 MED ORDER — ALBUTEROL SULFATE (2.5 MG/3ML) 0.083% IN NEBU: 2.5000 mg | INHALATION_SOLUTION | Freq: Four times a day (QID) | RESPIRATORY_TRACT | Status: DC | PRN

## 2021-06-15 MED ORDER — SODIUM ZIRCONIUM CYCLOSILICATE 5 G PO PACK
10.0000 g | PACK | Freq: Once | ORAL | Status: AC
  Administered 2021-06-15: 10 g via ORAL
  Filled 2021-06-15: qty 2
  Filled 2021-06-15: qty 1

## 2021-06-15 MED ORDER — FERROUS SULFATE 325 (65 FE) MG PO TABS: 325.0000 mg | ORAL_TABLET | Freq: Every day | ORAL | Status: DC

## 2021-06-15 MED ORDER — SODIUM CHLORIDE 0.9 % IV SOLN: Freq: Once | INTRAVENOUS | Status: DC

## 2021-06-15 MED ORDER — ACETAMINOPHEN 650 MG RE SUPP: 650.0000 mg | Freq: Four times a day (QID) | RECTAL | Status: DC | PRN

## 2021-06-15 MED ORDER — CLOPIDOGREL BISULFATE 75 MG PO TABS: 75.0000 mg | ORAL_TABLET | Freq: Every day | ORAL | Status: DC

## 2021-06-15 MED ORDER — SODIUM CHLORIDE 0.9 % IV SOLN
2.0000 g | Freq: Once | INTRAVENOUS | Status: AC
  Administered 2021-06-15: 2 g via INTRAVENOUS
  Filled 2021-06-15: qty 2

## 2021-06-15 MED ORDER — VANCOMYCIN VARIABLE DOSE PER UNSTABLE RENAL FUNCTION (PHARMACIST DOSING): Status: DC

## 2021-06-15 MED ORDER — ACETAMINOPHEN 325 MG PO TABS: 650.0000 mg | ORAL_TABLET | Freq: Four times a day (QID) | ORAL | Status: DC | PRN

## 2021-06-15 MED ORDER — SODIUM CHLORIDE 0.9 % IV SOLN
2.0000 g | INTRAVENOUS | Status: DC
  Filled 2021-06-15: qty 2

## 2021-06-15 MED ORDER — VANCOMYCIN HCL IN DEXTROSE 1-5 GM/200ML-% IV SOLN: 1000.0000 mg | Freq: Once | INTRAVENOUS | Status: DC

## 2021-06-15 MED ORDER — ONDANSETRON HCL 4 MG PO TABS: 4.0000 mg | ORAL_TABLET | Freq: Four times a day (QID) | ORAL | Status: DC | PRN

## 2021-06-15 MED ORDER — SODIUM CHLORIDE 0.9 % IV SOLN: INTRAVENOUS | Status: DC

## 2021-06-15 NOTE — ED Notes (Signed)
[place] MD General Surgeon, in room.  Pt able to wake up and answer 'yes or no' questions at this time.

## 2021-06-15 NOTE — ED Notes (Signed)
Patient transported to CT 

## 2021-06-15 NOTE — Progress Notes (Signed)
Pharmacy Antibiotic Note  Beth Franco is a 87 y.o. female admitted on 06/15/2021 with sepsis.  Pharmacy has been consulted for vancomycin and cefepime dosing for 7 days.  Plan: Cefepime 2 g IV q24h Pt received vancomycin 1250 mg IV x 1 loading dose. Will hold off on maintenance dose due to Scr 1.8 (baseline 0.8-0.9). Will assess daily to determine if/when dose appropriate or if random vanc level needed Monitor renal function and adjust dose as clinically indicated  Height: 5\' 5"  (165.1 cm) Weight: 61.2 kg (135 lb) IBW/kg (Calculated) : 57  Temp (24hrs), Avg:98.6 F (37 C), Min:98.6 F (37 C), Max:98.6 F (37 C)  Recent Labs  Lab 06/15/21 1550  WBC 18.4*  CREATININE 1.80*  LATICACIDVEN 2.8*    Estimated Creatinine Clearance: 20.6 mL/min (A) (by C-G formula based on SCr of 1.8 mg/dL (H)).    No Known Allergies  Antimicrobials this admission: 2/13 metronidazole >>  2/13 cefepime >> 2/13 vancomycin >>     Thank you for allowing pharmacy to be a part of this patients care.  Forde Dandy Jayson Waterhouse 06/15/2021 5:57 PM

## 2021-06-15 NOTE — ED Provider Notes (Signed)
[facility] Provider Note    Event Date/Time   First MD Initiated Contact with Patient 06/15/21 1533     (approximate)   History   Failure To Thrive Complaint is failure to thrive  HPI  Beth Franco is a 87 y.o. female sent from nursing home for "failure to thrive" apparently has been less active than usual and less alert than usual.  Here patient looks at me will respond to me when I ask her questions but really is not moving much.  She denies any coughing shortness of breath chest pain or abdominal pain.      Physical Exam   Triage Vital Signs: ED Triage Vitals  Enc Vitals Group     BP      Pulse      Resp      Temp      Temp src      SpO2      Weight      Height      Head Circumference      Peak Flow      Pain Score      Pain Loc      Pain Edu?      Excl. in [place]?     Most recent vital signs: Most recent vital signs blood pressure 102/39 temperature of 98 pulse of 102 and respiratory rate of 22 with a pulse ox of 99%   General: Awake, alert, no distress.  CV:  Good peripheral perfusion.  Heart regular rate and rhythm no audible murmurs Resp:  Normal effort.  Patient not taking deep breaths but lungs sound clear Abd:  No distention.  Abdomen soft nontender no organomegaly Extremities: No edema there is a small healing decub on the right lateral malleolus.  There is also a small decubitus higher up the calf with a necrotic base but this is not very large. Skin: There is a large approximately 6 cm sacral decub with a necrotic base and surrounding at least 1 foot of blanching redness which looks like it cellulitic.  It is warm.  Patient moans when I touch the area.   ED Results / Procedures / Treatments   Labs (all labs ordered are listed, but only abnormal results are displayed) Labs Reviewed  COMPREHENSIVE METABOLIC PANEL - Abnormal; Notable for the following components:      Result Value   Potassium 6.4 (*)    Glucose, Bld 153 (*)     BUN 74 (*)    Creatinine, Ser 1.80 (*)    Total Protein 6.3 (*)    Albumin 2.1 (*)    AST 44 (*)    GFR, Estimated 27 (*)    All other components within normal limits  BRAIN NATRIURETIC PEPTIDE - Abnormal; Notable for the following components:   B Natriuretic Peptide 540.9 (*)    All other components within normal limits  LACTIC ACID, PLASMA - Abnormal; Notable for the following components:   Lactic Acid, Venous 2.8 (*)    All other components within normal limits  LACTIC ACID, PLASMA - Abnormal; Notable for the following components:   Lactic Acid, Venous 2.4 (*)    All other components within normal limits  CBC WITH DIFFERENTIAL/PLATELET - Abnormal; Notable for the following components:   WBC 18.4 (*)    RBC 2.45 (*)    Hemoglobin 7.7 (*)    HCT 25.0 (*)    MCV 102.0 (*)    Platelets 500 (*)  Neutro Abs 16.1 (*)    Abs Immature Granulocytes 0.12 (*)    All other components within normal limits  BLOOD GAS, VENOUS - Abnormal; Notable for the following components:   pO2, Ven <31.0 (*)    Bicarbonate 30.2 (*)    Acid-Base Excess 4.5 (*)    All other components within normal limits  SEDIMENTATION RATE - Abnormal; Notable for the following components:   Sed Rate 128 (*)    All other components within normal limits  URINALYSIS, COMPLETE (UACMP) WITH MICROSCOPIC - Abnormal; Notable for the following components:   Color, Urine YELLOW (*)    APPearance CLOUDY (*)    Hgb urine dipstick SMALL (*)    Leukocytes,Ua MODERATE (*)    Bacteria, UA RARE (*)    All other components within normal limits  BASIC METABOLIC PANEL - Abnormal; Notable for the following components:   Potassium 5.6 (*)    Glucose, Bld 122 (*)    BUN 67 (*)    Creatinine, Ser 1.58 (*)    GFR, Estimated 32 (*)    All other components within normal limits  GLUCOSE, CAPILLARY - Abnormal; Notable for the following components:   Glucose-Capillary 128 (*)    All other components within normal limits  TROPONIN I  (HIGH SENSITIVITY) - Abnormal; Notable for the following components:   Troponin I (High Sensitivity) 61 (*)    All other components within normal limits  TROPONIN I (HIGH SENSITIVITY) - Abnormal; Notable for the following components:   Troponin I (High Sensitivity) 53 (*)    All other components within normal limits  RESP PANEL BY RT-PCR (FLU A&B, COVID) ARPGX2  MRSA NEXT GEN BY PCR, NASAL  URINE CULTURE  CULTURE, BLOOD (ROUTINE X 2)  CULTURE, BLOOD (ROUTINE X 2)  APTT  PROTIME-INR  PROTIME-INR  CORTISOL-AM, BLOOD  PROCALCITONIN  COMPREHENSIVE METABOLIC PANEL  CBC  MAGNESIUM  PHOSPHORUS     EKG  EKG read interpreted by me shows normal sinus rhythm rate of 92 normal axis right bundle branch block similar to prior EKG from September last year except for new flipped T's in V5 and V6.  No sign of hyperkalemia on EKG   RADIOLOGY Initial chest x-ray shows no acute changes.  Slightly improved from the day before.  I reviewed the film Second chest x-ray shows increasing markings possible infection versus CHF.  I reviewed this film as well Head CT does not show any acute pathology per radiology I reviewed the film as well CT of the abdomen pelvis shows a pulmonary nodule and a very large decubitus with likely based on proximity osteomyelitis and gas in the soft tissue extending up to L5 and down toward the rectum.  I reviewed this film.  PROCEDURES:  Critical Care performed: Critical care time half an hour.  I spent some of this time talking to the patient and some of the time talking to the patient's daughter.  The patient's daughter agrees that the patient is to be a no code.  The daughter would like her treated but not to prolong her suffering.  I discussed this with the hospitalist and with the surgeon.  The surgeon was consulted because of the gas in the soft tissue.  I made both of my colleagues aware of the daughter's wishes as I stated in the above sentence.  I also reviewed the  old records and labs and reviewed the current studies.  We gave her IV fluids and cut back on the rate  considerably when she began to get short of breath.  Procedures   MEDICATIONS ORDERED IN ED: Medications  sodium chloride 0.9 % bolus 1,000 mL (1,000 mLs Intravenous Not Given 06/15/21 1828)  0.9 %  sodium chloride infusion ( Intravenous Not Given 06/15/21 1801)  aspirin chewable tablet 81 mg (has no administration in time range)  ferrous sulfate tablet 325 mg (has no administration in time range)  clopidogrel (PLAVIX) tablet 75 mg (has no administration in time range)  albuterol (PROVENTIL) (2.5 MG/3ML) 0.083% nebulizer solution 2.5 mg (has no administration in time range)  0.9 %  sodium chloride infusion ( Intravenous Infusion Verify 06/16/21 0000)  metroNIDAZOLE (FLAGYL) IVPB 500 mg (has no administration in time range)  acetaminophen (TYLENOL) tablet 650 mg (has no administration in time range)    Or  acetaminophen (TYLENOL) suppository 650 mg (has no administration in time range)  docusate sodium (COLACE) capsule 100 mg (100 mg Oral Patient Refused/Not Given 06/15/21 2227)  senna (SENOKOT) tablet 8.6 mg (8.6 mg Oral Patient Refused/Not Given 06/15/21 2228)  ondansetron (ZOFRAN) tablet 4 mg (has no administration in time range)    Or  ondansetron (ZOFRAN) injection 4 mg (has no administration in time range)  ceFEPIme (MAXIPIME) 2 g in sodium chloride 0.9 % 100 mL IVPB (has no administration in time range)  vancomycin variable dose per unstable renal function (pharmacist dosing) (has no administration in time range)  Chlorhexidine Gluconate Cloth 2 % PADS 6 each (6 each Topical Given 06/15/21 2128)  ceFEPIme (MAXIPIME) 2 g in sodium chloride 0.9 % 100 mL IVPB (0 g Intravenous Stopped 06/15/21 1751)  metroNIDAZOLE (FLAGYL) IVPB 500 mg (0 mg Intravenous Stopped 06/15/21 1818)  vancomycin (VANCOREADY) IVPB 1250 mg/250 mL (0 mg Intravenous Stopped 06/15/21 1932)  sodium chloride 0.9 % bolus 1,000  mL (0 mLs Intravenous Stopped 06/15/21 1818)  sodium zirconium cyclosilicate (LOKELMA) packet 10 g (10 g Oral Given 06/15/21 2230)     IMPRESSION / MDM / ASSESSMENT AND PLAN / ED COURSE  I reviewed the triage vital signs and the nursing notes. Patient with hyperkalemia.  We will switch sepsis fluids from LR to saline and watch her carefully.  Repeat the potassium after first doses of saline. Second BMP shows a decrease in the potassium.  There is also some decrease in the BUN and creatinine.  I discussed her in detail with the admitting physician.  She did go to the ICU.  She is very ill and with gas in the soft tissue may not live for very long. he patient is on the cardiac monitor to evaluate for evidence of arrhythmia and/or significant heart rate changes.  Patient was tachycardic but had no arrhythmias while she was here.  Patient did have a markedly elevated white count and was anemic.  Additionally her potassium was elevated and she had AKI.  Her BNP was up and her troponin was somewhat elevated as well.  Her most recent GFR before today was normal.   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   Nena Polio, MD 06/16/21  0023 ° °

## 2021-06-15 NOTE — Sepsis Progress Note (Signed)
Per bedside RN, Blood Cultures were drawn at 1543

## 2021-06-15 NOTE — Progress Notes (Signed)
PHARMACY -  BRIEF ANTIBIOTIC NOTE   Pharmacy has received consult(s) for vancomycin and cefepime from an ED provider.  The patient's profile has been reviewed for ht/wt/allergies/indication/available labs.    One time order(s) placed for: Cefepime 2 g IV Vancomycin 1250 mg IV  Further antibiotics/pharmacy consults should be ordered by admitting physician if indicated.                       Thank you, Forde Dandy Rakhi Romagnoli 06/15/2021  4:44 PM

## 2021-06-15 NOTE — ED Notes (Signed)
Pt RR increased to 30 breaths per min and O2 sat 86% on 4L [place]. Pt does have a hx of CHF, spoke to Dr. Cinda Quest, MD verbal orders to not give 2nd bolus of fluid and to repeat blood work at this time.   See new orders.

## 2021-06-15 NOTE — Consult Note (Signed)
[facility] SURGICAL CONSULTATION NOTE (initial) - cpt: 37902   HISTORY OF PRESENT ILLNESS (HPI):  87 y.o. female presented to [facility] ED today for evaluation of failure to thrive, diminished activity, diminished responsiveness, hypoxemia.   Surgery is consulted by ED physician Dr. Rip Harbour in this context for evaluation and management of sepsis likely secondary from extensive stage IV undrained sacral decubitus.  The patient is unhelpful for history, history obtained from daughter Inez Pilgrim, via telephone.  PAST MEDICAL HISTORY (PMH):  Past Medical History:  Diagnosis Date   Cancer ([place])    COPD (chronic obstructive pulmonary disease) ([place])    Coronary artery disease    Diabetes mellitus without complication ([place])    Hypercholesterolemia    Hypertension      PAST SURGICAL HISTORY ([place]):  Past Surgical History:  Procedure Laterality Date   ABDOMINAL HYSTERECTOMY     BREAST SURGERY     lumpectomy   CARDIAC SURGERY     EYE SURGERY     cataract     MEDICATIONS:  Prior to Admission medications   Medication Sig Start Date End Date Taking? Authorizing Provider  aspirin 81 MG chewable tablet Chew 81 mg by mouth daily.   Yes [provider]  clopidogrel (PLAVIX) 75 MG tablet Take 1 tablet (75 mg total) by mouth daily. 07/15/20  Yes Juline Patch, MD  losartan (COZAAR) 100 MG tablet Take 1 tablet (100 mg total) by mouth daily. 07/15/20  Yes Juline Patch, MD  lovastatin (MEVACOR) 40 MG tablet Take 1 tablet (40 mg total) by mouth at bedtime. 07/15/20  Yes Juline Patch, MD  metFORMIN (GLUCOPHAGE) 1000 MG tablet TAKE 1 TABLET BY MOUTH TWICE DAILY BEFORE MEAL(S) Patient taking differently: Take 1,000 mg by mouth 2 (two) times daily with a meal. TAKE 1 TABLET BY MOUTH TWICE DAILY BEFORE MEAL(S) 11/24/20  Yes Juline Patch, MD  SANTYL ointment Apply 1 application topically daily. 06/12/21  Yes [provider]  albuterol (VENTOLIN HFA) 108 (90 Base)  MCG/ACT inhaler Inhale 2 puffs into the lungs every 6 (six) hours as needed for wheezing or shortness of breath. 02/12/20   Juline Patch, MD  Calcium 200 MG TABS Take 1 tablet by mouth in the morning and at bedtime. Patient not taking: Reported on 06/15/2021    [provider]  Calcium Carb-Cholecalciferol (OYSTER SHELL CALCIUM W/D) 500-5 MG-MCG TABS Take 1 tablet by mouth 2 (two) times daily. 02/26/21   [provider]  feeding supplement, GLUCERNA SHAKE, (GLUCERNA SHAKE) LIQD Take 237 mLs by mouth 3 (three) times daily between meals. 01/19/21   Bonnielee Haff, MD  ferrous sulfate 324 (65 Fe) MG TBEC Take 324 mg by mouth daily with breakfast. 02/26/21   [provider]  furosemide (LASIX) 20 MG tablet Take 20 mg by mouth daily. 06/11/21   [provider]  melatonin 1 MG TABS tablet Take 2 mg by mouth at bedtime.    [provider]  metoprolol tartrate (LOPRESSOR) 50 MG tablet Take 1 tablet (50 mg total) by mouth 2 (two) times daily. 07/15/20   Juline Patch, MD  Multiple Vitamins-Minerals (ONE-A-DAY WOMENS 50+ ADVANTAGE PO) Take 1 tablet by mouth daily.    [provider]  OXYGEN Inhale into the lungs at bedtime.    [provider]  polyethylene glycol (MIRALAX / GLYCOLAX) 17 g packet Take 17 g by mouth daily. 01/19/21   Bonnielee Haff, MD  potassium chloride SA (KLOR-CON) 10 MEQ  tablet Take 1 tablet (10 mEq total) by mouth daily. 01/19/21   Bonnielee Haff, MD  senna-docusate (SENOKOT-S) 8.6-50 MG tablet Take 1 tablet by mouth at bedtime as needed for mild constipation. 01/19/21   Bonnielee Haff, MD  vitamin B-12 1000 MCG tablet Take 1 tablet (1,000 mcg total) by mouth daily. 01/19/21   Bonnielee Haff, MD     ALLERGIES:  No Known Allergies   SOCIAL HISTORY:  Social History   Socioeconomic History   Marital status: Widowed    Spouse name: Not on file   Number of children: Not on file   Years of education: Not on file    Highest education level: Not on file  Occupational History   Not on file  Tobacco Use   Smoking status: Every Day    Types: Cigarettes   Smokeless tobacco: Never  Vaping Use   Vaping Use: Never used  Substance and Sexual Activity   Alcohol use: Not Currently   Drug use: Never   Sexual activity: Not Currently  Other Topics Concern   Not on file  Social History Narrative   Not on file   Social Determinants of Health   Financial Resource Strain: Not on file  Food Insecurity: Not on file  Transportation Needs: Not on file  Physical Activity: Not on file  Stress: Not on file  Social Connections: Not on file  Intimate Partner Violence: Not on file     FAMILY HISTORY:  Family History  Problem Relation Age of Onset   Cancer Father       REVIEW OF SYSTEMS:  Review of Systems  Unable to perform ROS: Mental acuity   VITAL SIGNS:  Temp:  [98.6 F (37 C)-100.1 F (37.8 C)] 98.9 F (37.2 C) (02/13 2125) Pulse Rate:  [92-100] 96 (02/13 2125) Resp:  [17-30] 19 (02/13 2125) BP: (97-112)/(42-60) 112/52 (02/13 2125) SpO2:  [90 %-100 %] 96 % (02/13 2125) Weight:  [53.6 kg-61.2 kg] 53.6 kg (02/13 2125)     Height: 5\' 5"  (165.1 cm) Weight: 53.6 kg BMI (Calculated): 19.66   INTAKE/OUTPUT:  No intake/output data recorded.  PHYSICAL EXAM:  Physical Exam Blood pressure (!) 112/52, pulse 96, temperature 98.9 F (37.2 C), temperature source Oral, resp. rate 19, height 5\' 5"  (1.651 m), weight 53.6 kg, SpO2 96 %. Last Weight  Most recent update: 06/15/2021  9:34 PM    Weight  53.6 kg (118 lb 2.7 oz)             CONSTITUTIONAL: Well developed, alert and arousable but minimally verbally responsive and aware without distress.   EYES: Sclera non-icteric.   EARS, NOSE, MOUTH AND THROAT:  The oropharynx is clear. Oral mucosa is pink and moist.  Hearing is intact to voice.  NECK: Trachea is midline, and there is no jugular venous distension.  LYMPH NODES:  Lymph nodes in the neck are  not enlarged. RESPIRATORY:  Lungs are diminished, and breath sounds are equal bilaterally. Normal respiratory effort without pathologic use of accessory muscles. CARDIOVASCULAR: Heart is regular in rate and rhythm. GI: The abdomen is soft, nontender, and nondistended.   GU: There is an 8 cm diameter necrotic full-thickness eschar involving the sacral area.  There is fluctuant bogginess extending cephalad to the lower lumbar area.  There is minimal changes in the perianal area to appreciate on physical exam.  MUSCULOSKELETAL:  Symmetrical muscle tone appreciated in all four extremities.    SKIN: Skin turgor is normal. No pathologic skin lesions appreciated.  NEUROLOGIC:  Motor and sensation appear grossly normal.  Cranial nerves are grossly without defect. PSYCH:  Alert and oriented to person, place and time. Affect is appropriate for situation.  Data Reviewed I have personally reviewed what is currently available of the patient's imaging, recent labs and medical records.    Labs:  CBC Latest Ref Rng & Units 06/15/2021 01/15/2021 01/13/2021  WBC 4.0 - 10.5 K/uL 18.4(H) 8.8 9.9  Hemoglobin 12.0 - 15.0 g/dL 7.7(L) 10.2(L) 10.2(L)  Hematocrit 36.0 - 46.0 % 25.0(L) 32.2(L) 30.9(L)  Platelets 150 - 400 K/uL 500(H) 370 336   CMP Latest Ref Rng & Units 06/15/2021 06/15/2021 01/18/2021  Glucose 70 - 99 mg/dL 122(H) 153(H) 129(H)  BUN 8 - 23 mg/dL 67(H) 74(H) 31(H)  Creatinine 0.44 - 1.00 mg/dL 1.58(H) 1.80(H) 0.84  Sodium 135 - 145 mmol/L 141 139 137  Potassium 3.5 - 5.1 mmol/L 5.6(H) 6.4(HH) 4.8  Chloride 98 - 111 mmol/L 111 105 98  CO2 22 - 32 mmol/L 24 29 30   Calcium 8.9 - 10.3 mg/dL 8.9 9.9 8.4(L)  Total Protein 6.5 - 8.1 g/dL - 6.3(L) -  Total Bilirubin 0.3 - 1.2 mg/dL - 0.6 -  Alkaline Phos 38 - 126 U/L - 118 -  AST 15 - 41 U/L - 44(H) -  ALT 0 - 44 U/L - 33 -     Imaging studies:   Last 24 hrs: CT ABDOMEN PELVIS WO CONTRAST  Result Date: 06/15/2021 CLINICAL DATA:  Sepsis Large  necrotic looking sacral decub with surrounding blanching redness consistent with cellulitis and foul smell EXAM: CT ABDOMEN AND PELVIS WITHOUT CONTRAST TECHNIQUE: Multidetector CT imaging of the abdomen and pelvis was performed following the standard protocol without IV contrast. RADIATION DOSE REDUCTION: This exam was performed according to the departmental dose-optimization program which includes automated exposure control, adjustment of the mA and/or kV according to patient size and/or use of iterative reconstruction technique. COMPARISON:  None. FINDINGS: Lower chest: Bibasilar scarring and atelectasis. There is a 9 x 5 mm (average 7 mm) nodule in the left lung base (sagittal series 7, image 97). No acute abnormality. Hepatobiliary: No focal liver abnormality is seen. The gallbladder is unremarkable. Pancreas: Unremarkable. No pancreatic ductal dilatation or surrounding inflammatory changes. Spleen: Normal in size without focal abnormality. Adrenals/Urinary Tract: There is bilateral adrenal thickening with low-density 1.2 cm nodule in the left adrenal gland, statistically likely to be an adenoma. No hydronephrosis or nephrolithiasis. There is mild bilateral cortical renal atrophy. There is a exophytic right renal cyst, density consistent with a simple cyst. The bladder is unremarkable. Stomach/Bowel: Tiny hiatal hernia. The stomach is otherwise within normal limits. There is no evidence of bowel obstruction.The appendix is not definitively visualized, no right lower quadrant inflammation/in the region of the cecum. Vascular/Lymphatic: Aortoiliac atherosclerotic calcifications. No AAA. No lymphadenopathy. Reproductive: Prior hysterectomy. Other: No bowel containing hernia.  No free air.  No ascites. Musculoskeletal: There is a deep sacral decubitus ulcer extending to the sacrum and coccyx, with probable underlying osteomyelitis of S5 and the coccyx and potentially S4 based on the proximity of the ulcer. There is  no frank bony destruction at this time but there is potential intraosseous gas within two coccygeal bones (sagittal image 88). There is gas extending into the sacral canal (sagittal image 86). There is extensive subcutaneous gas extending up the back to the level of L5-S1. Gas extends along the left gluteus maximus muscle in there appears to be a gaseous connection coursing towards the ischial  rectal fossa in the anus (series 3, images 75-79). There is no drainable fluid collection on noncontrast CT. No evidence of acute fracture. Multilevel degenerative changes of the spine, most severe at L2-L3. Moderate bilateral hip osteoarthritis. IMPRESSION: Deep sacral decubitus ulcer extending to the sacrum and coccyx, with probable underlying osteomyelitis of S5 and the coccyx, and potentially of S4, based on proximity of the ulcer. No frank bony destruction at this time. Soft tissue gas extends into the sacral canal to the level of S2-S3, subcutaneously to the level of L5-S1, and laterally along the left gluteus maximus muscle. Extension of gas towards the left ischiorectal fossa and the anus, suggesting the possibility of a perianal fistula. There is no drainable fluid collection on noncontrast CT. Indeterminate 7 mm pulmonary nodule in the left lung base. Non-contrast chest CT at 6-12 months is recommended. If the nodule is stable at time of repeat CT, then future CT at 18-24 months (from today's scan) is considered optional for low-risk patients, but is recommended for high-risk patients. This recommendation follows the consensus statement: Guidelines for Management of Incidental Pulmonary Nodules Detected on CT Images: From the Fleischner Society 2017; Radiology 2017; 284:228-243. Electronically Signed   By: Maurine Simmering M.D.   On: 06/15/2021 17:22   CT Head Wo Contrast  Result Date: 06/15/2021 CLINICAL DATA:  Altered mental status, weakness, decreased appetite EXAM: CT HEAD WITHOUT CONTRAST TECHNIQUE: Contiguous  axial images were obtained from the base of the skull through the vertex without intravenous contrast. RADIATION DOSE REDUCTION: This exam was performed according to the departmental dose-optimization program which includes automated exposure control, adjustment of the mA and/or kV according to patient size and/or use of iterative reconstruction technique. COMPARISON:  01/12/2021 FINDINGS: Brain: Stable atrophy pattern and chronic white matter microvascular ischemic changes throughout both cerebral hemispheres. No acute intracranial hemorrhage, mass lesion, new infarction, midline shift, herniation, hydrocephalus, or extra-axial fluid collection. Cerebellar atrophy as well. Vascular: Intracranial atherosclerosis.  No hyperdense vessel. Skull: Normal. Negative for fracture or focal lesion. Sinuses/Orbits: No acute finding. Other: None. IMPRESSION: Stable atrophy and white matter microvascular ischemic changes. No interval change or acute process by noncontrast CT. Electronically Signed   By: Jerilynn Mages.  Shick M.D.   On: 06/15/2021 17:11   DG Chest Portable 1 View  Result Date: 06/15/2021 CLINICAL DATA:  Increasing shortness of breath, evaluate for CHF EXAM: PORTABLE CHEST 1 VIEW COMPARISON:  Radiograph 06/15/2021 FINDINGS: Unchanged cardiomediastinal silhouette. Central pulmonary vascular congestion and mild diffuse interstitial opacities. There are patchy lower lung airspace opacities. No large pleural effusion. No visible pneumothorax. There is no acute osseous abnormality. IMPRESSION: Findings suggestive of mild pulmonary edema and/or multifocal infection. Electronically Signed   By: Maurine Simmering M.D.   On: 06/15/2021 18:32   DG Chest Portable 1 View  Result Date: 06/15/2021 CLINICAL DATA:  Week.  Weakness and decreased appetite. EXAM: PORTABLE CHEST 1 VIEW COMPARISON:  01/12/2021 FINDINGS: Cardiac silhouette and mediastinal contours are unchanged and within normal limits. Mild bilateral interstitial thickening is  similar to prior. Improved aeration of the lung bases. The prior tiny pleural effusions non-small visualized on the current frontal radiograph. No pneumothorax is seen. No acute osseous abnormality. IMPRESSION: Mild bilateral interstitial thickening is similar to prior. Mildly improved aeration of the lung bases compared to yesterday. Electronically Signed   By: Yvonne Kendall M.D.   On: 06/15/2021 17:19     Assessment/Plan:  87 y.o. female with sepsis likely secondary to infected stage IV sacral decubitus, complicated  by pertinent comorbidities including:  Patient Active Problem List   Diagnosis Date Noted   Sepsis secondary to UTI ([place]) 06/15/2021   Hyperkalemia 06/15/2021   Decubitus ulcer 06/15/2021   COPD with hypoxia ([place]) 06/15/2021   Tobacco use 06/15/2021   Failure to thrive in adult 00/51/1021   Acute metabolic encephalopathy 11/73/5670   Weakness    Pneumonia 01/12/2021   Acute lower UTI 01/12/2021   HTN (hypertension) 01/12/2021   Type 2 diabetes mellitus with hyperlipidemia ([place]) 01/12/2021    -Thorough discussion with Ms. Mason confirmed the patient's desires to have comfort measures given, and not pursue aggressive intervention in the face of the futility involving such an extensive decubitus.  -Agree with palliative care consultation.  -As per daughter's desire to follow her mother's wishes, no surgical intervention will be entertained at this time.  - DVT prophylaxis  All of the above findings and recommendations were discussed with the patient and  family(if present), and all of patient's and present family's questions were answered to their expressed satisfaction.  Thank you for the opportunity to participate in this patient's care.   -- Ronny Bacon, M.D., FACS 06/15/2021, 10:09 PM

## 2021-06-15 NOTE — Progress Notes (Signed)
Pt arrived to rm ICU 9 at approx 2125, pt is alert and oriented to self and place. Pt ROMX4 generalized weakness, sacral wound noted wound care consult placed.

## 2021-06-15 NOTE — ED Triage Notes (Signed)
Pt via EMS from [facility]. Per daughter, pt has increased in weakness and decrease in appetite. Per family, pt is usually alert and talking and able to move around but now she is not speaking and not moving at all for the past 2-3 weeks. Pt does have some bed sore on her sacrum and R leg that are infected. Pt was afebrile with EMS. Pt is alert but responds seldom. Daughter at bedside. Pt is on 2L [place] chronically.

## 2021-06-15 NOTE — ED Notes (Signed)
CXR at bedside

## 2021-06-15 NOTE — H&P (Addendum)
History and Physical    Beth Franco TWS:[id number] DOB: 1934-05-12 DOA: 06/15/2021  PCP: Juline Patch, MD   Patient coming from: SNF  I have personally briefly reviewed patient's old medical records in [place]  Chief Complaint: Failure to thrive, confusion, decreased p.o. intake.  HPI: Beth Franco is a 87 y.o. female with PMH significant for COPD, chronic smoker 70+ years, chronic hypoxic respiratory failure on 2 L of supplemental oxygen at baseline, CAD, diabetes melitis, hypercholesterolemia, hypertension presented to the ED from his skilled nursing facility with altered mentation, failure to thrive, decreased p.o. intake.  History is obtained from daughter who reports she visited her mom last week, she was very confused and not following commands, SNF staff thought she might have a UTand the UA which came back fine, but she continued to feel confused and has decreased p.o. intake.  Daughter reports she has downward decline in her overall conditioning, she has chronic decubitus ulcers which has been treated at nursing home. Nursing home staff denied any fevers, chills, sick contacts, recent travel, coughing, nausea, vomiting, diarrhea.   ED Course: She was tachycardic, tachypneic, hypoxic and hypotensive. Temp 98.6, HR 100, RR 30, BP 97/60, SPO2 98% on 2 L. Labs include sodium 139, potassium 6.4, chloride 105, bicarb 29, glucose 153, BUN 74, creatinine 1.80, calcium 9.9, anion gap 5, alkaline phosphatase 118, albumin 2.1, AST 44, ALT 33, total protein 6.3, BNP 540.9, troponin 61, lactic acid 2.8, WBC 18.4, hemoglobin 7.7, hematocrit 25.0, MCV 109, platelet 500, UA: Leukocytes moderate, hemoglobin small, bacteria rare, Urine and blood cultures were sent. CT head no acute abnormality noted.  Chronic microvascular ischemic changes. CT A/P: Deep sacral decubitus ulcer extending to the sacrum and coccyx, with probable underlying osteomyelitis of S5 and the coccyx, and potentially of  S4, based on proximity of the ulcer.Soft tissue gas extends into the sacral canal to the level of S2-S3, subcutaneously to the level of L5-S1, and laterally along the left gluteus maximus muscle. Extension of gas towards the left ischiorectal fossa and the anus, suggesting the possibility of a perianal fistula. There is no drainable fluid collection on noncontrast CT.    Review of Systems:  Review of Systems  Constitutional:  Positive for chills and malaise/fatigue.  HENT: Negative.         Hard of hearing.  Eyes: Negative.   Respiratory:  Positive for shortness of breath.   Cardiovascular: Negative.   Gastrointestinal: Negative.   Genitourinary:  Positive for frequency.  Musculoskeletal: Negative.   Skin: Negative.   Neurological:  Positive for weakness.       Confusion, altered mentation  Endo/Heme/Allergies: Negative.   Psychiatric/Behavioral: Negative.     Past Medical History:  Diagnosis Date   Cancer ([place])    COPD (chronic obstructive pulmonary disease) ([place])    Coronary artery disease    Diabetes mellitus without complication ([place])    Hypercholesterolemia    Hypertension     Past Surgical History:  Procedure Laterality Date   ABDOMINAL HYSTERECTOMY     BREAST SURGERY     lumpectomy   CARDIAC SURGERY     EYE SURGERY     cataract     reports that she has been smoking cigarettes. She has never used smokeless tobacco. She reports that she does not currently use alcohol. She reports that she does not use drugs.  No Known Allergies  Family History  Problem Relation Age of Onset   Cancer Father    Family  history reviewed and not pertinent.  Prior to Admission medications   Medication Sig Start Date End Date Taking? Authorizing Provider  albuterol (VENTOLIN HFA) 108 (90 Base) MCG/ACT inhaler Inhale 2 puffs into the lungs every 6 (six) hours as needed for wheezing or shortness of breath. 02/12/20   Juline Patch, MD  aspirin 325 MG tablet Take 325 mg by mouth  daily.    [provider]  Calcium 200 MG TABS Take 1 tablet by mouth in the morning and at bedtime.    [provider]  clopidogrel (PLAVIX) 75 MG tablet Take 1 tablet (75 mg total) by mouth daily. 07/15/20   Juline Patch, MD  feeding supplement, GLUCERNA SHAKE, (GLUCERNA SHAKE) LIQD Take 237 mLs by mouth 3 (three) times daily between meals. 01/19/21   Bonnielee Haff, MD  ferrous sulfate 325 (65 FE) MG EC tablet Take 325 mg by mouth daily with breakfast.    [provider]  furosemide (LASIX) 20 MG tablet Take 20 mg by mouth daily. 06/11/21   [provider]  losartan (COZAAR) 100 MG tablet Take 1 tablet (100 mg total) by mouth daily. 07/15/20   Juline Patch, MD  lovastatin (MEVACOR) 40 MG tablet Take 1 tablet (40 mg total) by mouth at bedtime. 07/15/20   Juline Patch, MD  melatonin 1 MG TABS tablet Take 2 mg by mouth at bedtime.    [provider]  metFORMIN (GLUCOPHAGE) 1000 MG tablet TAKE 1 TABLET BY MOUTH TWICE DAILY BEFORE MEAL(S) 11/24/20   Juline Patch, MD  metoprolol tartrate (LOPRESSOR) 50 MG tablet Take 1 tablet (50 mg total) by mouth 2 (two) times daily. 07/15/20   Juline Patch, MD  Multiple Vitamins-Minerals (ONE-A-DAY WOMENS 50+ ADVANTAGE PO) Take 1 tablet by mouth daily.    [provider]  OXYGEN Inhale into the lungs at bedtime.    [provider]  polyethylene glycol (MIRALAX / GLYCOLAX) 17 g packet Take 17 g by mouth daily. 01/19/21   Bonnielee Haff, MD  potassium chloride SA (KLOR-CON) 10 MEQ tablet Take 1 tablet (10 mEq total) by mouth daily. 01/19/21   Bonnielee Haff, MD  SANTYL ointment Apply 1 application topically daily. 06/12/21   [provider]  senna-docusate (SENOKOT-S) 8.6-50 MG tablet Take 1 tablet by mouth at bedtime as needed for mild constipation. 01/19/21   Bonnielee Haff, MD  vitamin B-12 1000 MCG tablet Take 1 tablet (1,000 mcg total) by mouth daily. 01/19/21   Bonnielee Haff, MD     Physical Exam: Vitals:   06/15/21 1559 06/15/21 1630 06/15/21 1800 06/15/21 1830  BP:  (!) 106/43 (!) 110/47 (!) 108/51  Pulse:  98 100 98  Resp:  20 (!) 30 (!) 23  Temp:      TempSrc:      SpO2:  98% 90% 100%  Weight: 61.2 kg     Height: 5\' 5"  (1.651 m)       Constitutional: Appears comfortable, not in any distress, following partial commands. Vitals:   06/15/21 1559 06/15/21 1630 06/15/21 1800 06/15/21 1830  BP:  (!) 106/43 (!) 110/47 (!) 108/51  Pulse:  98 100 98  Resp:  20 (!) 30 (!) 23  Temp:      TempSrc:      SpO2:  98% 90% 100%  Weight: 61.2 kg     Height: 5\' 5"  (1.651 m)      Eyes: PERRL, lids and conjunctivae normal ENMT: Mucous membranes are dry.  Posterior pharynx without exudate. Neck: normal, supple, no masses, no thyromegaly Respiratory: Clear to auscultation bilaterally, no wheezing, no crackles, no accessory muscle use. Cardiovascular: S1-S2 heard, regular rate and rhythm, no murmur. Abdomen: Soft, nontender, nondistended, BS+ Musculoskeletal: No edema, no cyanosis, no clubbing. Skin: Chronic decubitus ulcer on the right and left sacrum, unstageable. Neurologic: Cranial nerves not assessed, Alert and oriented x 2 following commands. psychiatric: Normal mood, following commands.   Labs on Admission: I have personally reviewed following labs and imaging studies  CBC: Recent Labs  Lab 06/15/21 1550  WBC 18.4*  NEUTROABS 16.1*  HGB 7.7*  HCT 25.0*  MCV 102.0*  PLT 814*   Basic Metabolic Panel: Recent Labs  Lab 06/15/21 1550 06/15/21 1818  NA 139 141  K 6.4* 5.6*  CL 105 111  CO2 29 24  GLUCOSE 153* 122*  BUN 74* 67*  CREATININE 1.80* 1.58*  CALCIUM 9.9 8.9   GFR: Estimated Creatinine Clearance: 23.4 mL/min (A) (by C-G formula based on SCr of 1.58 mg/dL (H)). Liver Function Tests: Recent Labs  Lab 06/15/21 1550  AST 44*  ALT 33  ALKPHOS 118  BILITOT 0.6  PROT 6.3*  ALBUMIN 2.1*   No results for input(s): LIPASE, AMYLASE in  the last 168 hours. No results for input(s): AMMONIA in the last 168 hours. Coagulation Profile: Recent Labs  Lab 06/15/21 1551  INR 1.1   Cardiac Enzymes: No results for input(s): CKTOTAL, CKMB, CKMBINDEX, TROPONINI in the last 168 hours. BNP (last 3 results) No results for input(s): PROBNP in the last 8760 hours. HbA1C: No results for input(s): HGBA1C in the last 72 hours. CBG: No results for input(s): GLUCAP in the last 168 hours. Lipid Profile: No results for input(s): CHOL, HDL, LDLCALC, TRIG, CHOLHDL, LDLDIRECT in the last 72 hours. Thyroid Function Tests: No results for input(s): TSH, T4TOTAL, FREET4, T3FREE, THYROIDAB in the last 72 hours. Anemia Panel: No results for input(s): VITAMINB12, FOLATE, FERRITIN, TIBC, IRON, RETICCTPCT in the last 72 hours. Urine analysis:    Component Value Date/Time   COLORURINE YELLOW (A) 06/15/2021 1638   APPEARANCEUR CLOUDY (A) 06/15/2021 1638   LABSPEC 1.014 06/15/2021 1638   PHURINE 5.0 06/15/2021 1638   GLUCOSEU NEGATIVE 06/15/2021 1638   HGBUR SMALL (A) 06/15/2021 1638   BILIRUBINUR NEGATIVE 06/15/2021 1638   KETONESUR NEGATIVE 06/15/2021 1638   PROTEINUR NEGATIVE 06/15/2021 1638   NITRITE NEGATIVE 06/15/2021 1638   LEUKOCYTESUR MODERATE (A) 06/15/2021 1638    Radiological Exams on Admission: CT ABDOMEN PELVIS WO CONTRAST  Result Date: 06/15/2021 CLINICAL DATA:  Sepsis Large necrotic looking sacral decub with surrounding blanching redness consistent with cellulitis and foul smell EXAM: CT ABDOMEN AND PELVIS WITHOUT CONTRAST TECHNIQUE: Multidetector CT imaging of the abdomen and pelvis was performed following the standard protocol without IV contrast. RADIATION DOSE REDUCTION: This exam was performed according to the departmental dose-optimization program which includes automated exposure control, adjustment of the mA and/or kV according to patient size and/or use of iterative reconstruction technique. COMPARISON:  None. FINDINGS:  Lower chest: Bibasilar scarring and atelectasis. There is a 9 x 5 mm (average 7 mm) nodule in the left lung base (sagittal series 7, image 97). No acute abnormality. Hepatobiliary: No focal liver abnormality is seen. The gallbladder is unremarkable. Pancreas: Unremarkable. No pancreatic ductal dilatation or surrounding inflammatory changes. Spleen: Normal in size without focal abnormality. Adrenals/Urinary Tract: There is bilateral adrenal thickening with low-density 1.2 cm nodule in the left adrenal gland, statistically likely to be an  adenoma. No hydronephrosis or nephrolithiasis. There is mild bilateral cortical renal atrophy. There is a exophytic right renal cyst, density consistent with a simple cyst. The bladder is unremarkable. Stomach/Bowel: Tiny hiatal hernia. The stomach is otherwise within normal limits. There is no evidence of bowel obstruction.The appendix is not definitively visualized, no right lower quadrant inflammation/in the region of the cecum. Vascular/Lymphatic: Aortoiliac atherosclerotic calcifications. No AAA. No lymphadenopathy. Reproductive: Prior hysterectomy. Other: No bowel containing hernia.  No free air.  No ascites. Musculoskeletal: There is a deep sacral decubitus ulcer extending to the sacrum and coccyx, with probable underlying osteomyelitis of S5 and the coccyx and potentially S4 based on the proximity of the ulcer. There is no frank bony destruction at this time but there is potential intraosseous gas within two coccygeal bones (sagittal image 88). There is gas extending into the sacral canal (sagittal image 86). There is extensive subcutaneous gas extending up the back to the level of L5-S1. Gas extends along the left gluteus maximus muscle in there appears to be a gaseous connection coursing towards the ischial rectal fossa in the anus (series 3, images 75-79). There is no drainable fluid collection on noncontrast CT. No evidence of acute fracture. Multilevel degenerative  changes of the spine, most severe at L2-L3. Moderate bilateral hip osteoarthritis. IMPRESSION: Deep sacral decubitus ulcer extending to the sacrum and coccyx, with probable underlying osteomyelitis of S5 and the coccyx, and potentially of S4, based on proximity of the ulcer. No frank bony destruction at this time. Soft tissue gas extends into the sacral canal to the level of S2-S3, subcutaneously to the level of L5-S1, and laterally along the left gluteus maximus muscle. Extension of gas towards the left ischiorectal fossa and the anus, suggesting the possibility of a perianal fistula. There is no drainable fluid collection on noncontrast CT. Indeterminate 7 mm pulmonary nodule in the left lung base. Non-contrast chest CT at 6-12 months is recommended. If the nodule is stable at time of repeat CT, then future CT at 18-24 months (from today's scan) is considered optional for low-risk patients, but is recommended for high-risk patients. This recommendation follows the consensus statement: Guidelines for Management of Incidental Pulmonary Nodules Detected on CT Images: From the Fleischner Society 2017; Radiology 2017; 284:228-243. Electronically Signed   By: Maurine Simmering M.D.   On: 06/15/2021 17:22   CT Head Wo Contrast  Result Date: 06/15/2021 CLINICAL DATA:  Altered mental status, weakness, decreased appetite EXAM: CT HEAD WITHOUT CONTRAST TECHNIQUE: Contiguous axial images were obtained from the base of the skull through the vertex without intravenous contrast. RADIATION DOSE REDUCTION: This exam was performed according to the departmental dose-optimization program which includes automated exposure control, adjustment of the mA and/or kV according to patient size and/or use of iterative reconstruction technique. COMPARISON:  01/12/2021 FINDINGS: Brain: Stable atrophy pattern and chronic white matter microvascular ischemic changes throughout both cerebral hemispheres. No acute intracranial hemorrhage, mass lesion,  new infarction, midline shift, herniation, hydrocephalus, or extra-axial fluid collection. Cerebellar atrophy as well. Vascular: Intracranial atherosclerosis.  No hyperdense vessel. Skull: Normal. Negative for fracture or focal lesion. Sinuses/Orbits: No acute finding. Other: None. IMPRESSION: Stable atrophy and white matter microvascular ischemic changes. No interval change or acute process by noncontrast CT. Electronically Signed   By: Jerilynn Mages.  Shick M.D.   On: 06/15/2021 17:11   DG Chest Portable 1 View  Result Date: 06/15/2021 CLINICAL DATA:  Increasing shortness of breath, evaluate for CHF EXAM: PORTABLE CHEST 1 VIEW COMPARISON:  Radiograph  06/15/2021 FINDINGS: Unchanged cardiomediastinal silhouette. Central pulmonary vascular congestion and mild diffuse interstitial opacities. There are patchy lower lung airspace opacities. No large pleural effusion. No visible pneumothorax. There is no acute osseous abnormality. IMPRESSION: Findings suggestive of mild pulmonary edema and/or multifocal infection. Electronically Signed   By: Maurine Simmering M.D.   On: 06/15/2021 18:32   DG Chest Portable 1 View  Result Date: 06/15/2021 CLINICAL DATA:  Week.  Weakness and decreased appetite. EXAM: PORTABLE CHEST 1 VIEW COMPARISON:  01/12/2021 FINDINGS: Cardiac silhouette and mediastinal contours are unchanged and within normal limits. Mild bilateral interstitial thickening is similar to prior. Improved aeration of the lung bases. The prior tiny pleural effusions non-small visualized on the current frontal radiograph. No pneumothorax is seen. No acute osseous abnormality. IMPRESSION: Mild bilateral interstitial thickening is similar to prior. Mildly improved aeration of the lung bases compared to yesterday. Electronically Signed   By: Yvonne Kendall M.D.   On: 06/15/2021 17:19    EKG: Independently reviewed.  Normal sinus rhythm, short PR interval, RBBB  Assessment/Plan Principal Problem:   Sepsis secondary to UTI  [facility]) Active Problems:   HTN (hypertension)   Type 2 diabetes mellitus with hyperlipidemia ([facility])   Hyperkalemia   Decubitus ulcer   COPD with hypoxia ([facility])   Tobacco use   Failure to thrive in adult   Acute metabolic encephalopathy   Severe sepsis could be multifactorial. Patient presented with confusion, tachycardia, tachypnea, hypotension,  lactic acid 2.6, UTI, chronic decubitus ulcers. Likely secondary to UTI and chronic decubitus ulcer. Continue empiric antibiotics for now (cefepime, vancomycin and Flagyl.) Follow-up urine and blood cultures. De-escalate antibiotics based on clinical improvement and after goals of care discussion.  Hyperkalemia: She is found to have K+ 6.4 on admission. Patient has received IV hydration. Lokelma ordered. Recheck potassium in few hours.  Acute metabolic encephalopathy: Patient continued to remain confused at the SNF. This could be multifactorial, sepsis, failure to thrive, AKI, UTI Continue IV hydration, IV antibiotics Patient seems improved, following commands.  Acute kidney injury: > Improving Could be secondary to sepsis, failure to thrive. Baseline serum creatinine normal,  creatinine at admission 1.80 Continue IV hydration.  Avoid nephrotoxic medications,  Leukocytosis: Could be secondary to UTI and chronic decubitus ulcers consistent with osteomyelitis.   Continue IV antibiotics.  Trend WBC.  Essential hypertension: Hold blood pressure medications since blood pressure has been running on the soft side.  Chronic decubitus ulcers: Patient does have chronic decubitus ulcers deep, unstageable. CT AP showed osteomyelitis, gas consistent with perianal fistula. No drainable abscess noted on the CT scan. General surgery consulted, awaiting recommendation. Daughter is clear that patient does not want surgical intervention. Wound care consult.  Elevated troponins: This could be due to demand ischemia in the setting of  sepsis. Patient denies any chest pain.  EKG shows normal sinus rhythm.  Goals of care discussion: Discussed with the daughter at length.  Patient has poor prognosis. Daughter understood and agreeable with palliative care consult for possible goals of care discussion.   She is clear that she does not want her mother to suffer and wants her comfortable, she does not want heroic efforts.   DVT prophylaxis: SCDs Code Status:DNR Family Communication:  Disposition Plan:    Status is: Inpatient Remains inpatient appropriate because: Admitted for severe sepsis secondary to UTI/chronic decubitus ulcers.  Requiring IV antibiotics  Consults called: None Admission status: Inpatient   Shawna Clamp MD [facility]   If 7PM-7AM, please contact night-coverage  06/15/2021, 6:53 PM

## 2021-06-15 NOTE — Progress Notes (Signed)
CODE SEPSIS - PHARMACY COMMUNICATION  **Broad Spectrum Antibiotics should be administered within 1 hour of Sepsis diagnosis**  Time Code Sepsis Called/Page Received: 1638  Antibiotics Ordered: vancomycin, metronidazole, cefepime  Time of 1st antibiotic administration: [place] ,PharmD Clinical Pharmacist  06/15/2021  4:42 PM

## 2021-06-15 NOTE — Sepsis Progress Note (Signed)
Sepsis protocol monitored by eLink 

## 2021-06-16 ENCOUNTER — Encounter: Payer: Self-pay | Admitting: Family Medicine

## 2021-06-16 DIAGNOSIS — A419 Sepsis, unspecified organism: Secondary | ICD-10-CM | POA: Diagnosis not present

## 2021-06-16 DIAGNOSIS — Z7189 Other specified counseling: Secondary | ICD-10-CM | POA: Diagnosis not present

## 2021-06-16 DIAGNOSIS — Z515 Encounter for palliative care: Secondary | ICD-10-CM | POA: Diagnosis not present

## 2021-06-16 DIAGNOSIS — N390 Urinary tract infection, site not specified: Secondary | ICD-10-CM | POA: Diagnosis not present

## 2021-06-16 LAB — BLOOD CULTURE ID PANEL (REFLEXED) - BCID2
A.calcoaceticus-baumannii: NOT DETECTED
A.calcoaceticus-baumannii: NOT DETECTED
Bacteroides fragilis: NOT DETECTED
Bacteroides fragilis: NOT DETECTED
CTX-M ESBL: NOT DETECTED
Candida albicans: NOT DETECTED
Candida albicans: NOT DETECTED
Candida auris: NOT DETECTED
Candida auris: NOT DETECTED
Candida glabrata: NOT DETECTED
Candida glabrata: NOT DETECTED
Candida krusei: NOT DETECTED
Candida krusei: NOT DETECTED
Candida parapsilosis: NOT DETECTED
Candida parapsilosis: NOT DETECTED
Candida tropicalis: NOT DETECTED
Candida tropicalis: NOT DETECTED
Carbapenem resist OXA 48 LIKE: NOT DETECTED
Carbapenem resistance IMP: NOT DETECTED
Carbapenem resistance KPC: NOT DETECTED
Carbapenem resistance NDM: NOT DETECTED
Carbapenem resistance VIM: NOT DETECTED
Cryptococcus neoformans/gattii: NOT DETECTED
Cryptococcus neoformans/gattii: NOT DETECTED
Enterobacter cloacae complex: NOT DETECTED
Enterobacter cloacae complex: NOT DETECTED
Enterobacterales: DETECTED — AB
Enterobacterales: NOT DETECTED
Enterococcus Faecium: NOT DETECTED
Enterococcus Faecium: NOT DETECTED
Enterococcus faecalis: NOT DETECTED
Enterococcus faecalis: NOT DETECTED
Escherichia coli: NOT DETECTED
Escherichia coli: NOT DETECTED
Haemophilus influenzae: NOT DETECTED
Haemophilus influenzae: NOT DETECTED
Klebsiella aerogenes: NOT DETECTED
Klebsiella aerogenes: NOT DETECTED
Klebsiella oxytoca: NOT DETECTED
Klebsiella oxytoca: NOT DETECTED
Klebsiella pneumoniae: NOT DETECTED
Klebsiella pneumoniae: NOT DETECTED
Listeria monocytogenes: NOT DETECTED
Listeria monocytogenes: NOT DETECTED
Neisseria meningitidis: NOT DETECTED
Neisseria meningitidis: NOT DETECTED
Proteus species: DETECTED — AB
Proteus species: NOT DETECTED
Pseudomonas aeruginosa: NOT DETECTED
Pseudomonas aeruginosa: NOT DETECTED
Salmonella species: NOT DETECTED
Salmonella species: NOT DETECTED
Serratia marcescens: NOT DETECTED
Serratia marcescens: NOT DETECTED
Staphylococcus aureus (BCID): NOT DETECTED
Staphylococcus aureus (BCID): NOT DETECTED
Staphylococcus epidermidis: NOT DETECTED
Staphylococcus epidermidis: NOT DETECTED
Staphylococcus lugdunensis: NOT DETECTED
Staphylococcus lugdunensis: NOT DETECTED
Staphylococcus species: DETECTED — AB
Staphylococcus species: NOT DETECTED
Stenotrophomonas maltophilia: NOT DETECTED
Stenotrophomonas maltophilia: NOT DETECTED
Streptococcus agalactiae: NOT DETECTED
Streptococcus agalactiae: NOT DETECTED
Streptococcus pneumoniae: NOT DETECTED
Streptococcus pneumoniae: NOT DETECTED
Streptococcus pyogenes: NOT DETECTED
Streptococcus pyogenes: NOT DETECTED
Streptococcus species: NOT DETECTED
Streptococcus species: NOT DETECTED

## 2021-06-16 LAB — PHOSPHORUS: Phosphorus: <1 mg/dL — CL (ref 2.5–4.6)

## 2021-06-16 LAB — ABO/RH: ABO/RH(D): A POS

## 2021-06-16 LAB — COMPREHENSIVE METABOLIC PANEL
ALT: 26 U/L (ref 0–44)
AST: 24 U/L (ref 15–41)
Albumin: 1.6 g/dL — ABNORMAL LOW (ref 3.5–5.0)
Alkaline Phosphatase: 77 U/L (ref 38–126)
Anion gap: 4 — ABNORMAL LOW (ref 5–15)
BUN: 75 mg/dL — ABNORMAL HIGH (ref 8–23)
CO2: 26 mmol/L (ref 22–32)
Calcium: 9.1 mg/dL (ref 8.9–10.3)
Chloride: 113 mmol/L — ABNORMAL HIGH (ref 98–111)
Creatinine, Ser: 1.49 mg/dL — ABNORMAL HIGH (ref 0.44–1.00)
GFR, Estimated: 34 mL/min — ABNORMAL LOW (ref >60)
Glucose, Bld: 122 mg/dL — ABNORMAL HIGH (ref 70–99)
Potassium: 5.4 mmol/L — ABNORMAL HIGH (ref 3.5–5.1)
Sodium: 143 mmol/L (ref 135–145)
Total Bilirubin: 0.5 mg/dL (ref 0.3–1.2)
Total Protein: 4.9 g/dL — ABNORMAL LOW (ref 6.5–8.1)

## 2021-06-16 LAB — TYPE AND SCREEN
ABO/RH(D): A POS
Antibody Screen: NEGATIVE
Unit division: 0

## 2021-06-16 LAB — BPAM RBC
Blood Product Expiration Date: 202302142359
ISSUE DATE / TIME: 202302140936
Unit Type and Rh: 6200

## 2021-06-16 LAB — CORTISOL-AM, BLOOD: Cortisol - AM: 32.3 ug/dL — ABNORMAL HIGH (ref 6.7–22.6)

## 2021-06-16 LAB — CBC
HCT: 18.9 % — ABNORMAL LOW (ref 36.0–46.0)
Hemoglobin: 6 g/dL — ABNORMAL LOW (ref 12.0–15.0)
MCH: 31.3 pg (ref 26.0–34.0)
MCHC: 31.7 g/dL (ref 30.0–36.0)
MCV: 98.4 fL (ref 80.0–100.0)
Platelets: 413 10*3/uL — ABNORMAL HIGH (ref 150–400)
RBC: 1.92 MIL/uL — ABNORMAL LOW (ref 3.87–5.11)
RDW: 13.6 % (ref 11.5–15.5)
WBC: 16.2 10*3/uL — ABNORMAL HIGH (ref 4.0–10.5)
nRBC: 0 % (ref 0.0–0.2)

## 2021-06-16 LAB — MAGNESIUM: Magnesium: 2 mg/dL (ref 1.7–2.4)

## 2021-06-16 LAB — PROTIME-INR
INR: 1.3 — ABNORMAL HIGH (ref 0.8–1.2)
Prothrombin Time: 15.8 seconds — ABNORMAL HIGH (ref 11.4–15.2)

## 2021-06-16 LAB — PROCALCITONIN: Procalcitonin: 2.43 ng/mL

## 2021-06-16 MED ORDER — LACTATED RINGERS IV BOLUS
500.0000 mL | Freq: Once | INTRAVENOUS | Status: AC
  Administered 2021-06-16: 500 mL via INTRAVENOUS

## 2021-06-16 MED ORDER — LORAZEPAM 2 MG/ML PO CONC: 1.0000 mg | ORAL | Status: DC | PRN

## 2021-06-16 MED ORDER — HALOPERIDOL LACTATE 5 MG/ML IJ SOLN: 0.5000 mg | INTRAMUSCULAR | Status: DC | PRN

## 2021-06-16 MED ORDER — POLYVINYL ALCOHOL 1.4 % OP SOLN
1.0000 [drp] | Freq: Four times a day (QID) | OPHTHALMIC | Status: DC | PRN
  Filled 2021-06-16: qty 15

## 2021-06-16 MED ORDER — ONDANSETRON HCL 4 MG/2ML IJ SOLN: 4.0000 mg | Freq: Four times a day (QID) | INTRAMUSCULAR | Status: DC | PRN

## 2021-06-16 MED ORDER — GLYCOPYRROLATE 0.2 MG/ML IJ SOLN: 0.2000 mg | INTRAMUSCULAR | Status: DC | PRN

## 2021-06-16 MED ORDER — HALOPERIDOL 0.5 MG PO TABS
0.5000 mg | ORAL_TABLET | ORAL | Status: DC | PRN
  Filled 2021-06-16: qty 1

## 2021-06-16 MED ORDER — MORPHINE SULFATE 10 MG/5ML PO SOLN
2.5000 mg | ORAL | Status: DC | PRN
  Administered 2021-06-17: 15:00:00 5 mg via ORAL
  Administered 2021-06-17: 11:00:00 2.5 mg via ORAL
  Filled 2021-06-16 (×2): qty 5
  Filled 2021-06-16 (×2): qty 4

## 2021-06-16 MED ORDER — HALOPERIDOL LACTATE 2 MG/ML PO CONC
0.5000 mg | ORAL | Status: DC | PRN
  Filled 2021-06-16: qty 0.3

## 2021-06-16 MED ORDER — SODIUM CHLORIDE 0.9 % IV BOLUS
500.0000 mL | Freq: Once | INTRAVENOUS | Status: AC
  Administered 2021-06-16: 500 mL via INTRAVENOUS

## 2021-06-16 MED ORDER — DEXTROSE 5 % IV SOLN
30.0000 mmol | Freq: Once | INTRAVENOUS | Status: DC
  Administered 2021-06-16: 30 mmol via INTRAVENOUS
  Filled 2021-06-16: qty 10

## 2021-06-16 MED ORDER — ONDANSETRON 4 MG PO TBDP
4.0000 mg | ORAL_TABLET | Freq: Four times a day (QID) | ORAL | Status: DC | PRN
  Filled 2021-06-16: qty 1

## 2021-06-16 MED ORDER — ACETAMINOPHEN 325 MG PO TABS: 650.0000 mg | ORAL_TABLET | Freq: Four times a day (QID) | ORAL | Status: DC | PRN

## 2021-06-16 MED ORDER — GLYCOPYRROLATE 1 MG PO TABS
1.0000 mg | ORAL_TABLET | ORAL | Status: DC | PRN
  Filled 2021-06-16: qty 1

## 2021-06-16 MED ORDER — BIOTENE DRY MOUTH MT LIQD: 15.0000 mL | OROMUCOSAL | Status: DC | PRN

## 2021-06-16 MED ORDER — ACETAMINOPHEN 650 MG RE SUPP: 650.0000 mg | Freq: Four times a day (QID) | RECTAL | Status: DC | PRN

## 2021-06-16 MED ORDER — SODIUM CHLORIDE 0.9% IV SOLUTION: Freq: Once | INTRAVENOUS | Status: AC

## 2021-06-16 NOTE — Progress Notes (Signed)
[facility]) Hospital Liaison Note  Hospice Home eligibility confirmed. Hospice Home is able to offer a bed tomorrow 2.15. Consents will need to be completed and signed by family prior to transport being setup. Hospital Liaison will contact [facility] with timeline for transfer tomorrow.  Daughter [name] has been notified and aware of plan.  Please call with any hospice questions or concerns.  Thank you, Margaretmary Eddy, BSN, RN [facility] Liaison [phone]

## 2021-06-16 NOTE — Consult Note (Signed)
WOC Nurse Consult Note: Reason for Consult: Large, Unstageable pressure injury to sacrum with fluctuance.  Dr. Lutricia Feil (Surgery) saw last evening the ED. Please see his note from that encounter.  Wound type: Pressure Injury POA: Yes/No/NA Measurement: Per Dr. Christian Mate, 8cm x 8cm black eschar with fluctuance Wound bed:As noted above Drainage (amount, consistency, odor) None Periwound: mild induration Dressing procedure/placement/frequency:  I discussed the topical POC for this wound with Dr. Posey Pronto via [place].  I will provide conservative topical care guidance for the care of this wound with the immediate goal of keeping the eschar stable and the patient comfortable. Twice daily and PRN soiling cleansing of the eschar and surrounding skin with normal saline will be followed by gently drying the skin and applying povidone-iodine swabsticks to the eschar. This will act as both an astringent and antimicrobial. When the povidone iodine solution is dry, the eschar is to be covered with dry gauze and topped with an ABD pad for cushioning and secured with tape. An ABD pad is recommended over a silicone adhesive foam as we are attempting to stabilize the eschar and avoid autolytic debridement or physical disruption during foam removal. The patient is to be turned from side to side in accordance with our house protocol and time in the supine position minimized.  The heels are to be floated for additional pressure injury prophylaxis. I have communicated the above POC to the Bedside RN, P. Mmaitsi via Secure Chat.  [facility] nursing team will not follow, but will remain available to this patient, the nursing and medical teams.  Please re-consult if needed. Thanks, Maudie Flakes, MSN, RN, [place], [facility]  Pager# [phone]

## 2021-06-16 NOTE — Progress Notes (Signed)
[facility] hospital Liaison note:   Notified by Lawerance Bach of family interest in the [facility] hospice home for end of life care. Hospice home eligibility is pending hospice physician review. Family and hospital care team aware. Liaison will continue to follow.  Thank you for this referral.  Flo Shanks BSN, RN, [place] [phone]

## 2021-06-16 NOTE — TOC Initial Note (Signed)
Transition of Care [facility]) - Initial/Assessment Note    Patient Details  Name: Beth Franco MRN: [id number] Date of Birth: [birth year]  Transition of Care [facility]) CM/SW Contact:    Shelbie Hutching, RN Phone Number: 06/16/2021, 12:30 PM  Clinical Narrative:                 Patient admitted to the hospital with sepsis related to UTI.  RNCM met with patient and her daughter [name] at the bedside.  Patient is not very responsive, she has her eyes open but not responding.  Daughter reports that she is upset with [facility], where the patient is from.  [name] believes that they should have sent the patient to the hospital last week when she first started having symptoms. Daughter has decided to stop any life prolonging measures and wants her mother to be comfortable.  Daughter is agreeable to Hospice not sure if she wants the patient to go back to [facility] with Hospice or to the hospice home.   Daughter agrees to speak with Olivia Mackie from [facility] about the Hospice Home.  Olivia Mackie will come and speak with daughter, [name].    Expected Discharge Plan: [place] Barriers to Discharge: Continued Medical Work up   Patient Goals and CMS Choice Patient states their goals for this hospitalization and ongoing recovery are:: Daughter just wants the patient to be comfortable CMS Medicare.gov Compare Post Acute Care list provided to:: Patient Represenative (must comment) Choice offered to / list presented to : Adult Children  Expected Discharge Plan and Services Expected Discharge Plan: [place]   Discharge Planning Services: CM Consult Post Acute Care Choice: Residential Hospice Bed Living arrangements for the past 2 months: Single Family Home                 DME Arranged: N/A DME Agency: NA       HH Arranged: NA HH Agency: NA        Prior Living Arrangements/Services Living arrangements for the past 2 months: [place] with:: Facility  Resident Patient language and need for interpreter reviewed:: Yes        Need for Family Participation in Patient Care: Yes (Comment) Care giver support system in place?: Yes (comment)   Criminal Activity/Legal Involvement Pertinent to Current Situation/Hospitalization: No - Comment as needed  Activities of Daily Living Home Assistive Devices/Equipment: Hospital bed, Other (Comment) (pt resides in SNF) ADL Screening (condition at time of admission) Patient's cognitive ability adequate to safely complete daily activities?: No Is the patient deaf or have difficulty hearing?: No Does the patient have difficulty seeing, even when wearing glasses/contacts?: No Does the patient have difficulty concentrating, remembering, or making decisions?: Yes Patient able to express need for assistance with ADLs?: Yes Does the patient have difficulty dressing or bathing?: Yes Independently performs ADLs?: No Communication: Dependent Is this a change from baseline?: Pre-admission baseline Dressing (OT): Dependent Is this a change from baseline?: Pre-admission baseline Grooming: Dependent Is this a change from baseline?: Pre-admission baseline Feeding: Dependent Is this a change from baseline?: Pre-admission baseline Bathing: Dependent Is this a change from baseline?: Pre-admission baseline Toileting: Dependent Is this a change from baseline?: Pre-admission baseline In/Out Bed: Dependent Is this a change from baseline?: Pre-admission baseline Walks in Home: Dependent Is this a change from baseline?: Pre-admission baseline Does the patient have difficulty walking or climbing stairs?: Yes Weakness of Legs: Both Weakness of Arms/Hands: Both  Permission Sought/Granted Permission sought to  share information with : Case Manager, Customer service manager, Family Supports Permission granted to share information with : Yes, Verbal Permission Granted  Share Information with NAME: Cynda Familia  Permission granted to share info w AGENCY: [place] granted to share info w Relationship: daughter  Permission granted to share info w Contact Information: [phone]  Emotional Assessment Appearance:: Appears stated age Attitude/Demeanor/Rapport: Unresponsive Affect (typically observed): Unable to Assess Orientation: : Oriented to Self Alcohol / Substance Use: Not Applicable Psych Involvement: No (comment)  Admission diagnosis:  Severe sepsis with acute organ dysfunction ([place]) [A41.9, R65.20] Patient Active Problem List   Diagnosis Date Noted   Sepsis secondary to UTI ([place]) 06/15/2021   Hyperkalemia 06/15/2021   Decubitus ulcer 06/15/2021   COPD with hypoxia ([place]) 06/15/2021   Tobacco use 06/15/2021   Failure to thrive in adult 31/59/4585   Acute metabolic encephalopathy 92/92/4462   Weakness    Pneumonia 01/12/2021   Acute lower UTI 01/12/2021   HTN (hypertension) 01/12/2021   Type 2 diabetes mellitus with hyperlipidemia ([place]) 01/12/2021   PCP:  Juline Patch, MD Pharmacy:   [facility] [address], [place] [address] Phone: [phone] Fax: [phone]     Social Determinants of Health (SDOH) Interventions    Readmission Risk Interventions No flowsheet data found.

## 2021-06-16 NOTE — Progress Notes (Signed)
[employer] [facility] [employer] NAME: Beth Franco    MR#:  [id number]  DATE OF BIRTH:  [birth year]  SUBJECTIVE:   daughter Beth Franco at bedside patient was brought in from [facility] with confusion, poor PO intake, failure to thrive, significant sacral ulcer  VITALS:  Blood pressure (!) 115/55, pulse 91, temperature 98 F (36.7 C), temperature source Axillary, resp. rate (!) 23, height 5\' 5"  (1.651 m), weight 53.6 kg, SpO2 100 %.  PHYSICAL EXAMINATION:   GENERAL:  87 y.o.-year-old patient lying in the bed with no acute distress. Malnourished 1011 LUNGS: Normal breath sounds bilaterally, no wheezing, rales, rhonchi.  CARDIOVASCULAR: S1, S2 normal. No murmurs, rubs, or gallops.   EXTREMITIES: No  edema b/l.    NEUROLOGIC: nonfocal  patient is alert and awake SKIN:  Measurement: Per Dr. Christian Mate, 8cm x 8cm black eschar with fluctuance.Large, Unstageable pressure injury to sacrum with fluctuance.  LABORATORY PANEL:  CBC Recent Labs  Lab 06/16/21 0444  WBC 16.2*  HGB 6.0*  HCT 18.9*  PLT 413*    Chemistries  Recent Labs  Lab 06/16/21 0444  NA 143  K 5.4*  CL 113*  CO2 26  GLUCOSE 122*  BUN 75*  CREATININE 1.49*  CALCIUM 9.1  MG 2.0  AST 24  ALT 26  ALKPHOS 77  BILITOT 0.5   Cardiac Enzymes No results for input(s): TROPONINI in the last 168 hours. RADIOLOGY:  CT ABDOMEN PELVIS WO CONTRAST  Result Date: 06/15/2021 CLINICAL DATA:  Sepsis Large necrotic looking sacral decub with surrounding blanching redness consistent with cellulitis and foul smell EXAM: CT ABDOMEN AND PELVIS WITHOUT CONTRAST TECHNIQUE: Multidetector CT imaging of the abdomen and pelvis was performed following the standard protocol without IV contrast. RADIATION DOSE REDUCTION: This exam was performed according to the departmental dose-optimization program which includes automated exposure control, adjustment of the mA and/or kV according to patient size  and/or use of iterative reconstruction technique. COMPARISON:  None. FINDINGS: Lower chest: Bibasilar scarring and atelectasis. There is a 9 x 5 mm (average 7 mm) nodule in the left lung base (sagittal series 7, image 97). No acute abnormality. Hepatobiliary: No focal liver abnormality is seen. The gallbladder is unremarkable. Pancreas: Unremarkable. No pancreatic ductal dilatation or surrounding inflammatory changes. Spleen: Normal in size without focal abnormality. Adrenals/Urinary Tract: There is bilateral adrenal thickening with low-density 1.2 cm nodule in the left adrenal gland, statistically likely to be an adenoma. No hydronephrosis or nephrolithiasis. There is mild bilateral cortical renal atrophy. There is a exophytic right renal cyst, density consistent with a simple cyst. The bladder is unremarkable. Stomach/Bowel: Tiny hiatal hernia. The stomach is otherwise within normal limits. There is no evidence of bowel obstruction.The appendix is not definitively visualized, no right lower quadrant inflammation/in the region of the cecum. Vascular/Lymphatic: Aortoiliac atherosclerotic calcifications. No AAA. No lymphadenopathy. Reproductive: Prior hysterectomy. Other: No bowel containing hernia.  No free air.  No ascites. Musculoskeletal: There is a deep sacral decubitus ulcer extending to the sacrum and coccyx, with probable underlying osteomyelitis of S5 and the coccyx and potentially S4 based on the proximity of the ulcer. There is no frank bony destruction at this time but there is potential intraosseous gas within two coccygeal bones (sagittal image 88). There is gas extending into the sacral canal (sagittal image 86). There is extensive subcutaneous gas extending up the back to the level of L5-S1. Gas extends along the left gluteus maximus muscle in there  appears to be a gaseous connection coursing towards the ischial rectal fossa in the anus (series 3, images 75-79). There is no drainable fluid collection  on noncontrast CT. No evidence of acute fracture. Multilevel degenerative changes of the spine, most severe at L2-L3. Moderate bilateral hip osteoarthritis. IMPRESSION: Deep sacral decubitus ulcer extending to the sacrum and coccyx, with probable underlying osteomyelitis of S5 and the coccyx, and potentially of S4, based on proximity of the ulcer. No frank bony destruction at this time. Soft tissue gas extends into the sacral canal to the level of S2-S3, subcutaneously to the level of L5-S1, and laterally along the left gluteus maximus muscle. Extension of gas towards the left ischiorectal fossa and the anus, suggesting the possibility of a perianal fistula. There is no drainable fluid collection on noncontrast CT. Indeterminate 7 mm pulmonary nodule in the left lung base. Non-contrast chest CT at 6-12 months is recommended. If the nodule is stable at time of repeat CT, then future CT at 18-24 months (from today's scan) is considered optional for low-risk patients, but is recommended for high-risk patients. This recommendation follows the consensus statement: Guidelines for Management of Incidental Pulmonary Nodules Detected on CT Images: From the Fleischner Society 2017; Radiology 2017; 284:228-243. Electronically Signed   By: Maurine Simmering M.D.   On: 06/15/2021 17:22   CT Head Wo Contrast  Result Date: 06/15/2021 CLINICAL DATA:  Altered mental status, weakness, decreased appetite EXAM: CT HEAD WITHOUT CONTRAST TECHNIQUE: Contiguous axial images were obtained from the base of the skull through the vertex without intravenous contrast. RADIATION DOSE REDUCTION: This exam was performed according to the departmental dose-optimization program which includes automated exposure control, adjustment of the mA and/or kV according to patient size and/or use of iterative reconstruction technique. COMPARISON:  01/12/2021 FINDINGS: Brain: Stable atrophy pattern and chronic white matter microvascular ischemic changes throughout  both cerebral hemispheres. No acute intracranial hemorrhage, mass lesion, new infarction, midline shift, herniation, hydrocephalus, or extra-axial fluid collection. Cerebellar atrophy as well. Vascular: Intracranial atherosclerosis.  No hyperdense vessel. Skull: Normal. Negative for fracture or focal lesion. Sinuses/Orbits: No acute finding. Other: None. IMPRESSION: Stable atrophy and white matter microvascular ischemic changes. No interval change or acute process by noncontrast CT. Electronically Signed   By: Jerilynn Mages.  Shick M.D.   On: 06/15/2021 17:11   DG Chest Portable 1 View  Result Date: 06/15/2021 CLINICAL DATA:  Increasing shortness of breath, evaluate for CHF EXAM: PORTABLE CHEST 1 VIEW COMPARISON:  Radiograph 06/15/2021 FINDINGS: Unchanged cardiomediastinal silhouette. Central pulmonary vascular congestion and mild diffuse interstitial opacities. There are patchy lower lung airspace opacities. No large pleural effusion. No visible pneumothorax. There is no acute osseous abnormality. IMPRESSION: Findings suggestive of mild pulmonary edema and/or multifocal infection. Electronically Signed   By: Maurine Simmering M.D.   On: 06/15/2021 18:32   DG Chest Portable 1 View  Result Date: 06/15/2021 CLINICAL DATA:  Week.  Weakness and decreased appetite. EXAM: PORTABLE CHEST 1 VIEW COMPARISON:  01/12/2021 FINDINGS: Cardiac silhouette and mediastinal contours are unchanged and within normal limits. Mild bilateral interstitial thickening is similar to prior. Improved aeration of the lung bases. The prior tiny pleural effusions non-small visualized on the current frontal radiograph. No pneumothorax is seen. No acute osseous abnormality. IMPRESSION: Mild bilateral interstitial thickening is similar to prior. Mildly improved aeration of the lung bases compared to yesterday. Electronically Signed   By: Yvonne Kendall M.D.   On: 06/15/2021 17:19    Assessment and Plan   [facility]  is a 87 y.o. female with PMH significant  for COPD, chronic smoker 70+ years, chronic hypoxic respiratory failure on 2 L of supplemental oxygen at baseline, CAD, diabetes melitis, hypercholesterolemia, hypertension presented to the ED from his skilled nursing facility with altered mentation, failure to thrive, decreased p.o. intake  Severe sepsis multifactorial--POA -- patient presented with confusion, tachycardia, tachypnea hypotension lactic acid of 2.6 with abnormal urine, chest x-ray costs consistent with multifocal pneumonia and significantly infected decubitus with soft tissue gas and suspected Osteomyelitis multifocal pneumonia, UTI, severe large unstageable decubitus ulcer present on admission Hyperkalemia adult failure to thrive, metabolic encephalopathy, acute kidney injury  patient was started on IV fluids, broad-spectrum antibiotics and surgical consultation was obtained. Patient's daughter Beth Franco request no aggressive intervention given overall very poor prognosis. Discussed at length with daughter regarding overall poor prognosis and discuss options of hospice. Daughter is in very much agreement with hospice for patient. Hospice referral made. Discussed with daughter all active treatment will be discontinued. Patient will be placed on comfort care. She is in agreement with it.  Discharged to hospice facility once approved    Procedures: Family communication : daughter Beth Franco Consults : surgery Dr. Christian Mate CODE STATUS: DNR DNI DVT Prophylaxis : comfort care Level of care: Med-Surg Status is: Inpatient Remains inpatient appropriate because: awaiting approval for hospice facility            TOTAL TIME TAKING CARE OF THIS PATIENT: 25 minutes.  >50% time spent on counselling and coordination of care  Note: This dictation was prepared with Dragon dictation along with smaller phrase technology. Any transcriptional errors that result from this process are unintentional.  Fritzi Mandes M.D    [facility]   CC: Primary care physician; Juline Patch, MD

## 2021-06-16 NOTE — TOC Progression Note (Signed)
Transition of Care [facility]) - Progression Note    Patient Details  Name: Beth Franco MRN: [id number] Date of Birth: [birth year]  Transition of Care [facility]) CM/SW Contact  Shelbie Hutching, RN Phone Number: 06/16/2021, 3:14 PM  Clinical Narrative:    Olivia Mackie with [facility] is seeing if she can get approval for the Hospice Home.     Expected Discharge Plan: [place] Barriers to Discharge: Continued Medical Work up  Expected Discharge Plan and Services Expected Discharge Plan: [place]   Discharge Planning Services: CM Consult Post Acute Care Choice: Residential Hospice Bed Living arrangements for the past 2 months: Single Family Home                 DME Arranged: N/A DME Agency: NA       HH Arranged: NA HH Agency: NA         Social Determinants of Health (SDOH) Interventions    Readmission Risk Interventions No flowsheet data found.

## 2021-06-16 NOTE — Progress Notes (Signed)
PHARMACY - PHYSICIAN COMMUNICATION CRITICAL VALUE ALERT - BLOOD CULTURE IDENTIFICATION (BCID)  Beth Franco is an 87 y.o. female who presented to [facility] on 06/15/2021 with a chief complaint of confusion and failure to thrive  Assessment:  Blood cultures with GPC and GNR in 1 of 2 sets, BCID detects Staphylococcus species (not S aureus) and Proteus. Patient is comfort care  Name of physician (or Provider) Contacted: Dr Posey Pronto  Current antibiotics: None   Changes to prescribed antibiotics recommended:  Patient is comfort care  Results for orders placed or performed during the hospital encounter of 06/15/21  Blood Culture ID Panel (Reflexed) (Collected: 06/15/2021  3:50 PM)  Result Value Ref Range   Enterococcus faecalis NOT DETECTED NOT DETECTED   Enterococcus Faecium NOT DETECTED NOT DETECTED   Listeria monocytogenes NOT DETECTED NOT DETECTED   Staphylococcus species DETECTED (A) NOT DETECTED   Staphylococcus aureus (BCID) NOT DETECTED NOT DETECTED   Staphylococcus epidermidis NOT DETECTED NOT DETECTED   Staphylococcus lugdunensis NOT DETECTED NOT DETECTED   Streptococcus species NOT DETECTED NOT DETECTED   Streptococcus agalactiae NOT DETECTED NOT DETECTED   Streptococcus pneumoniae NOT DETECTED NOT DETECTED   Streptococcus pyogenes NOT DETECTED NOT DETECTED   A.calcoaceticus-baumannii NOT DETECTED NOT DETECTED   Bacteroides fragilis NOT DETECTED NOT DETECTED   Enterobacterales NOT DETECTED NOT DETECTED   Enterobacter cloacae complex NOT DETECTED NOT DETECTED   Escherichia coli NOT DETECTED NOT DETECTED   Klebsiella aerogenes NOT DETECTED NOT DETECTED   Klebsiella oxytoca NOT DETECTED NOT DETECTED   Klebsiella pneumoniae NOT DETECTED NOT DETECTED   Proteus species NOT DETECTED NOT DETECTED   Salmonella species NOT DETECTED NOT DETECTED   Serratia marcescens NOT DETECTED NOT DETECTED   Haemophilus influenzae NOT DETECTED NOT DETECTED   Neisseria meningitidis NOT DETECTED NOT  DETECTED   Pseudomonas aeruginosa NOT DETECTED NOT DETECTED   Stenotrophomonas maltophilia NOT DETECTED NOT DETECTED   Candida albicans NOT DETECTED NOT DETECTED   Candida auris NOT DETECTED NOT DETECTED   Candida glabrata NOT DETECTED NOT DETECTED   Candida krusei NOT DETECTED NOT DETECTED   Candida parapsilosis NOT DETECTED NOT DETECTED   Candida tropicalis NOT DETECTED NOT DETECTED   Cryptococcus neoformans/gattii NOT DETECTED NOT DETECTED    Doreene Eland, PharmD, BCPS, BCIDP Work Cell: [phone] 06/16/2021 1:18 PM

## 2021-06-16 NOTE — Consult Note (Signed)
Consultation Note Date: 06/16/2021   Patient Name: Beth Franco  DOB: 1934-12-15  MRN: [id number]  Age / Sex: 87 y.o., female  PCP: Juline Patch, MD Referring Physician: Fritzi Mandes, MD  Reason for Consultation: Establishing goals of care  HPI/Patient Profile: 87 y.o. female  with past medical history of COPD, chronic smoker 70+ years, chronic hypoxic respiratory failure on 2 L, CAD, DM, HTN/HLD, resident of long-term care at [facility] admitted on 06/15/2021 with sepsis secondary to UTI.   Clinical Assessment and Goals of Care: I have reviewed medical records including EPIC notes, labs and imaging, received report from RN, assessed the patient.  Beth Franco is resting quietly in bed.  She appears acutely/chronically ill and frail, elderly.  She does not open her eyes to voice or touch and clearly cannot make her basic needs known.  There is no family at bedside at this time.  Conference with bedside nursing staff.    Secure chat from team stating that daughter, Beth Franco, is at bedside.  I meet with Beth Franco at bedside to discuss diagnosis prognosis, GOC, EOL wishes, disposition and options.  I introduced Palliative Medicine as specialized medical care for people living with serious illness. It focuses on providing relief from the symptoms and stress of a serious illness. The goal is to improve quality of life for both the patient and the family.  We focused on their current illness.  At this point family is requesting no further curative treatments.  Instead they are requesting comfort and dignity, let nature take its course.  The natural disease trajectory and expectations at EOL were discussed.  Hospice Care services outpatient were explained and offered.  We talk in detail about what is and is not provided with residential hospice and the benefits of this service.  Beth Franco is agreeable to residential hospice  referral. We talk about transfer to residential hospice.  I shared that if Beth Franco is unstable, she would not be transferred.  We talk about premedication prior to transfer.  Discussed the importance of continued conversation with family and the medical providers regarding overall plan of care and treatment options, ensuring decisions are within the context of the patients values and GOCs. Questions and concerns were addressed.  The family was encouraged to call with questions or concerns.  PMT will continue to support holistically.  Conference with attending, bedside nursing staff, transition of care team, and house [facility] representative related to patient condition, needs, comfort care, residential hospice referral.   HCPOA   NEXT OF KIN -daughter, Beth Franco along with son Beth Franco.     SUMMARY OF RECOMMENDATIONS   Requesting comfort and dignity at end-of-life, residential hospice with [facility] in [place]: DNR  Symptom Management:  End-of-life order set implemented  Palliative Prophylaxis:  Frequent Pain Assessment, Oral Care, and Turn Reposition  Additional Recommendations (Limitations, Scope, Preferences): Full Comfort Care  Psycho-social/Spiritual:  Desire for further Chaplaincy support:no Additional Recommendations: Caregiving  Support/Resources and Education  on Hospice  Prognosis:  < 2 weeks, days to weeks anticipated based on acuity of illness, unresponsiveness, poor p.o. intake, family's desire to focus on comfort and dignity, let nature take its course.  Discharge Planning:  Requesting comfort and dignity at end-of-life, residential hospice with [facility] in [place].       Primary Diagnoses: Present on Admission:  HTN (hypertension)  Type 2 diabetes mellitus with hyperlipidemia ([place])   I have reviewed the medical record, interviewed the patient and family, and examined the patient. The following  aspects are pertinent.  Past Medical History:  Diagnosis Date   Cancer ([place])    COPD (chronic obstructive pulmonary disease) ([place])    Coronary artery disease    Diabetes mellitus without complication ([place])    Hypercholesterolemia    Hypertension    Social History   Socioeconomic History   Marital status: Widowed    Spouse name: Not on file   Number of children: Not on file   Years of education: Not on file   Highest education level: Not on file  Occupational History   Not on file  Tobacco Use   Smoking status: Every Day    Types: Cigarettes   Smokeless tobacco: Never  Vaping Use   Vaping Use: Never used  Substance and Sexual Activity   Alcohol use: Not Currently   Drug use: Never   Sexual activity: Not Currently  Other Topics Concern   Not on file  Social History Narrative   Not on file   Social Determinants of Health   Financial Resource Strain: Not on file  Food Insecurity: Not on file  Transportation Needs: Not on file  Physical Activity: Not on file  Stress: Not on file  Social Connections: Not on file   Family History  Problem Relation Age of Onset   Cancer Father    Scheduled Meds:  Chlorhexidine Gluconate Cloth  6 each Topical Q0600   Continuous Infusions: PRN Meds:.acetaminophen **OR** acetaminophen, albuterol, ondansetron **OR** ondansetron (ZOFRAN) IV Medications Prior to Admission:  Prior to Admission medications   Medication Sig Start Date End Date Taking? Authorizing Provider  aspirin 81 MG chewable tablet Chew 81 mg by mouth daily.   Yes [provider]  clopidogrel (PLAVIX) 75 MG tablet Take 1 tablet (75 mg total) by mouth daily. 07/15/20  Yes Juline Patch, MD  losartan (COZAAR) 100 MG tablet Take 1 tablet (100 mg total) by mouth daily. 07/15/20  Yes Juline Patch, MD  lovastatin (MEVACOR) 40 MG tablet Take 1 tablet (40 mg total) by mouth at bedtime. 07/15/20  Yes Juline Patch, MD  metFORMIN (GLUCOPHAGE) 1000 MG tablet TAKE 1  TABLET BY MOUTH TWICE DAILY BEFORE MEAL(S) Patient taking differently: Take 1,000 mg by mouth 2 (two) times daily with a meal. TAKE 1 TABLET BY MOUTH TWICE DAILY BEFORE MEAL(S) 11/24/20  Yes Juline Patch, MD  SANTYL ointment Apply 1 application topically daily. 06/12/21  Yes [provider]  albuterol (VENTOLIN HFA) 108 (90 Base) MCG/ACT inhaler Inhale 2 puffs into the lungs every 6 (six) hours as needed for wheezing or shortness of breath. 02/12/20   Juline Patch, MD  Calcium 200 MG TABS Take 1 tablet by mouth in the morning and at bedtime. Patient not taking: Reported on 06/15/2021    [provider]  Calcium Carb-Cholecalciferol (OYSTER SHELL CALCIUM W/D) 500-5 MG-MCG TABS Take 1 tablet by mouth 2 (two) times daily. 02/26/21   [provider]  feeding supplement,  GLUCERNA SHAKE, (GLUCERNA SHAKE) LIQD Take 237 mLs by mouth 3 (three) times daily between meals. 01/19/21   Bonnielee Haff, MD  ferrous sulfate 324 (65 Fe) MG TBEC Take 324 mg by mouth daily with breakfast. 02/26/21   [provider]  furosemide (LASIX) 20 MG tablet Take 20 mg by mouth daily. 06/11/21   [provider]  melatonin 1 MG TABS tablet Take 2 mg by mouth at bedtime.    [provider]  metoprolol tartrate (LOPRESSOR) 50 MG tablet Take 1 tablet (50 mg total) by mouth 2 (two) times daily. 07/15/20   Juline Patch, MD  Multiple Vitamins-Minerals (ONE-A-DAY WOMENS 50+ ADVANTAGE PO) Take 1 tablet by mouth daily.    [provider]  OXYGEN Inhale into the lungs at bedtime.    [provider]  polyethylene glycol (MIRALAX / GLYCOLAX) 17 g packet Take 17 g by mouth daily. 01/19/21   Bonnielee Haff, MD  potassium chloride SA (KLOR-CON) 10 MEQ tablet Take 1 tablet (10 mEq total) by mouth daily. 01/19/21   Bonnielee Haff, MD  senna-docusate (SENOKOT-S) 8.6-50 MG tablet Take 1 tablet by mouth at bedtime as needed for mild constipation. 01/19/21   Bonnielee Haff, MD   vitamin B-12 1000 MCG tablet Take 1 tablet (1,000 mcg total) by mouth daily. 01/19/21   Bonnielee Haff, MD   No Known Allergies Review of Systems  Unable to perform ROS: Acuity of condition   Physical Exam Vitals and nursing note reviewed.  Constitutional:      Appearance: She is ill-appearing.  HENT:     Mouth/Throat:     Mouth: Mucous membranes are dry.  Cardiovascular:     Rate and Rhythm: Normal rate.  Pulmonary:     Effort: Pulmonary effort is normal. No respiratory distress.  Neurological:     Comments: Does not open eyes to voice or touch    Vital Signs: BP (!) 115/55    Pulse 91    Temp 98 F (36.7 C) (Axillary)    Resp (!) 23    Ht 5\' 5"  (1.651 m)    Wt 53.6 kg    SpO2 98%    BMI 19.66 kg/m  Pain Scale: PAINAD   Pain Score: Asleep   SpO2: SpO2: 98 % O2 Device:SpO2: 98 % O2 Flow Rate: .O2 Flow Rate (L/min): 4 L/min  IO: Intake/output summary:  Intake/Output Summary (Last 24 hours) at 06/16/2021 1240 Last data filed at 06/16/2021 1000 Gross per 24 hour  Intake 2757.75 ml  Output 175 ml  Net 2582.75 ml    LBM: Last BM Date :  (PTA) Baseline Weight: Weight: 61.2 kg Most recent weight: Weight: 53.6 kg     Palliative Assessment/Data:   Flowsheet Rows    Flowsheet Row Most Recent Value  Intake Tab   Referral Department Hospitalist  Unit at Time of Referral Med/Surg Unit  Palliative Care Primary Diagnosis Sepsis/Infectious Disease  Date Notified 06/15/21  Palliative Care Type New Palliative care  Reason for referral Clarify Goals of Care, Counsel Regarding Hospice  Date of Admission 06/15/21  Date first seen by Palliative Care 06/16/21  # of days Palliative referral response time 1 Day(s)  # of days IP prior to Palliative referral 0  Clinical Assessment   Palliative Performance Scale Score 20%  Pain Max last 24 hours Not able to report  Pain Min Last 24 hours Not able to report  Dyspnea Max Last 24 Hours Not able to report  Dyspnea Min Last  24 hours  Not able to report  Psychosocial & Spiritual Assessment   Palliative Care Outcomes        Time In: 1300 Time Out: 1415 Time Total: 75 minutes  Greater than 50%  of this time was spent counseling and coordinating care related to the above assessment and plan.  Signed by: Drue Novel, NP   Please contact Palliative Medicine Team phone at [phone] for questions and concerns.  For individual provider: See Shea Evans

## 2021-06-16 NOTE — Plan of Care (Signed)
°  Problem: Respiratory: Goal: Ability to maintain a clear airway will improve Outcome: Progressing Goal: Levels of oxygenation will improve Outcome: Progressing Goal: Ability to maintain adequate ventilation will improve Outcome: Progressing   Problem: Clinical Measurements: Goal: Signs and symptoms of infection will decrease Outcome: Progressing   Problem: Fluid Volume: Goal: Hemodynamic stability will improve Outcome: Not Progressing  Pt hypotensive, LR bolus given.

## 2021-06-17 DIAGNOSIS — L089 Local infection of the skin and subcutaneous tissue, unspecified: Secondary | ICD-10-CM

## 2021-06-17 DIAGNOSIS — A419 Sepsis, unspecified organism: Secondary | ICD-10-CM

## 2021-06-17 DIAGNOSIS — E785 Hyperlipidemia, unspecified: Secondary | ICD-10-CM

## 2021-06-17 DIAGNOSIS — J449 Chronic obstructive pulmonary disease, unspecified: Secondary | ICD-10-CM | POA: Diagnosis not present

## 2021-06-17 DIAGNOSIS — G9341 Metabolic encephalopathy: Secondary | ICD-10-CM | POA: Diagnosis not present

## 2021-06-17 DIAGNOSIS — N179 Acute kidney failure, unspecified: Secondary | ICD-10-CM | POA: Diagnosis not present

## 2021-06-17 DIAGNOSIS — E1169 Type 2 diabetes mellitus with other specified complication: Secondary | ICD-10-CM

## 2021-06-17 DIAGNOSIS — R0902 Hypoxemia: Secondary | ICD-10-CM

## 2021-06-17 DIAGNOSIS — E875 Hyperkalemia: Secondary | ICD-10-CM

## 2021-06-17 DIAGNOSIS — L8994 Pressure ulcer of unspecified site, stage 4: Secondary | ICD-10-CM

## 2021-06-17 LAB — URINE CULTURE: Culture: >=100000 — AB

## 2021-06-17 LAB — PREPARE RBC (CROSSMATCH)

## 2021-06-17 MED ORDER — HALOPERIDOL LACTATE 2 MG/ML PO CONC: 0.5000 mg | ORAL | 0 refills | Status: AC | PRN

## 2021-06-17 MED ORDER — ONDANSETRON HCL 4 MG/2ML IJ SOLN: 4.0000 mg | Freq: Four times a day (QID) | INTRAMUSCULAR | 0 refills | Status: AC | PRN

## 2021-06-17 MED ORDER — ACETAMINOPHEN 650 MG RE SUPP: 650.0000 mg | Freq: Four times a day (QID) | RECTAL | 0 refills | Status: AC | PRN

## 2021-06-17 MED ORDER — ACETAMINOPHEN 325 MG PO TABS: 650.0000 mg | ORAL_TABLET | Freq: Four times a day (QID) | ORAL | Status: AC | PRN

## 2021-06-17 MED ORDER — GLYCOPYRROLATE 1 MG PO TABS: 1.0000 mg | ORAL_TABLET | ORAL | Status: AC | PRN

## 2021-06-17 MED ORDER — POLYETHYLENE GLYCOL 3350 17 G PO PACK: 17.0000 g | PACK | Freq: Every day | ORAL | 0 refills | Status: AC | PRN

## 2021-06-17 MED ORDER — GLYCOPYRROLATE 0.2 MG/ML IJ SOLN: 0.2000 mg | INTRAMUSCULAR | Status: AC | PRN

## 2021-06-17 MED ORDER — MORPHINE SULFATE 10 MG/5ML PO SOLN: 2.5000 mg | ORAL | 0 refills | Status: AC | PRN

## 2021-06-17 MED ORDER — HALOPERIDOL 0.5 MG PO TABS
0.5000 mg | ORAL_TABLET | ORAL | Status: AC | PRN
Start: 2021-06-17 — End: ?

## 2021-06-17 MED ORDER — ONDANSETRON 4 MG PO TBDP
4.0000 mg | ORAL_TABLET | Freq: Four times a day (QID) | ORAL | 0 refills | Status: AC | PRN
Start: 2021-06-17 — End: ?

## 2021-06-17 MED ORDER — BIOTENE DRY MOUTH MT LIQD: 15.0000 mL | OROMUCOSAL | Status: AC | PRN

## 2021-06-17 MED ORDER — POLYVINYL ALCOHOL 1.4 % OP SOLN: 1.0000 [drp] | Freq: Four times a day (QID) | OPHTHALMIC | 0 refills | Status: AC | PRN

## 2021-06-17 MED ORDER — SANTYL 250 UNIT/GM EX OINT: 1.0000 "application " | TOPICAL_OINTMENT | Freq: Every day | CUTANEOUS | 0 refills | Status: AC | PRN

## 2021-06-17 MED ORDER — LORAZEPAM 2 MG/ML PO CONC: 1.0000 mg | ORAL | 0 refills | Status: AC | PRN

## 2021-06-17 NOTE — Progress Notes (Signed)
Report called for hospice. Transportation scheduled for 1200.

## 2021-06-17 NOTE — Discharge Summary (Signed)
Physician Discharge Summary  Beth Franco VOH:[id number] DOB: Nov 20, 1934 DOA: 06/15/2021  PCP: Juline Patch, MD  Admit date: 06/15/2021 Discharge date: 06/17/2021  Admitted From: SNF Disposition: Hospice care  Recommendations for Outpatient Follow-up:  Follow up with hospice care on arrival    Discharge Condition:stable CODE STATUS:  Code Status: DNR  Regular healthy diet  Brief/Interim Summary: Beth Franco is a 87 y.o. female with PMH significant for COPD, chronic smoker 70+ years, chronic hypoxic respiratory failure on 2 L of supplemental oxygen at baseline, CAD, diabetes melitis, hypercholesterolemia, hypertension presented to the ED from his skilled nursing facility with altered mentation, failure to thrive, decreased p.o. intake.   Severe sepsis   multifactorial--POA  Sepsis criteria met with tachycardia, tachypnea, hypotension, lactic acidosis.  She had multiple contributing sources for infection including chest x-ray consistent with multifocal pneumonia, decubitus ulcer showing signs of osteomyelitis on imaging, positive urinalysis for UTI. Due to poor prognosis and quality of life, palliative care was consulted to discuss goals of care with patient and family.  Decision was made to transition patient to comfort care 2/14.  At that time, invasive and life-prolonging interventions that can be painful or anxiety provoking were discontinued in favor of providing patient with comfort. Patient remained stable and comfortable until arrangements could be made to transfer her to hospice care services.   Discharge Diagnoses:  Principal Problem:   Sepsis secondary to UTI [facility]) Active Problems:   HTN (hypertension)   Type 2 diabetes mellitus with hyperlipidemia (HCC)   Hyperkalemia   Decubitus ulcer   COPD with hypoxia ([place])   Tobacco use   Failure to thrive in adult   Acute metabolic encephalopathy   Allergies as of 06/17/2021   No Known Allergies      Medication List      STOP taking these medications    aspirin 81 MG chewable tablet   Calcium 200 MG Tabs   clopidogrel 75 MG tablet Commonly known as: PLAVIX   cyanocobalamin 1000 MCG tablet   feeding supplement (GLUCERNA SHAKE) Liqd   ferrous sulfate 324 (65 Fe) MG Tbec   furosemide 20 MG tablet Commonly known as: LASIX   losartan 100 MG tablet Commonly known as: COZAAR   lovastatin 40 MG tablet Commonly known as: MEVACOR   melatonin 1 MG Tabs tablet   metFORMIN 1000 MG tablet Commonly known as: GLUCOPHAGE   metoprolol tartrate 50 MG tablet Commonly known as: LOPRESSOR   ONE-A-DAY WOMENS 50+ ADVANTAGE PO   OXYGEN   Oyster Shell Calcium w/D 500-5 MG-MCG Tabs   potassium chloride 10 MEQ tablet Commonly known as: KLOR-CON M   senna-docusate 8.6-50 MG tablet Commonly known as: Senokot-S       TAKE these medications    acetaminophen 325 MG tablet Commonly known as: TYLENOL Take 2 tablets (650 mg total) by mouth every 6 (six) hours as needed for mild pain (or Fever >/= 101).   acetaminophen 650 MG suppository Commonly known as: TYLENOL Place 1 suppository (650 mg total) rectally every 6 (six) hours as needed for mild pain (or Fever >/= 101).   albuterol 108 (90 Base) MCG/ACT inhaler Commonly known as: VENTOLIN HFA Inhale 2 puffs into the lungs every 6 (six) hours as needed for wheezing or shortness of breath.   antiseptic oral rinse Liqd Apply 15 mLs topically as needed for dry mouth.   glycopyrrolate 1 MG tablet Commonly known as: ROBINUL Take 1 tablet (1 mg total) by mouth every 4 (four) hours as  needed (excessive secretions).   glycopyrrolate 0.2 MG/ML injection Commonly known as: ROBINUL Inject 1 mL (0.2 mg total) into the skin every 4 (four) hours as needed (excessive secretions).   haloperidol 0.5 MG tablet Commonly known as: HALDOL Take 1 tablet (0.5 mg total) by mouth every 4 (four) hours as needed for agitation (or delirium).   haloperidol 2 MG/ML  solution Commonly known as: HALDOL Place 0.3 mLs (0.6 mg total) under the tongue every 4 (four) hours as needed for agitation (or delirium).   LORazepam 2 MG/ML concentrated solution Commonly known as: ATIVAN Take 0.5-1 mLs (1-2 mg total) by mouth every 4 (four) hours as needed for anxiety or sedation (EOL care).   morphine 10 MG/5ML solution Take 1.3-2.5 mLs (2.6-5 mg total) by mouth every 2 (two) hours as needed for moderate pain (increased WOB/RR, EOL care).   ondansetron 4 MG disintegrating tablet Commonly known as: ZOFRAN-ODT Take 1 tablet (4 mg total) by mouth every 6 (six) hours as needed for nausea.   ondansetron 4 MG/2ML Soln injection Commonly known as: ZOFRAN Inject 2 mLs (4 mg total) into the vein every 6 (six) hours as needed for nausea.   polyethylene glycol 17 g packet Commonly known as: MIRALAX / GLYCOLAX Take 17 g by mouth daily as needed. What changed:  when to take this reasons to take this   polyvinyl alcohol 1.4 % ophthalmic solution Commonly known as: LIQUIFILM TEARS Place 1 drop into both eyes 4 (four) times daily as needed for dry eyes.   Santyl ointment Generic drug: collagenase Apply 1 application topically daily as needed. What changed:  when to take this reasons to take this        No Known Allergies  Consultations: Palliative care  Procedures/Studies: CT ABDOMEN PELVIS WO CONTRAST  Result Date: 06/15/2021 CLINICAL DATA:  Sepsis Large necrotic looking sacral decub with surrounding blanching redness consistent with cellulitis and foul smell EXAM: CT ABDOMEN AND PELVIS WITHOUT CONTRAST TECHNIQUE: Multidetector CT imaging of the abdomen and pelvis was performed following the standard protocol without IV contrast. RADIATION DOSE REDUCTION: This exam was performed according to the departmental dose-optimization program which includes automated exposure control, adjustment of the mA and/or kV according to patient size and/or use of iterative  reconstruction technique. COMPARISON:  None. FINDINGS: Lower chest: Bibasilar scarring and atelectasis. There is a 9 x 5 mm (average 7 mm) nodule in the left lung base (sagittal series 7, image 97). No acute abnormality. Hepatobiliary: No focal liver abnormality is seen. The gallbladder is unremarkable. Pancreas: Unremarkable. No pancreatic ductal dilatation or surrounding inflammatory changes. Spleen: Normal in size without focal abnormality. Adrenals/Urinary Tract: There is bilateral adrenal thickening with low-density 1.2 cm nodule in the left adrenal gland, statistically likely to be an adenoma. No hydronephrosis or nephrolithiasis. There is mild bilateral cortical renal atrophy. There is a exophytic right renal cyst, density consistent with a simple cyst. The bladder is unremarkable. Stomach/Bowel: Tiny hiatal hernia. The stomach is otherwise within normal limits. There is no evidence of bowel obstruction.The appendix is not definitively visualized, no right lower quadrant inflammation/in the region of the cecum. Vascular/Lymphatic: Aortoiliac atherosclerotic calcifications. No AAA. No lymphadenopathy. Reproductive: Prior hysterectomy. Other: No bowel containing hernia.  No free air.  No ascites. Musculoskeletal: There is a deep sacral decubitus ulcer extending to the sacrum and coccyx, with probable underlying osteomyelitis of S5 and the coccyx and potentially S4 based on the proximity of the ulcer. There is no frank bony destruction at this  time but there is potential intraosseous gas within two coccygeal bones (sagittal image 88). There is gas extending into the sacral canal (sagittal image 86). There is extensive subcutaneous gas extending up the back to the level of L5-S1. Gas extends along the left gluteus maximus muscle in there appears to be a gaseous connection coursing towards the ischial rectal fossa in the anus (series 3, images 75-79). There is no drainable fluid collection on noncontrast CT. No  evidence of acute fracture. Multilevel degenerative changes of the spine, most severe at L2-L3. Moderate bilateral hip osteoarthritis. IMPRESSION: Deep sacral decubitus ulcer extending to the sacrum and coccyx, with probable underlying osteomyelitis of S5 and the coccyx, and potentially of S4, based on proximity of the ulcer. No frank bony destruction at this time. Soft tissue gas extends into the sacral canal to the level of S2-S3, subcutaneously to the level of L5-S1, and laterally along the left gluteus maximus muscle. Extension of gas towards the left ischiorectal fossa and the anus, suggesting the possibility of a perianal fistula. There is no drainable fluid collection on noncontrast CT. Indeterminate 7 mm pulmonary nodule in the left lung base. Non-contrast chest CT at 6-12 months is recommended. If the nodule is stable at time of repeat CT, then future CT at 18-24 months (from today's scan) is considered optional for low-risk patients, but is recommended for high-risk patients. This recommendation follows the consensus statement: Guidelines for Management of Incidental Pulmonary Nodules Detected on CT Images: From the Fleischner Society 2017; Radiology 2017; 284:228-243. Electronically Signed   By: Maurine Simmering M.D.   On: 06/15/2021 17:22   CT Head Wo Contrast  Result Date: 06/15/2021 CLINICAL DATA:  Altered mental status, weakness, decreased appetite EXAM: CT HEAD WITHOUT CONTRAST TECHNIQUE: Contiguous axial images were obtained from the base of the skull through the vertex without intravenous contrast. RADIATION DOSE REDUCTION: This exam was performed according to the departmental dose-optimization program which includes automated exposure control, adjustment of the mA and/or kV according to patient size and/or use of iterative reconstruction technique. COMPARISON:  01/12/2021 FINDINGS: Brain: Stable atrophy pattern and chronic white matter microvascular ischemic changes throughout both cerebral  hemispheres. No acute intracranial hemorrhage, mass lesion, new infarction, midline shift, herniation, hydrocephalus, or extra-axial fluid collection. Cerebellar atrophy as well. Vascular: Intracranial atherosclerosis.  No hyperdense vessel. Skull: Normal. Negative for fracture or focal lesion. Sinuses/Orbits: No acute finding. Other: None. IMPRESSION: Stable atrophy and white matter microvascular ischemic changes. No interval change or acute process by noncontrast CT. Electronically Signed   By: Jerilynn Mages.  Shick M.D.   On: 06/15/2021 17:11   DG Chest Portable 1 View  Result Date: 06/15/2021 CLINICAL DATA:  Increasing shortness of breath, evaluate for CHF EXAM: PORTABLE CHEST 1 VIEW COMPARISON:  Radiograph 06/15/2021 FINDINGS: Unchanged cardiomediastinal silhouette. Central pulmonary vascular congestion and mild diffuse interstitial opacities. There are patchy lower lung airspace opacities. No large pleural effusion. No visible pneumothorax. There is no acute osseous abnormality. IMPRESSION: Findings suggestive of mild pulmonary edema and/or multifocal infection. Electronically Signed   By: Maurine Simmering M.D.   On: 06/15/2021 18:32   DG Chest Portable 1 View  Result Date: 06/15/2021 CLINICAL DATA:  Week.  Weakness and decreased appetite. EXAM: PORTABLE CHEST 1 VIEW COMPARISON:  01/12/2021 FINDINGS: Cardiac silhouette and mediastinal contours are unchanged and within normal limits. Mild bilateral interstitial thickening is similar to prior. Improved aeration of the lung bases. The prior tiny pleural effusions non-small visualized on the current frontal radiograph. No pneumothorax  is seen. No acute osseous abnormality. IMPRESSION: Mild bilateral interstitial thickening is similar to prior. Mildly improved aeration of the lung bases compared to yesterday. Electronically Signed   By: Yvonne Kendall M.D.   On: 06/15/2021 17:19    Subjective: Patient states that she overall is feeling okay.  She wants to drink some  water.  Agreeable to discharge today for hospice care.  Discharge Exam: Vitals:   06/16/21 1400 06/17/21 0534  BP: (!) 114/46 (!) 134/55  Pulse: 87 (!) 113  Resp: 19 (!) 21  Temp:  97.8 F (36.6 C)  SpO2: (!) 87% (!) 82%    General: Pt is alert, awake, not in acute distress Cardiovascular: Extremities warm Respiratory: Normal respiratory effort Extremities: no edema, no cyanosis  Labs: Basic Metabolic Panel: Recent Labs  Lab 06/15/21 1550 06/15/21 1818 06/16/21 0444  NA 139 141 143  K 6.4* 5.6* 5.4*  CL 105 111 113*  CO2 _0 GLUCOSE 153* 122* 122*  BUN 74* 67* 75*  CREATININE 1.80* 1.58* 1.49*  CALCIUM 9.9 8.9 9.1  MG  --   --  2.0  PHOS  --   --  <1.0*   CBC: Recent Labs  Lab 06/15/21 1550 06/16/21 0444  WBC 18.4* 16.2*  NEUTROABS 16.1*  --   HGB 7.7* 6.0*  HCT 25.0* 18.9*  MCV 102.0* 98.4  PLT 500* 413*    Microbiology Recent Results (from the past 240 hour(s))  Blood Culture (routine x 2)     Status: None (Preliminary result)   Collection Time: 06/15/21  3:50 PM   Specimen: BLOOD  Result Value Ref Range Status   Specimen Description   Final    BLOOD LEFT HAND Performed at [facility], [address] [place], [place] [address]    Special Requests   Final    BOTTLES DRAWN AEROBIC AND ANAEROBIC Blood Culture results may not be optimal due to an inadequate volume of blood received in culture bottles Performed at [facility], [address] [place], [place] [address]    Culture  Setup Time   Final    GRAM NEGATIVE RODS AEROBIC BOTTLE ONLY GRAM POSITIVE COCCI ANAEROBIC BOTTLE ONLY CRITICAL RESULT CALLED TO, READ BACK BY AND VERIFIED WITH: ALEX CHAPPEL 06/16/21 1305 MW    Culture GRAM NEGATIVE RODS GRAM POSITIVE COCCI   Final   Report Status PENDING  Incomplete  Blood Culture ID Panel (Reflexed)     Status: Abnormal   Collection Time: 06/15/21  3:50 PM  Result Value Ref Range Status   Enterococcus faecalis NOT  DETECTED NOT DETECTED Final   Enterococcus Faecium NOT DETECTED NOT DETECTED Final   Listeria monocytogenes NOT DETECTED NOT DETECTED Final   Staphylococcus species NOT DETECTED NOT DETECTED Final   Staphylococcus aureus (BCID) NOT DETECTED NOT DETECTED Final   Staphylococcus epidermidis NOT DETECTED NOT DETECTED Final   Staphylococcus lugdunensis NOT DETECTED NOT DETECTED Final   Streptococcus species NOT DETECTED NOT DETECTED Final   Streptococcus agalactiae NOT DETECTED NOT DETECTED Final   Streptococcus pneumoniae NOT DETECTED NOT DETECTED Final   Streptococcus pyogenes NOT DETECTED NOT DETECTED Final   A.calcoaceticus-baumannii NOT DETECTED NOT DETECTED Final   Bacteroides fragilis NOT DETECTED NOT DETECTED Final   Enterobacterales DETECTED (A) NOT DETECTED Final    Comment: Enterobacterales represent a large order of gram negative bacteria, not a single organism. CRITICAL RESULT CALLED TO, READ BACK BY AND VERIFIED WITH: ALEX CHAPPEL 06/16/21 1305  Enterobacter cloacae complex NOT DETECTED NOT DETECTED Final   Escherichia coli NOT DETECTED NOT DETECTED Final   Klebsiella aerogenes NOT DETECTED NOT DETECTED Final   Klebsiella oxytoca NOT DETECTED NOT DETECTED Final   Klebsiella pneumoniae NOT DETECTED NOT DETECTED Final   Proteus species DETECTED (A) NOT DETECTED Final    Comment: CRITICAL RESULT CALLED TO, READ BACK BY AND VERIFIED WITH: ALEX CHAPPEL 06/16/21 1305 MW    Salmonella species NOT DETECTED NOT DETECTED Final   Serratia marcescens NOT DETECTED NOT DETECTED Final   Haemophilus influenzae NOT DETECTED NOT DETECTED Final   Neisseria meningitidis NOT DETECTED NOT DETECTED Final   Pseudomonas aeruginosa NOT DETECTED NOT DETECTED Final   Stenotrophomonas maltophilia NOT DETECTED NOT DETECTED Final   Candida albicans NOT DETECTED NOT DETECTED Final   Candida auris NOT DETECTED NOT DETECTED Final   Candida glabrata NOT DETECTED NOT DETECTED Final   Candida krusei NOT  DETECTED NOT DETECTED Final   Candida parapsilosis NOT DETECTED NOT DETECTED Final   Candida tropicalis NOT DETECTED NOT DETECTED Final   Cryptococcus neoformans/gattii NOT DETECTED NOT DETECTED Final   CTX-M ESBL NOT DETECTED NOT DETECTED Final   Carbapenem resistance IMP NOT DETECTED NOT DETECTED Final   Carbapenem resistance KPC NOT DETECTED NOT DETECTED Final   Carbapenem resistance NDM NOT DETECTED NOT DETECTED Final   Carbapenem resist OXA 48 LIKE NOT DETECTED NOT DETECTED Final   Carbapenem resistance VIM NOT DETECTED NOT DETECTED Final    Comment: Performed at [facility], [address] [place], [place] [address]  Blood Culture ID Panel (Reflexed)     Status: Abnormal   Collection Time: 06/15/21  3:50 PM  Result Value Ref Range Status   Enterococcus faecalis NOT DETECTED NOT DETECTED Final   Enterococcus Faecium NOT DETECTED NOT DETECTED Final   Listeria monocytogenes NOT DETECTED NOT DETECTED Final   Staphylococcus species DETECTED (A) NOT DETECTED Final    Comment: CRITICAL RESULT CALLED TO, READ BACK BY AND VERIFIED WITH: ALEX CHAPPEL 06/16/21 1305 MW    Staphylococcus aureus (BCID) NOT DETECTED NOT DETECTED Final   Staphylococcus epidermidis NOT DETECTED NOT DETECTED Final   Staphylococcus lugdunensis NOT DETECTED NOT DETECTED Final   Streptococcus species NOT DETECTED NOT DETECTED Final   Streptococcus agalactiae NOT DETECTED NOT DETECTED Final   Streptococcus pneumoniae NOT DETECTED NOT DETECTED Final   Streptococcus pyogenes NOT DETECTED NOT DETECTED Final   A.calcoaceticus-baumannii NOT DETECTED NOT DETECTED Final   Bacteroides fragilis NOT DETECTED NOT DETECTED Final   Enterobacterales NOT DETECTED NOT DETECTED Final   Enterobacter cloacae complex NOT DETECTED NOT DETECTED Final   Escherichia coli NOT DETECTED NOT DETECTED Final   Klebsiella aerogenes NOT DETECTED NOT DETECTED Final   Klebsiella oxytoca NOT DETECTED NOT DETECTED Final   Klebsiella  pneumoniae NOT DETECTED NOT DETECTED Final   Proteus species NOT DETECTED NOT DETECTED Final   Salmonella species NOT DETECTED NOT DETECTED Final   Serratia marcescens NOT DETECTED NOT DETECTED Final   Haemophilus influenzae NOT DETECTED NOT DETECTED Final   Neisseria meningitidis NOT DETECTED NOT DETECTED Final   Pseudomonas aeruginosa NOT DETECTED NOT DETECTED Final   Stenotrophomonas maltophilia NOT DETECTED NOT DETECTED Final   Candida albicans NOT DETECTED NOT DETECTED Final   Candida auris NOT DETECTED NOT DETECTED Final   Candida glabrata NOT DETECTED NOT DETECTED Final   Candida krusei NOT DETECTED NOT DETECTED Final   Candida parapsilosis NOT DETECTED NOT DETECTED Final   Candida tropicalis NOT DETECTED  NOT DETECTED Final   Cryptococcus neoformans/gattii NOT DETECTED NOT DETECTED Final    Comment: Performed at [facility], [address] [place], [place] [address]  Resp Panel by RT-PCR (Flu A&B, Covid) Nasopharyngeal Swab     Status: None   Collection Time: 06/15/21  3:51 PM   Specimen: Nasopharyngeal Swab; Nasopharyngeal(NP) swabs in vial transport medium  Result Value Ref Range Status   SARS Coronavirus 2 by RT PCR NEGATIVE NEGATIVE Final    Comment: (NOTE) SARS-CoV-2 target nucleic acids are NOT DETECTED.  The SARS-CoV-2 RNA is generally detectable in upper respiratory specimens during the acute phase of infection. The lowest concentration of SARS-CoV-2 viral copies this assay can detect is 138 copies/mL. A negative result does not preclude SARS-Cov-2 infection and should not be used as the sole basis for treatment or other patient management decisions. A negative result may occur with  improper specimen collection/handling, submission of specimen other than nasopharyngeal swab, presence of viral mutation(s) within the areas targeted by this assay, and inadequate number of viral copies(<138 copies/mL). A negative result must be combined with clinical  observations, patient history, and epidemiological information. The expected result is Negative.  Fact Sheet for Patients:  EntrepreneurPulse.com.au  Fact Sheet for Healthcare Providers:  IncredibleEmployment.be  This test is no t yet approved or cleared by the [place] FDA and  has been authorized for detection and/or diagnosis of SARS-CoV-2 by FDA under an Emergency Use Authorization (EUA). This EUA will remain  in effect (meaning this test can be used) for the duration of the COVID-19 declaration under Section 564(b)(1) of the Act, 21 U.S.C.section 360bbb-3(b)(1), unless the authorization is terminated  or revoked sooner.       Influenza A by PCR NEGATIVE NEGATIVE Final   Influenza B by PCR NEGATIVE NEGATIVE Final    Comment: (NOTE) The Xpert Xpress SARS-CoV-2/FLU/RSV plus assay is intended as an aid in the diagnosis of influenza from Nasopharyngeal swab specimens and should not be used as a sole basis for treatment. Nasal washings and aspirates are unacceptable for Xpert Xpress SARS-CoV-2/FLU/RSV testing.  Fact Sheet for Patients: EntrepreneurPulse.com.au  Fact Sheet for Healthcare Providers: IncredibleEmployment.be  This test is not yet approved or cleared by the [place] FDA and has been authorized for detection and/or diagnosis of SARS-CoV-2 by FDA under an Emergency Use Authorization (EUA). This EUA will remain in effect (meaning this test can be used) for the duration of the COVID-19 declaration under Section 564(b)(1) of the Act, 21 U.S.C. section 360bbb-3(b)(1), unless the authorization is terminated or revoked.  Performed at [facility], [address] [place], [place] [address]   Blood Culture (routine x 2)     Status: None (Preliminary result)   Collection Time: 06/15/21  3:51 PM   Specimen: BLOOD  Result Value Ref Range Status   Specimen Description   Final     BLOOD RIGHT ASSIST CONTROL Performed at [facility], [address], [place], [place] [address]    Special Requests   Final    BOTTLES DRAWN AEROBIC AND ANAEROBIC Blood Culture adequate volume Performed at [facility], [address] [place], [place] [address]    Culture  Setup Time   Final    AEROBIC BOTTLE ONLY GRAM POSITIVE COCCI CRITICAL VALUE NOTED.  VALUE IS CONSISTENT WITH PREVIOUSLY REPORTED AND CALLED VALUE.    Culture GRAM POSITIVE COCCI  Final   Report Status PENDING  Incomplete  Urine Culture     Status:  Abnormal   Collection Time: 06/15/21  4:38 PM   Specimen: Urine, Clean Catch  Result Value Ref Range Status   Specimen Description   Final    URINE, CLEAN CATCH Performed at [facility], [address], [place], [place] [address]    Special Requests   Final    NONE Performed at [facility], [address] [place], [place] [address]    Culture (A)  Final    >=100,000 COLONIES/mL MULTIPLE SPECIES PRESENT, SUGGEST RECOLLECTION   Report Status 06/17/2021 FINAL  Final  MRSA Next Gen by PCR, Nasal     Status: None   Collection Time: 06/15/21  9:28 PM   Specimen: Nasal Mucosa; Nasal Swab  Result Value Ref Range Status   MRSA by PCR Next Gen NOT DETECTED NOT DETECTED Final    Comment: (NOTE) The GeneXpert MRSA Assay (FDA approved for NASAL specimens only), is one component of a comprehensive MRSA colonization surveillance program. It is not intended to diagnose MRSA infection nor to guide or monitor treatment for MRSA infections. Test performance is not FDA approved in patients less than 30 years old. Performed at [facility], [address], [place], [place] [address]     Time coordinating discharge: Over 30 minutes  Richarda Osmond, MD  [facility] 06/17/2021, 10:07 AM

## 2021-06-17 NOTE — Progress Notes (Signed)
[facility])   Consent forms to be completed at 10:30a at the [facility] by patient family.   EMS notified of patient D/C and transport arranged by TOC/Bridget. TOC/Bridget and Attending Physician/Dr. Ouida Sills also notified of transport arrangement.    Please send signed DNR form with patient and RN call report to [phone].    Daphene Calamity, MSW [facility] Liaison [phone]

## 2021-06-17 NOTE — TOC Transition Note (Signed)
Transition of Care [facility]) - CM/SW Discharge Note   Patient Details  Name: Beth Franco MRN: [id number] Date of Birth: May 13, 1934  Transition of Care [facility]) CM/SW Contact:  Eileen Stanford, LCSW Phone Number: 06/17/2021, 10:29 AM   Clinical Narrative:    Transport to [place] set for 12:00 PM. RN notified. EMS ppwk on chart.   Final next level of care: [place] Barriers to Discharge: No Barriers Identified   Patient Goals and CMS Choice Patient states their goals for this hospitalization and ongoing recovery are:: Daughter just wants the patient to be comfortable CMS Medicare.gov Compare Post Acute Care list provided to:: Patient Represenative (must comment) Choice offered to / list presented to : Adult Children  Discharge Placement              Patient chooses bed at:  [facility]) Patient to be transferred to facility by: [facility]   Patient and family notified of of transfer: 06/17/21  Discharge Plan and Services   Discharge Planning Services: CM Consult Post Acute Care Choice: Residential Hospice Bed          DME Arranged: N/A DME Agency: NA       HH Arranged: NA HH Agency: NA        Social Determinants of Health (SDOH) Interventions     Readmission Risk Interventions No flowsheet data found.

## 2021-06-18 LAB — CULTURE, BLOOD (ROUTINE X 2)

## 2021-06-24 LAB — CULTURE, BLOOD (ROUTINE X 2): Special Requests: ADEQUATE

## 2021-07-01 DEATH — deceased

## 2022-11-08 IMAGING — CT CT HEAD W/O CM
4 series · 16 of 47 positions shown, 18 images · non-contrast
Comparison: 01/12/2021

CLINICAL DATA: Altered mental status, weakness, decreased appetite



[Series 2: head wo · axial · 0.40mm/px · z∈[+523,+643]mm · 7 of 34 slices shown, 9 images]
[im 5/34  brain]
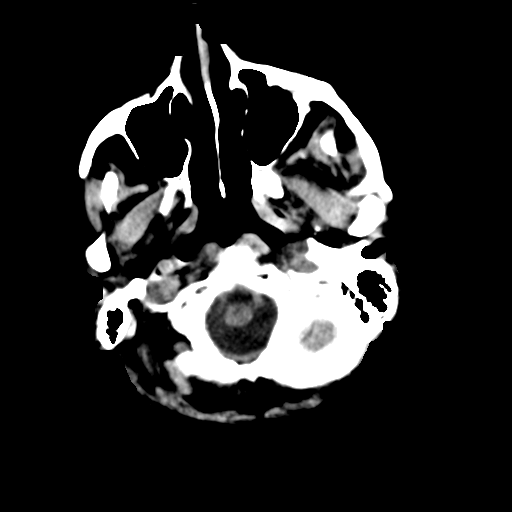
[im 5/34  bone]
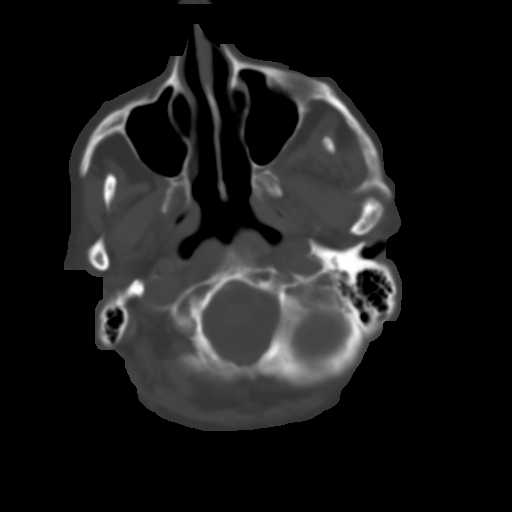
[im 9/34  brain]
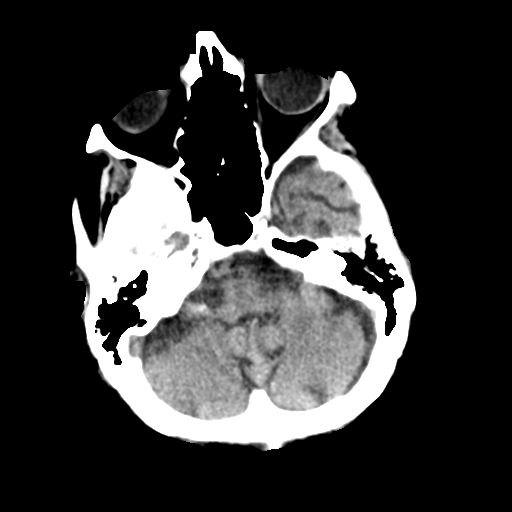
[im 13/34  brain]
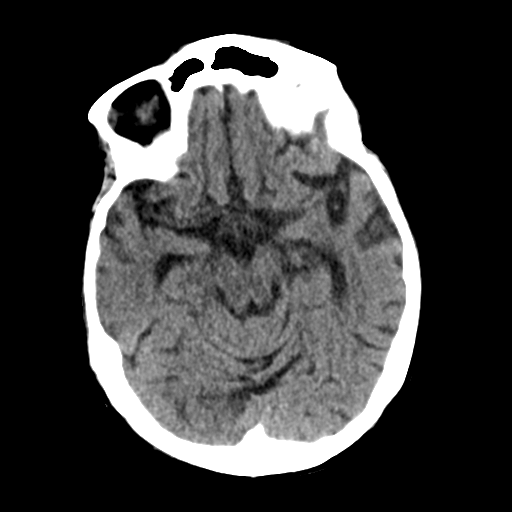
[im 17/34  brain]
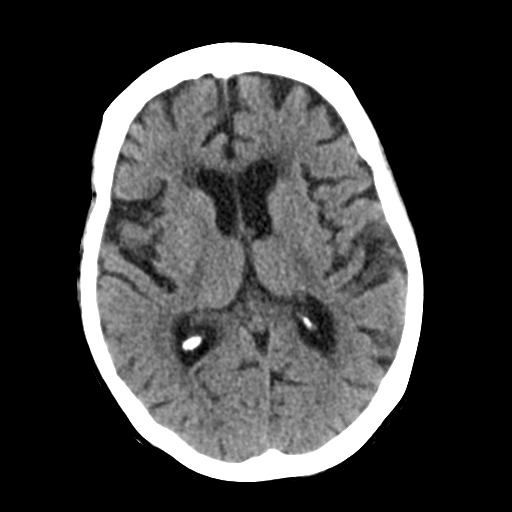
[im 21/34  brain]
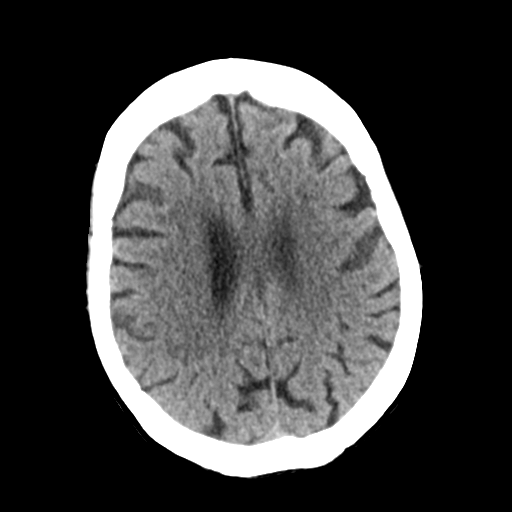
[im 21/34  bone]
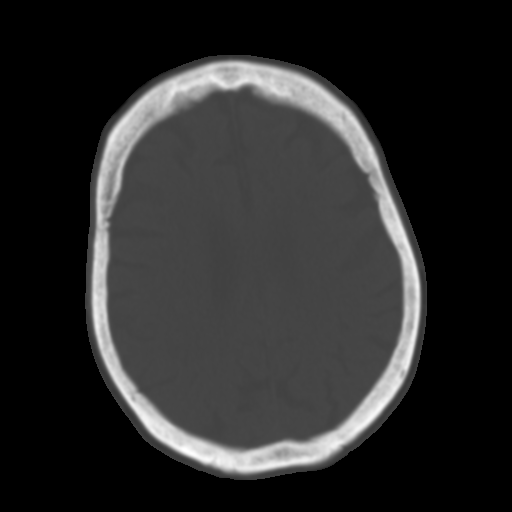
[im 25/34  brain]
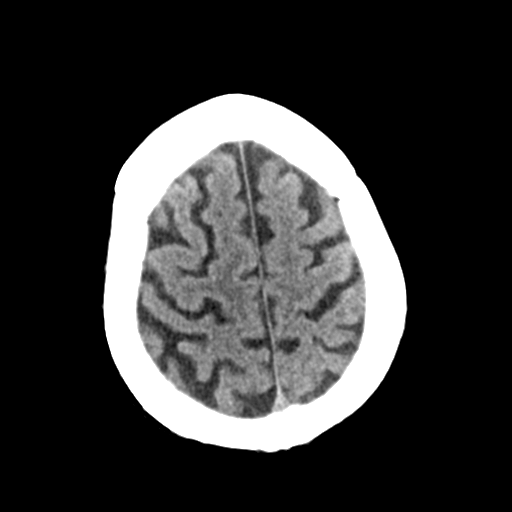
[im 29/34  brain]
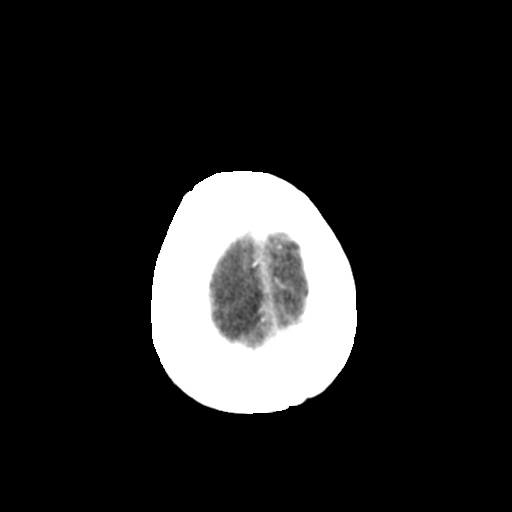

[Series 3: head bone · axial · 0.40mm/px · z∈[+519,+551]mm · 3 of 83 slices shown]
[im 9/83  bone]
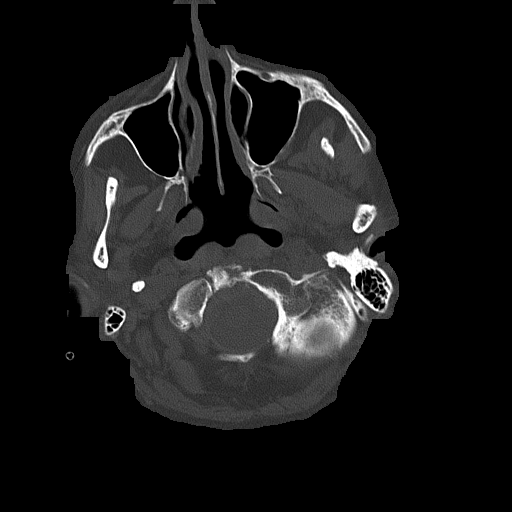
[im 17/83  bone]
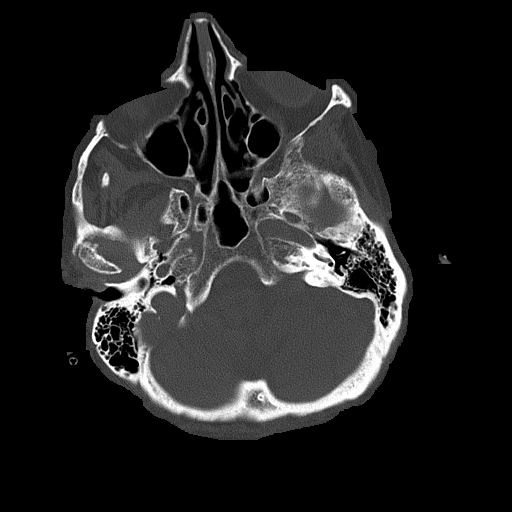
[im 25/83  bone]
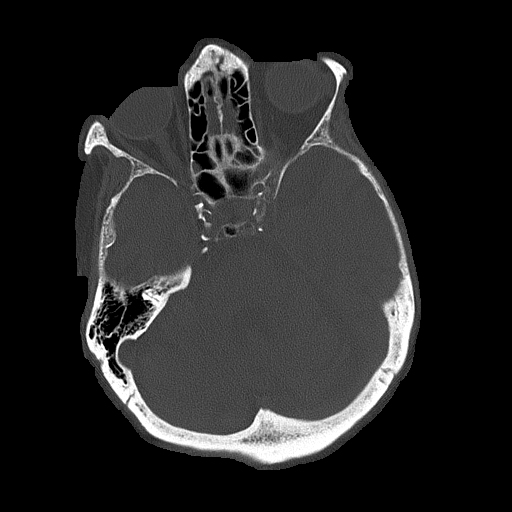

[Series 4: coronal soft tissue · coronal · 0.30mm/px · 3 of 65 slices shown]
[im 22/65  brain]
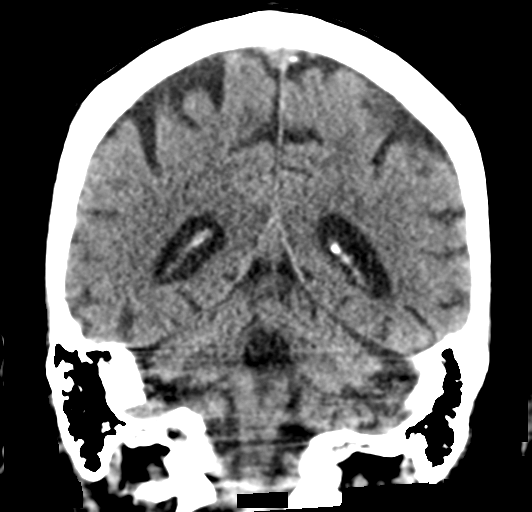
[im 29/65  brain]
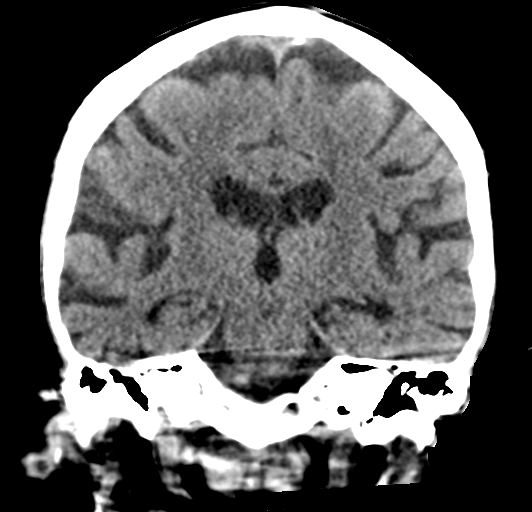
[im 36/65  brain]
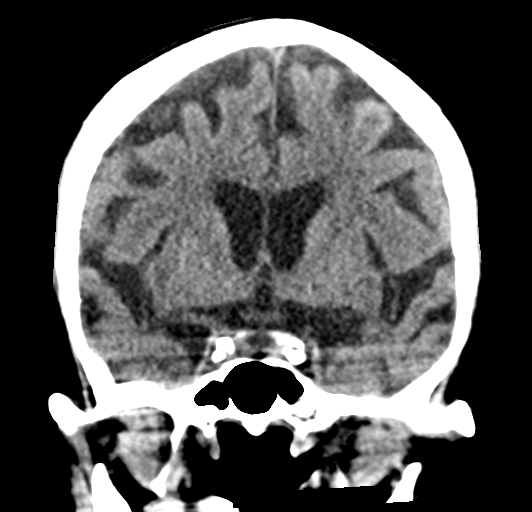

[Series 5: sagittal soft tissue · sagittal · 0.30mm/px · 3 of 54 slices shown]
[im 18/54  brain]
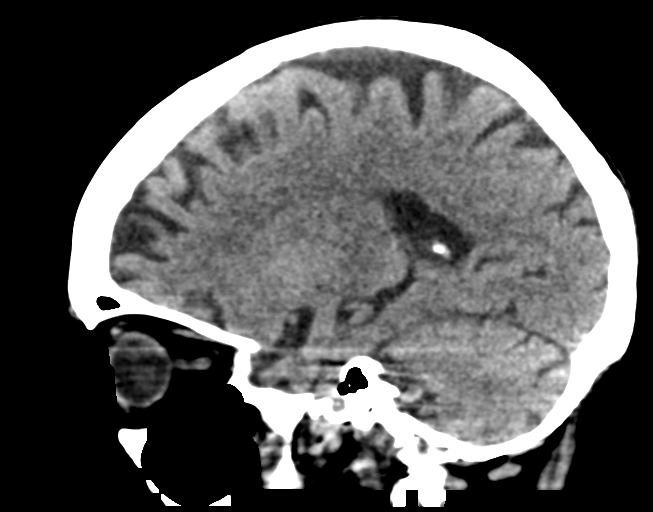
[im 27/54  brain]
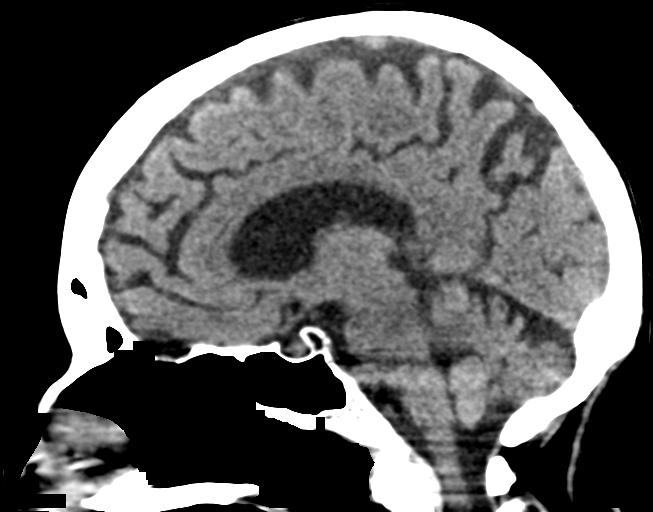
[im 36/54  brain]
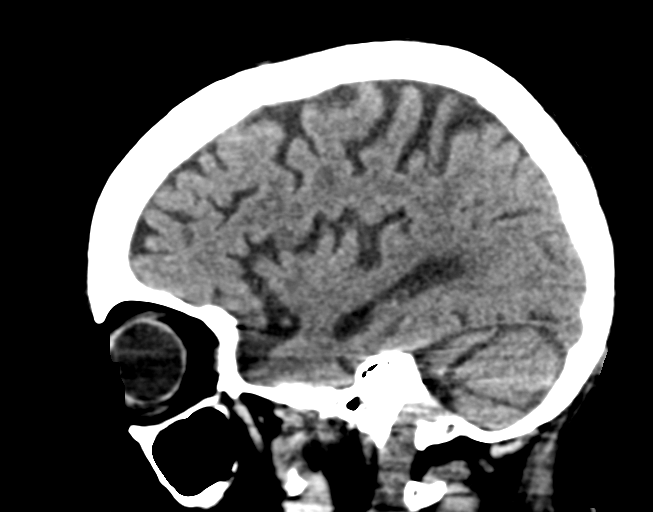

[16 of 47 positions shown; findings below may reference images not displayed]

FINDINGS: Brain: Stable atrophy pattern and chronic white matter microvascular
ischemic changes throughout both cerebral hemispheres. No acute
intracranial hemorrhage, mass lesion, new infarction, midline shift,
herniation, hydrocephalus, or extra-axial fluid collection.
Cerebellar atrophy as well.

Vascular: Intracranial atherosclerosis.  No hyperdense vessel.

Skull: Normal. Negative for fracture or focal lesion.

Sinuses/Orbits: No acute finding.

Other: None.
IMPRESSION: Stable atrophy and white matter microvascular ischemic changes.

No interval change or acute process by noncontrast CT.

## 2022-11-08 IMAGING — CT CT ABD-PELV W/O CM
2 of 4 series · 15 of 46 positions shown, 17 images · non-contrast
Comparison: None.

CLINICAL DATA: Sepsis Large necrotic looking sacral decub with
surrounding blanching redness consistent with cellulitis and foul
smell



[Series 3: axial st · axial · 0.84mm/px · z∈[-186,+184]mm · 12 of 88 slices shown, 14 images]
[im 7/88  soft-tissue]
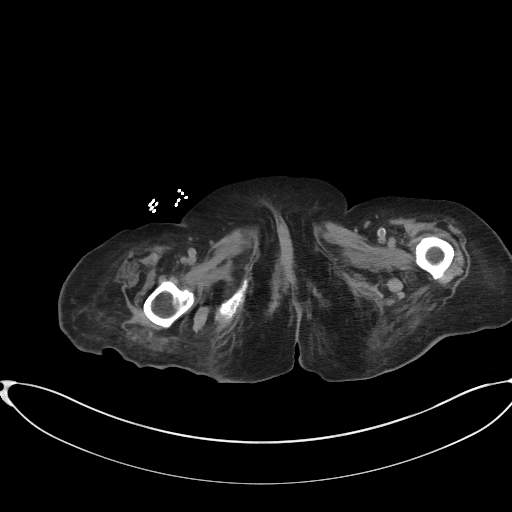
[im 7/88  bone]
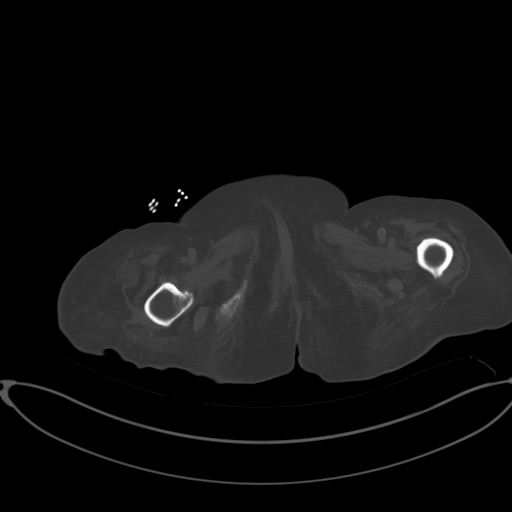
[im 14/88  soft-tissue]
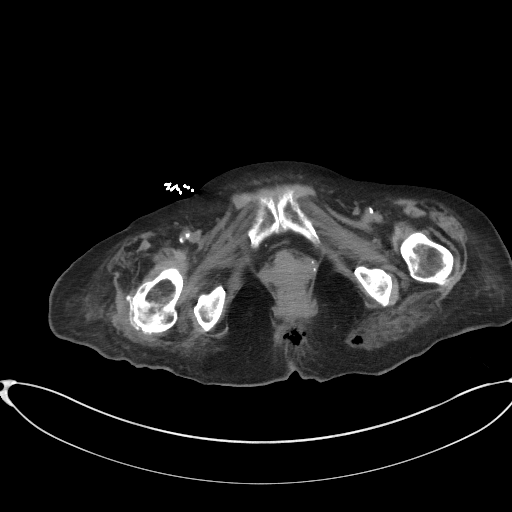
[im 21/88  soft-tissue]
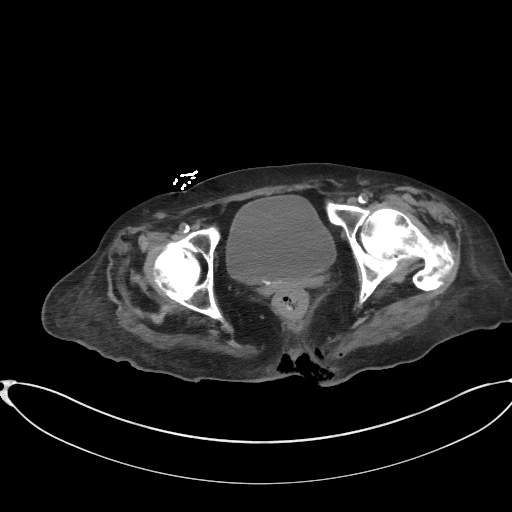
[im 27/88  soft-tissue]
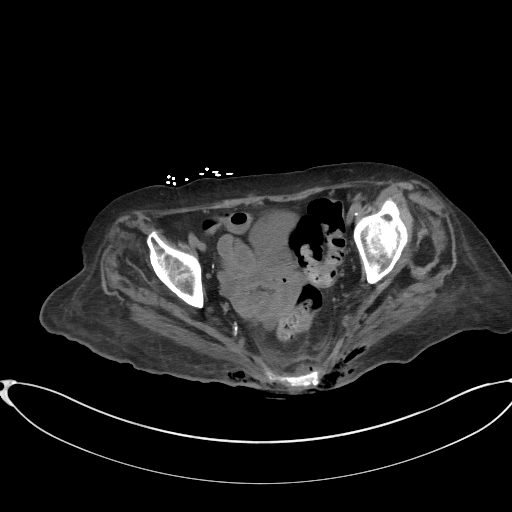
[im 34/88  soft-tissue]
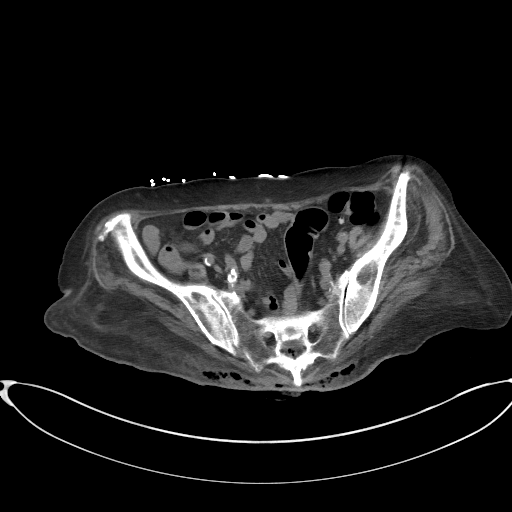
[im 41/88  soft-tissue]
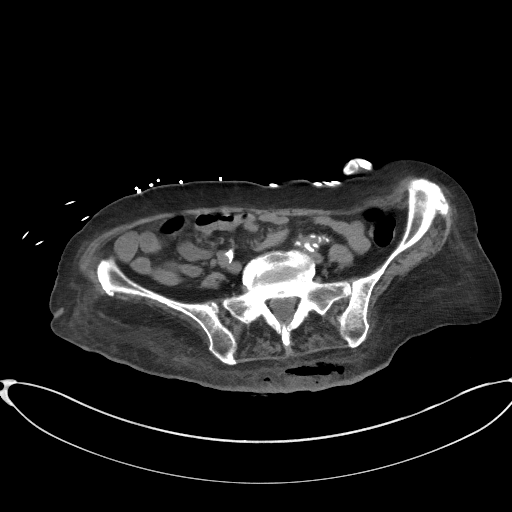
[im 47/88  soft-tissue]
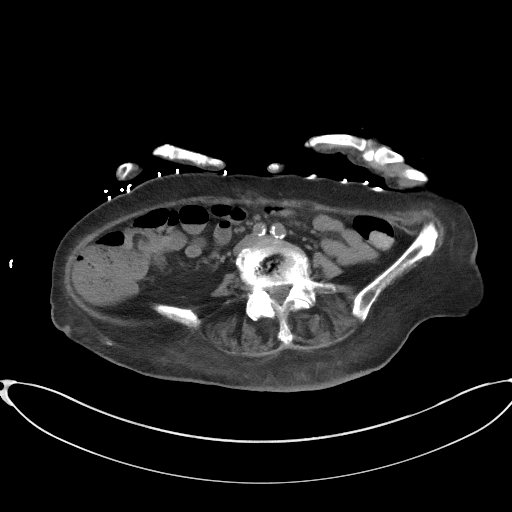
[im 54/88  soft-tissue]
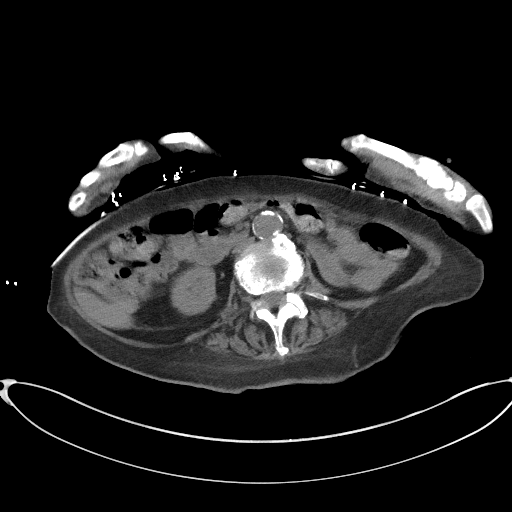
[im 61/88  soft-tissue]
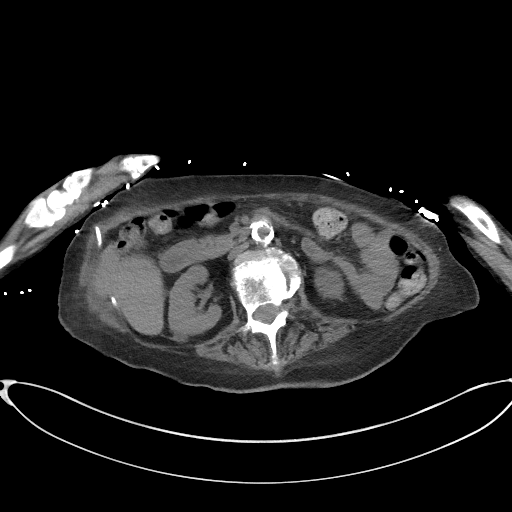
[im 61/88  bone]
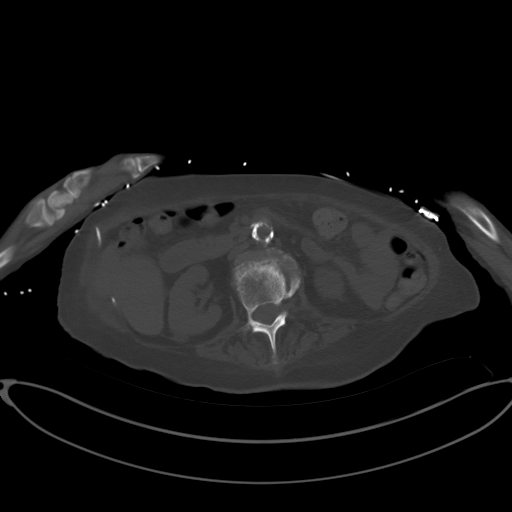
[im 67/88  soft-tissue]
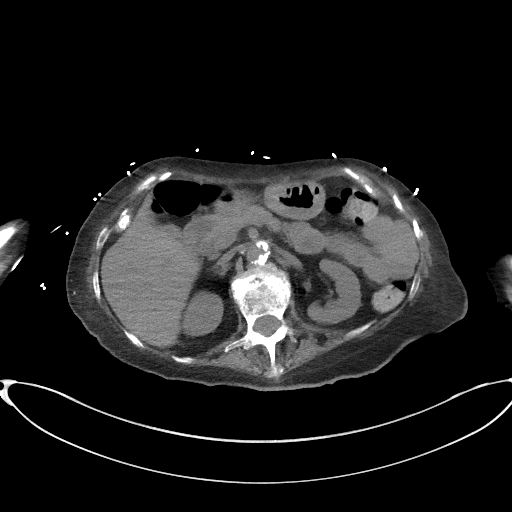
[im 74/88  soft-tissue]
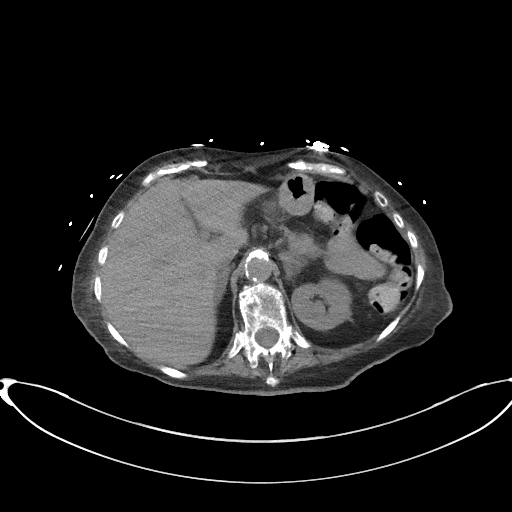
[im 81/88  soft-tissue]
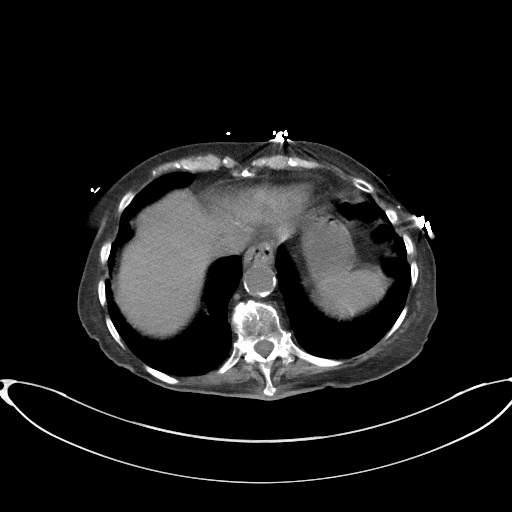

[Series 6: coronal st · coronal · 0.89mm/px · 3 of 78 slices shown]
[im 26/78  soft-tissue]
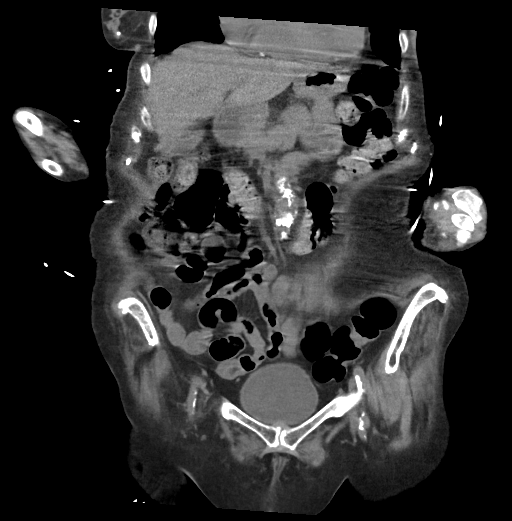
[im 35/78  soft-tissue]
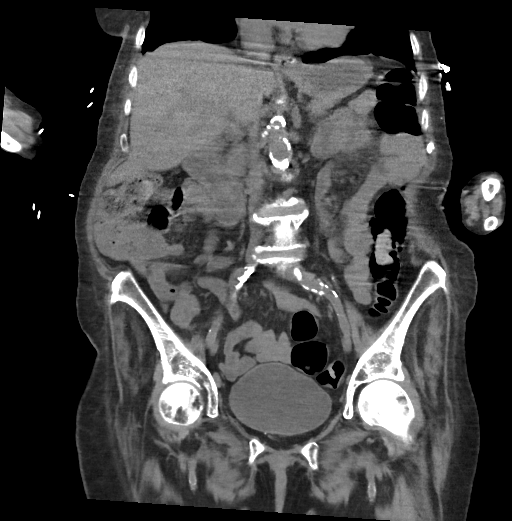
[im 43/78  soft-tissue]
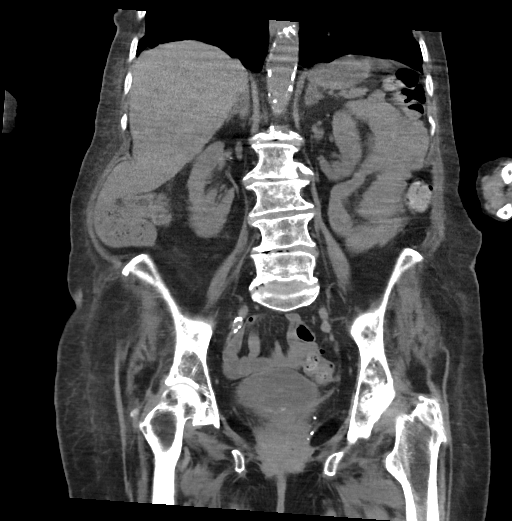

[15 of 46 positions shown; findings below may reference images not displayed]

FINDINGS: Lower chest: Bibasilar scarring and atelectasis. There is a 9 x 5 mm
(average 7 mm) nodule in the left lung base (sagittal series 7,
image 97). No acute abnormality.

Hepatobiliary: No focal liver abnormality is seen. The gallbladder
is unremarkable.

Pancreas: Unremarkable. No pancreatic ductal dilatation or
surrounding inflammatory changes.

Spleen: Normal in size without focal abnormality.

Adrenals/Urinary Tract: There is bilateral adrenal thickening with
low-density 1.2 cm nodule in the left adrenal gland, statistically
likely to be an adenoma. No hydronephrosis or nephrolithiasis. There
is mild bilateral cortical renal atrophy. There is a exophytic right
renal cyst, density consistent with a simple cyst. The bladder is
unremarkable.

Stomach/Bowel: Tiny hiatal hernia. The stomach is otherwise within
normal limits. There is no evidence of bowel obstruction.The
appendix is not definitively visualized, no right lower quadrant
inflammation/in the region of the cecum.

Vascular/Lymphatic: Aortoiliac atherosclerotic calcifications. No
AAA. No lymphadenopathy.

Reproductive: Prior hysterectomy.

Other: No bowel containing hernia.  No free air.  No ascites.

Musculoskeletal:

There is a deep sacral decubitus ulcer extending to the sacrum and
coccyx, with probable underlying osteomyelitis of S5 and the coccyx
and potentially S4 based on the proximity of the ulcer. There is no
frank bony destruction at this time but there is potential
intraosseous gas within two coccygeal bones (sagittal image 88).
There is gas extending into the sacral canal (sagittal image 86).

There is extensive subcutaneous gas extending up the back to the
level of L5-S1. Gas extends along the left gluteus maximus muscle in
there appears to be a gaseous connection coursing towards the
ischial rectal fossa in the anus (series 3, images 75-79). There is
no drainable fluid collection on noncontrast CT.

No evidence of acute fracture. Multilevel degenerative changes of
the spine, most severe at L2-L3. Moderate bilateral hip
osteoarthritis.
IMPRESSION: Deep sacral decubitus ulcer extending to the sacrum and coccyx, with
probable underlying osteomyelitis of S5 and the coccyx, and
potentially of S4, based on proximity of the ulcer. No frank bony
destruction at this time. Soft tissue gas extends into the sacral
canal to the level of S2-S3, subcutaneously to the level of L5-S1,
and laterally along the left gluteus maximus muscle. Extension of
gas towards the left ischiorectal fossa and the anus, suggesting the
possibility of a perianal fistula. There is no drainable fluid
collection on noncontrast CT.

Indeterminate 7 mm pulmonary nodule in the left lung base.
Non-contrast chest CT at 6-12 months is recommended. If the nodule
is stable at time of repeat CT, then future CT at 18-24 months (from
today's scan) is considered optional for low-risk patients, but is
recommended for high-risk patients. This recommendation follows the
consensus statement: Guidelines for Management of Incidental
Pulmonary Nodules Detected on CT Images: From the [HOSPITAL]
